# Patient Record
Sex: Male | Born: 1945
Health system: Southern US, Community
[De-identification: ages and names within clinical notes are randomized; demographics above are authoritative.]

## PROBLEM LIST (undated history)

## (undated) DIAGNOSIS — I679 Cerebrovascular disease, unspecified: Secondary | ICD-10-CM

## (undated) DIAGNOSIS — I639 Cerebral infarction, unspecified: Secondary | ICD-10-CM

## (undated) DIAGNOSIS — R51 Headache: Secondary | ICD-10-CM

## (undated) HISTORY — DX: Cerebral infarction, unspecified: I63.9

## (undated) HISTORY — DX: Cerebrovascular disease, unspecified: I67.9

## (undated) HISTORY — DX: Headache: R51

## (undated) HISTORY — PX: HERNIA REPAIR: SHX51

---

## 2005-06-09 ENCOUNTER — Ambulatory Visit: Payer: Self-pay

## 2006-12-28 ENCOUNTER — Ambulatory Visit (HOSPITAL_COMMUNITY): Admission: RE | Admit: 2006-12-28 | Discharge: 2006-12-28 | Payer: Self-pay | Admitting: General Surgery

## 2013-04-16 ENCOUNTER — Telehealth: Payer: Self-pay | Admitting: Neurology

## 2013-04-20 ENCOUNTER — Telehealth: Payer: Self-pay | Admitting: Neurology

## 2013-04-23 NOTE — Telephone Encounter (Signed)
See phone call 04/20/13 pt needs referral from PCP per [facility] hasn't been seen since 2006 by Dr. Sandria Manly. Thanks

## 2013-05-29 ENCOUNTER — Ambulatory Visit (INDEPENDENT_AMBULATORY_CARE_PROVIDER_SITE_OTHER): Payer: Medicare Other | Admitting: Family Medicine

## 2013-05-29 VITALS — BP 124/66 | HR 68 | Temp 98.8°F | Resp 18 | Ht 69.0 in | Wt 169.2 lb

## 2013-05-29 DIAGNOSIS — N529 Male erectile dysfunction, unspecified: Secondary | ICD-10-CM | POA: Diagnosis not present

## 2013-05-29 DIAGNOSIS — Z8673 Personal history of transient ischemic attack (TIA), and cerebral infarction without residual deficits: Secondary | ICD-10-CM

## 2013-05-29 MED ORDER — TADALAFIL 2.5 MG PO TABS: 2.5000 mg | ORAL_TABLET | Freq: Every day | ORAL | Status: DC

## 2013-05-29 NOTE — Progress Notes (Signed)
  Subjective:    Patient ID: Timothy Shields, male    DOB: November 28, 1945, 68 y.o.   MRN: [id number]  HPI 67 yo male presents today with a Low Libido. He was told by his wife at having difficulty having erections was a sign of imminent demise. He's had problems with his circulation the past with a stroke back in 82 and subsequently he's had several other smaller strokes. He would like a referral to a vascular specialist (Dr. Pearlean Brownie)  Review of Systems     Objective:   Physical Exam No acute distress HEENT: Unremarkable Neck: No bruits or thyromegaly, supple Chest: Clear Heart: Regular no murmur or gallop Abdomen soft nontender Femoral pulses are full without bruits there is no in the abdomen either Skin: No rashes       Assessment & Plan:  Erectile dysfunction - Plan: Tadalafil (CIALIS) 2.5 MG TABS  H/O: CVA (cerebrovascular accident) - Plan: Ambulatory referral to Vascular Surgery  Signed, Elvina Sidle, MD

## 2013-07-10 ENCOUNTER — Ambulatory Visit (INDEPENDENT_AMBULATORY_CARE_PROVIDER_SITE_OTHER): Payer: Medicare Other | Admitting: Neurology

## 2013-07-10 ENCOUNTER — Encounter (INDEPENDENT_AMBULATORY_CARE_PROVIDER_SITE_OTHER): Payer: Self-pay

## 2013-07-10 ENCOUNTER — Encounter: Payer: Self-pay | Admitting: Neurology

## 2013-07-10 VITALS — BP 111/72 | HR 66 | Temp 97.6°F | Ht 71.0 in | Wt 193.0 lb

## 2013-07-10 DIAGNOSIS — I6789 Other cerebrovascular disease: Secondary | ICD-10-CM | POA: Diagnosis not present

## 2013-07-10 DIAGNOSIS — E7849 Other hyperlipidemia: Secondary | ICD-10-CM

## 2013-07-10 DIAGNOSIS — E785 Hyperlipidemia, unspecified: Secondary | ICD-10-CM | POA: Diagnosis not present

## 2013-07-10 DIAGNOSIS — E1149 Type 2 diabetes mellitus with other diabetic neurological complication: Secondary | ICD-10-CM | POA: Diagnosis not present

## 2013-07-10 DIAGNOSIS — E782 Mixed hyperlipidemia: Secondary | ICD-10-CM | POA: Diagnosis not present

## 2013-07-10 NOTE — Progress Notes (Signed)
[facility] [address] [place]. [place] [address] [phone]       OFFICE CONSULT NOTE  Timothy. Timothy Shields Date of Birth:  [birth year] Medical Record Number:  [id number]   Referring MD:  Elvina Sidle  Reason for Referral:  History of strokes  HPI: Timothy Shields is a 67 year pleasant Caucasian male has remote history of 3 strokes going back to 1982 when he was only 67 years of age. He initially presented with right-sided ataxia and was found to have right posterior inferior cerebellar artery infarct. I do not have any of his prior records to review today. That same year he had some vision difficulties and was found to have left posterior cerebral artery infarct. He was seen at [facility] by Dr. Berdine Dance and was found to have a left PCA stenosis. He was placed on anticoagulation with warfarin and subsequently had followup catheter angiogram which showed resolution of the PCA stenosis and warfarin was discontinued. The etiology of his stroke was felt to be vasospasm of the PCA secondary to migraine. He was on aspirin and verapamil for several years and did well and eventually stopped verapamil due to constipation. On 03/29/04 while in [facility] [place] he developed right foot drop and weakness as well as right hand clumsiness without associated headache or visual changes. He was seen at [facility] in [facility] and MRI scan showed an acute left internal capsule infarct. Intra-and extracranial MRA were normal. Patient had hypercoagulable workup which was negative. He was continued on aspirin and verapamil. He has done well since then and has not had a recurrent stroke or TIA symptoms. He used to have some visual symptoms suggestive of migraine equivalents but he's not had them for several years now. He admits to mild short-term memory difficulties but denies any currents complaints. He takes aspirin only as needed and not on a regular basis. He has not had  any regular yearly health checkups or lab work. He was seeing Dr. Sandria Manly once a year and was referred to see me after his retirement.  ROS:   14 system review of systems is positive for  no complaints today  PMH:  Past Medical History  Diagnosis Date  . Stroke     3  . Headache(784.0)     Social History:  History   Social History  . Marital Status: Married    Spouse Name: N/A    Number of Children: 2  . Years of Education: MA   Occupational History  .  [employer]   Social History Main Topics  . Smoking status: Never Smoker   . Smokeless tobacco: Not on file  . Alcohol Use: 0.5 oz/week    1 drink(s) per week  . Drug Use: No  . Sexual Activity: Yes   Other Topics Concern  . Not on file   Social History Narrative  . No narrative on file    Medications:   Current Outpatient Prescriptions on File Prior to Visit  Medication Sig Dispense Refill  . Tadalafil (CIALIS) 2.5 MG TABS Take 1 tablet (2.5 mg total) by mouth daily.  30 each  5   No current facility-administered medications on file prior to visit.    Allergies:   Allergies  Allergen Reactions  . Penicillins Hives    Physical Exam General: well developed, well nourished, seated, in no evident distress Head: head normocephalic and atraumatic. Orohparynx benign Neck: supple with no carotid or supraclavicular bruits Cardiovascular:  regular rate and rhythm, no murmurs Musculoskeletal: no deformity Skin:  no rash/petichiae Vascular:  Normal pulses all extremities Filed Vitals:   07/10/13 1251  BP: 111/72  Pulse: 66  Temp: 97.6 F (36.4 C)    Neurologic Exam Mental Status: Awake and fully alert. Oriented to place and time. Recent and remote memory intact. Attention span, concentration and fund of knowledge appropriate. Mood and affect appropriate.  Cranial Nerves: Fundoscopic exam reveals sharp disc margins. Pupils equal, briskly reactive to light. Extraocular movements full without nystagmus. Visual fields  full to confrontation. Hearing intact. Facial sensation intact. Face, tongue, palate moves normally and symmetrically.  Motor: Normal bulk and tone. Normal strength in all tested extremity muscles. Sensory.: intact to touch and pinprick and vibratory sensation.  Coordination: Rapid alternating movements normal in all extremities. Finger-to-nose and heel-to-shin performed accurately bilaterally. Gait and Station: Arises from chair without difficulty. Stance is normal. Gait demonstrates normal stride length and balance . Able to heel, toe and tandem walk without difficulty.  Reflexes: 1+ and symmetric. Toes downgoing.   NIHSS  0 Modified Rankin  0  ASSESSMENT: 67 year caucasian male with remote history of 3 separate strokes- right posterior inferior cerebellar artery infarct as well as left posterior cerebral artery infarcts both  in 1982 followed by left internal capsule infarct in 2005. Etiology unclear most likely cryptogenic given absence of significant vascular risk factors and 2 of his strokes occurred when he was in his 30s. He has done well without any recurrent neurovascular symptoms in the last 9 years. No significant vascular risk factors except possibly mild hyperlipidemia.    PLAN: I had a long discussion with the patient with regards to his prior strokes and went over the results of the evaluation and answered questions. Since he is doing so well without any neurovascular symptoms for 9 years I would not recommend any further diagnostic workup for his cryptogenic etiology. If it does have episodes of palpitations or further there was a simple A. consider placement of loop recorder for of cisplatin positioned. I strongly encouraged him to take aspirin 325 mg daily as well as check fasting lipid profile, and hemoglobin A1c. Make start him on statin if his lipids are borderline or elevated. Also check complete metabolic panel and CBC as he has not had any lab work in recent years. Check a  screening carotid ultrasound yearly for carotid stenosis. Follow up in the future in 1 year or call earlier if needed.

## 2013-07-10 NOTE — Patient Instructions (Signed)
Start aspirin 325 mg daily. Exercise regularly 30 minutes 4-5 times per week. Check complete metabolic panel, CBC, hemoglobin A1c and fasting lipid profile. Followup once a year as needed

## 2013-07-26 ENCOUNTER — Encounter: Payer: Self-pay | Admitting: Family Medicine

## 2013-08-08 ENCOUNTER — Ambulatory Visit (INDEPENDENT_AMBULATORY_CARE_PROVIDER_SITE_OTHER): Payer: Medicare Other

## 2013-08-08 DIAGNOSIS — R0989 Other specified symptoms and signs involving the circulatory and respiratory systems: Secondary | ICD-10-CM

## 2013-08-08 DIAGNOSIS — I6789 Other cerebrovascular disease: Secondary | ICD-10-CM

## 2013-08-09 ENCOUNTER — Encounter: Payer: Medicare Other | Admitting: Family Medicine

## 2013-09-13 ENCOUNTER — Encounter: Payer: Self-pay | Admitting: Family Medicine

## 2013-09-13 ENCOUNTER — Ambulatory Visit (INDEPENDENT_AMBULATORY_CARE_PROVIDER_SITE_OTHER): Payer: Medicare Other | Admitting: Family Medicine

## 2013-09-13 VITALS — BP 120/70 | HR 60 | Temp 97.9°F | Resp 16 | Ht 69.0 in | Wt 191.4 lb

## 2013-09-13 DIAGNOSIS — Z139 Encounter for screening, unspecified: Secondary | ICD-10-CM | POA: Diagnosis not present

## 2013-09-13 DIAGNOSIS — N529 Male erectile dysfunction, unspecified: Secondary | ICD-10-CM | POA: Diagnosis not present

## 2013-09-13 DIAGNOSIS — Z0000 Encounter for general adult medical examination without abnormal findings: Secondary | ICD-10-CM | POA: Diagnosis not present

## 2013-09-13 LAB — POCT URINALYSIS DIPSTICK
Bilirubin, UA: NEGATIVE
Blood, UA: NEGATIVE
Glucose, UA: NEGATIVE
Ketones, UA: NEGATIVE
Leukocytes, UA: NEGATIVE
Nitrite, UA: NEGATIVE
Protein, UA: NEGATIVE
Spec Grav, UA: >=1.03
Urobilinogen, UA: 0.2
pH, UA: 6

## 2013-09-13 LAB — IFOBT (OCCULT BLOOD): IFOBT: NEGATIVE

## 2013-09-13 MED ORDER — TADALAFIL 2.5 MG PO TABS: 2.5000 mg | ORAL_TABLET | Freq: Every day | ORAL | Status: DC

## 2013-09-13 NOTE — Progress Notes (Signed)
   Subjective:    Patient ID: ELIGE SHOUSE, male    DOB: [birth year], 68 y.o.   MRN: [id number]  HPI Josef Tourigny is a 68 year old gentleman who now works half days. 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 He has copies of books such as Shakespeare and Darwin's Origin of the Species.  He had a cerebro vascular accident in 1982 thought to have arisen from a silent migraine syndrome. He's had several subsequent episodes but none in recent years. His residual deficits include right upper quadrant visual loss, right leg turning out, and slightly weak right grasp. He was originally treated with Coumadin but this made him depressed and he subsequently switched over to plain aspirin. He was seen by [facility] in consultation with Dr. love, who retired recently and he is now being seen by Dr. Willaim Rayas.  He is due for a colonoscopy. He has not made arrangements with Dr. Fuller Plan yet.  Review of Systems  Constitutional: Negative.   HENT: Negative.   Eyes: Negative.   Respiratory: Negative.   Cardiovascular: Negative.   Gastrointestinal: Negative.   Endocrine: Negative.   Genitourinary: Negative.   Musculoskeletal: Negative.   Skin: Negative.   Allergic/Immunologic: Positive for environmental allergies.  Neurological: Negative.   Hematological: Negative.   Psychiatric/Behavioral: Negative.        Objective:   Physical Exam HEENT: Normal with good fundi, clear ear canals are normal TMs, normal oropharynx. Neck: No bruit, no adenopathy, no thyromegaly Chest: Clear Heart: Regular no murmur Abdomen: Soft nontender without HSM or masses. He does have a very small umbilical hernia Genitalia: Circumcised male with no hernia, scrotal mass. Rectal exam: Weekened sphincter, several small hemorrhoids, diffusely enlarged and smooth prostate. Extremities: Good pulses, no edema Results for orders placed in visit  on 09/13/13  IFOBT (OCCULT BLOOD)      Result Value Range   IFOBT Negative    POCT URINALYSIS DIPSTICK      Result Value Range   Color, UA yellow     Clarity, UA clear     Glucose, UA neg     Bilirubin, UA neg     Ketones, UA neg     Spec Grav, UA >=1.030     Blood, UA neg     pH, UA 6.0     Protein, UA neg     Urobilinogen, UA 0.2     Nitrite, UA neg     Leukocytes, UA Negative         Assessment & Plan:  Patient is due for a colonoscopy. He'll be having his routine lab done by Dr. Willaim Rayas.  I asked patient to include comprehensive metabolic profile, PSA, lipid profile, CBC, sedimentation rate when he has his lab done with Dr. Willaim Rayas.  Routine general medical examination at a health care facility - Plan: IFOBT POC (occult bld, rslt in office), POCT urinalysis dipstick  Erectile dysfunction - Plan: Tadalafil (CIALIS) 2.5 MG TABS   Signed, Carola Frost.D.

## 2013-10-09 ENCOUNTER — Other Ambulatory Visit (INDEPENDENT_AMBULATORY_CARE_PROVIDER_SITE_OTHER): Payer: Self-pay

## 2013-10-09 ENCOUNTER — Other Ambulatory Visit: Payer: Self-pay | Admitting: *Deleted

## 2013-10-09 DIAGNOSIS — Z125 Encounter for screening for malignant neoplasm of prostate: Secondary | ICD-10-CM

## 2013-10-09 DIAGNOSIS — N529 Male erectile dysfunction, unspecified: Secondary | ICD-10-CM

## 2013-10-09 DIAGNOSIS — Z0000 Encounter for general adult medical examination without abnormal findings: Secondary | ICD-10-CM

## 2013-10-09 DIAGNOSIS — Z0289 Encounter for other administrative examinations: Secondary | ICD-10-CM

## 2013-10-10 ENCOUNTER — Other Ambulatory Visit (INDEPENDENT_AMBULATORY_CARE_PROVIDER_SITE_OTHER): Payer: Self-pay

## 2013-10-10 DIAGNOSIS — Z0289 Encounter for other administrative examinations: Secondary | ICD-10-CM

## 2013-10-10 DIAGNOSIS — E782 Mixed hyperlipidemia: Secondary | ICD-10-CM | POA: Diagnosis not present

## 2013-10-10 DIAGNOSIS — Z125 Encounter for screening for malignant neoplasm of prostate: Secondary | ICD-10-CM

## 2013-10-10 DIAGNOSIS — E1149 Type 2 diabetes mellitus with other diabetic neurological complication: Secondary | ICD-10-CM | POA: Diagnosis not present

## 2013-10-10 DIAGNOSIS — I6789 Other cerebrovascular disease: Secondary | ICD-10-CM | POA: Diagnosis not present

## 2013-10-11 LAB — COMPREHENSIVE METABOLIC PANEL
ALT: 14 IU/L (ref 0–44)
AST: 18 IU/L (ref 0–40)
Albumin/Globulin Ratio: 1.8 (ref 1.1–2.5)
Albumin: 4.4 g/dL (ref 3.6–4.8)
Alkaline Phosphatase: 59 IU/L (ref 39–117)
BUN/Creatinine Ratio: 23 — ABNORMAL HIGH (ref 10–22)
BUN: 26 mg/dL (ref 8–27)
CO2: 24 mmol/L (ref 18–29)
Calcium: 9.5 mg/dL (ref 8.6–10.2)
Chloride: 103 mmol/L (ref 97–108)
Creatinine, Ser: 1.15 mg/dL (ref 0.76–1.27)
GFR calc Af Amer: 75 mL/min/{1.73_m2} (ref >59)
GFR calc non Af Amer: 65 mL/min/{1.73_m2} (ref >59)
Globulin, Total: 2.5 g/dL (ref 1.5–4.5)
Glucose: 84 mg/dL (ref 65–99)
Potassium: 4.9 mmol/L (ref 3.5–5.2)
Sodium: 143 mmol/L (ref 134–144)
Total Bilirubin: 1 mg/dL (ref 0.0–1.2)
Total Protein: 6.9 g/dL (ref 6.0–8.5)

## 2013-10-11 LAB — LIPID PANEL
Chol/HDL Ratio: 3.1 ratio units (ref 0.0–5.0)
Cholesterol, Total: 198 mg/dL (ref 100–199)
HDL: 63 mg/dL (ref >39)
LDL Calculated: 122 mg/dL — ABNORMAL HIGH (ref 0–99)
Triglycerides: 63 mg/dL (ref 0–149)
VLDL Cholesterol Cal: 13 mg/dL (ref 5–40)

## 2013-10-11 LAB — CBC
HCT: 44.9 % (ref 37.5–51.0)
Hemoglobin: 15.5 g/dL (ref 12.6–17.7)
MCH: 33 pg (ref 26.6–33.0)
MCHC: 34.5 g/dL (ref 31.5–35.7)
MCV: 96 fL (ref 79–97)
Platelets: 205 10*3/uL (ref 150–379)
RBC: 4.69 x10E6/uL (ref 4.14–5.80)
RDW: 13.6 % (ref 12.3–15.4)
WBC: 3.5 10*3/uL (ref 3.4–10.8)

## 2013-10-11 LAB — PSA: PSA: 1.2 ng/mL (ref 0.0–4.0)

## 2013-10-11 LAB — HEMOGLOBIN A1C
Est. average glucose Bld gHb Est-mCnc: 123 mg/dL
Hgb A1c MFr Bld: 5.9 % — ABNORMAL HIGH (ref 4.8–5.6)

## 2013-10-19 ENCOUNTER — Telehealth: Payer: Self-pay | Admitting: *Deleted

## 2013-10-19 NOTE — Telephone Encounter (Signed)
Called patient gave blood work results and doppler sent results to primary.

## 2013-10-22 ENCOUNTER — Encounter: Payer: Self-pay | Admitting: *Deleted

## 2013-12-12 ENCOUNTER — Encounter: Payer: Self-pay | Admitting: Family Medicine

## 2014-01-10 NOTE — Telephone Encounter (Signed)
Closing encounter

## 2014-01-21 ENCOUNTER — Telehealth: Payer: Self-pay | Admitting: Family Medicine

## 2014-01-21 DIAGNOSIS — N529 Male erectile dysfunction, unspecified: Secondary | ICD-10-CM

## 2014-01-21 NOTE — Telephone Encounter (Signed)
Patient needs a refill on Cialis 5mg . [facility] [place]  [phone] extension [phone]

## 2014-01-23 MED ORDER — TADALAFIL 5 MG PO TABS: 5.0000 mg | ORAL_TABLET | Freq: Every day | ORAL | Status: DC | PRN

## 2014-01-23 NOTE — Telephone Encounter (Signed)
Patient had been taking 2.5 mg but I will fill 5 mg tabs as requested

## 2014-03-21 DIAGNOSIS — D1739 Benign lipomatous neoplasm of skin and subcutaneous tissue of other sites: Secondary | ICD-10-CM | POA: Diagnosis not present

## 2014-03-21 DIAGNOSIS — D239 Other benign neoplasm of skin, unspecified: Secondary | ICD-10-CM | POA: Diagnosis not present

## 2014-03-21 DIAGNOSIS — L821 Other seborrheic keratosis: Secondary | ICD-10-CM | POA: Diagnosis not present

## 2014-03-21 DIAGNOSIS — L819 Disorder of pigmentation, unspecified: Secondary | ICD-10-CM | POA: Diagnosis not present

## 2014-11-25 ENCOUNTER — Other Ambulatory Visit: Payer: Self-pay | Admitting: Family Medicine

## 2014-11-27 NOTE — Telephone Encounter (Signed)
It has been over a yr since you saw pt. Do you want to deny?

## 2015-11-29 ENCOUNTER — Other Ambulatory Visit: Payer: Self-pay | Admitting: Family Medicine

## 2015-12-18 ENCOUNTER — Other Ambulatory Visit: Payer: Self-pay | Admitting: Family Medicine

## 2015-12-26 ENCOUNTER — Ambulatory Visit (INDEPENDENT_AMBULATORY_CARE_PROVIDER_SITE_OTHER): Payer: Medicare Other | Admitting: Family Medicine

## 2015-12-26 VITALS — BP 122/76 | HR 60 | Temp 98.0°F | Resp 18 | Wt 205.0 lb

## 2015-12-26 DIAGNOSIS — Z23 Encounter for immunization: Secondary | ICD-10-CM | POA: Diagnosis not present

## 2015-12-26 DIAGNOSIS — Z0000 Encounter for general adult medical examination without abnormal findings: Secondary | ICD-10-CM

## 2015-12-26 DIAGNOSIS — N529 Male erectile dysfunction, unspecified: Secondary | ICD-10-CM

## 2015-12-26 MED ORDER — TADALAFIL 5 MG PO TABS: 5.0000 mg | ORAL_TABLET | Freq: Every day | ORAL | Status: DC | PRN

## 2015-12-26 MED ORDER — ZOSTER VACCINE LIVE 19400 UNT/0.65ML ~~LOC~~ SOLR: 0.6500 mL | Freq: Once | SUBCUTANEOUS | Status: DC

## 2015-12-26 NOTE — Addendum Note (Signed)
Addended by: Robyn Haber on: 12/26/2015 07:32 PM   Modules accepted: Orders

## 2015-12-26 NOTE — Patient Instructions (Addendum)

## 2015-12-26 NOTE — Progress Notes (Addendum)
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 He's going to be seeing Dr. Dagmar Hait for his medical care in the future.  Patient has an aversion to medical care. I have referred him for colonoscopy in the past and he's refused to go. He's about to go on a trip to [place] and said at this point he would consider doing the colonoscopy when he gets back.  Patient needs a refill on his Cialis.  I talked about patient's past medical history of having had a stroke with some residual effects of having a right upper quadrant field cut and his vision as well as his right foot turning out. He's exercising twice a week although his lead is weight it away from him a little bit in the last year. He's walking 10,000 steps a day now. He's going to be working on losing weight.  I try to talk patient into getting a tetanus shot and a pneumovax today but he only would accept the pneumonia vaccine. He said he'll get a shingles vaccine at the pharmacy so I prescribed that as well.  I spoke with him about cholesterol screening.  He said he would never take a statin.  He hopes to shed 10 lbs.  He refused the PSA screening.  He said he would get his eyes checked for glaucoma.  He has not seen an ophthalmologist in years.  He has not had follow up for his stroke since Dr. Erling Cruz retired.  Patient is a first 2 taking cholesterol medication.  Objective:  Cheerful, articulate BP 122/76 mmHg  Pulse 60  Temp(Src) 98 F (36.7 C) (Oral)  Resp 18  Wt 205 lb (92.987 kg)  SpO2 96% He has a right upper field cut.  Good strength in both upper extremities and gait is normal  Assessment:  Patient is somewhat phobic of needles and does not trust the medical profession to guide him on preventive care.  Erectile dysfunction, unspecified erectile dysfunction type - Plan: tadalafil (CIALIS) 5 MG tablet  Routine general medical examination at a health care facility - Plan: Ambulatory referral to  Gastroenterology, Pneumococcal conjugate vaccine 13-valent IM  Need for prophylactic vaccination against Streptococcus pneumoniae (pneumococcus) - Plan: Pneumococcal conjugate vaccine 13-valent IM  Need for shingles vaccine - Plan: zoster vaccine live, PF, (ZOSTAVAX) 09811 UNT/0.65ML injection  I suggested he see Dr. Dagmar Hait to review appropriate labs since he may have a different menu than what I would recommend and he has such an aversion to needles.  Robyn Haber M.D.

## 2016-03-31 ENCOUNTER — Encounter: Payer: Self-pay | Admitting: Family Medicine

## 2016-08-17 DIAGNOSIS — I639 Cerebral infarction, unspecified: Secondary | ICD-10-CM | POA: Diagnosis not present

## 2016-08-17 DIAGNOSIS — E784 Other hyperlipidemia: Secondary | ICD-10-CM | POA: Diagnosis not present

## 2016-08-17 DIAGNOSIS — F5221 Male erectile disorder: Secondary | ICD-10-CM | POA: Diagnosis not present

## 2016-08-17 DIAGNOSIS — Z6828 Body mass index (BMI) 28.0-28.9, adult: Secondary | ICD-10-CM | POA: Diagnosis not present

## 2016-08-17 DIAGNOSIS — Z1389 Encounter for screening for other disorder: Secondary | ICD-10-CM | POA: Diagnosis not present

## 2016-12-07 DIAGNOSIS — Z125 Encounter for screening for malignant neoplasm of prostate: Secondary | ICD-10-CM | POA: Diagnosis not present

## 2016-12-07 DIAGNOSIS — E784 Other hyperlipidemia: Secondary | ICD-10-CM | POA: Diagnosis not present

## 2016-12-07 DIAGNOSIS — Z0000 Encounter for general adult medical examination without abnormal findings: Secondary | ICD-10-CM | POA: Diagnosis not present

## 2016-12-14 DIAGNOSIS — F5221 Male erectile disorder: Secondary | ICD-10-CM | POA: Diagnosis not present

## 2016-12-14 DIAGNOSIS — Z0000 Encounter for general adult medical examination without abnormal findings: Secondary | ICD-10-CM | POA: Diagnosis not present

## 2016-12-14 DIAGNOSIS — Z1389 Encounter for screening for other disorder: Secondary | ICD-10-CM | POA: Diagnosis not present

## 2016-12-14 DIAGNOSIS — Z6828 Body mass index (BMI) 28.0-28.9, adult: Secondary | ICD-10-CM | POA: Diagnosis not present

## 2016-12-14 DIAGNOSIS — I639 Cerebral infarction, unspecified: Secondary | ICD-10-CM | POA: Diagnosis not present

## 2016-12-14 DIAGNOSIS — E784 Other hyperlipidemia: Secondary | ICD-10-CM | POA: Diagnosis not present

## 2016-12-16 DIAGNOSIS — Z1212 Encounter for screening for malignant neoplasm of rectum: Secondary | ICD-10-CM | POA: Diagnosis not present

## 2016-12-24 DIAGNOSIS — Z1211 Encounter for screening for malignant neoplasm of colon: Secondary | ICD-10-CM | POA: Diagnosis not present

## 2016-12-24 DIAGNOSIS — Z1212 Encounter for screening for malignant neoplasm of rectum: Secondary | ICD-10-CM | POA: Diagnosis not present

## 2017-03-15 ENCOUNTER — Encounter: Payer: Self-pay | Admitting: Internal Medicine

## 2017-11-14 ENCOUNTER — Other Ambulatory Visit: Payer: Self-pay | Admitting: Radiology

## 2019-07-18 DIAGNOSIS — H2511 Age-related nuclear cataract, right eye: Secondary | ICD-10-CM | POA: Diagnosis not present

## 2019-07-18 DIAGNOSIS — H25812 Combined forms of age-related cataract, left eye: Secondary | ICD-10-CM | POA: Diagnosis not present

## 2019-10-03 ENCOUNTER — Ambulatory Visit: Payer: Self-pay

## 2019-10-12 ENCOUNTER — Ambulatory Visit: Payer: Medicare Other | Attending: Internal Medicine

## 2019-10-12 DIAGNOSIS — Z23 Encounter for immunization: Secondary | ICD-10-CM

## 2019-10-12 NOTE — Progress Notes (Signed)
   Covid-19 Vaccination Clinic  Name:  Timothy Shields    MRN: [id number] DOB: 1946/03/16  10/12/2019  Timothy Shields was observed post Covid-19 immunization for 15 minutes without incidence. He was provided with Vaccine Information Sheet and instruction to access the V-Safe system.   Timothy Shields was instructed to call 911 with any severe reactions post vaccine: Marland Kitchen Difficulty breathing  . Swelling of your face and throat  . A fast heartbeat  . A bad rash all over your body  . Dizziness and weakness    Immunizations Administered    Name Date Dose VIS Date Route   Pfizer COVID-19 Vaccine 10/12/2019 10:57 AM 0.3 mL 08/17/2019 Intramuscular   Manufacturer: [place]   Lot: [id number]   [place]: [id number]

## 2019-10-24 ENCOUNTER — Ambulatory Visit: Payer: Self-pay

## 2019-11-06 ENCOUNTER — Ambulatory Visit: Payer: Medicare Other | Attending: Internal Medicine

## 2019-11-06 DIAGNOSIS — Z23 Encounter for immunization: Secondary | ICD-10-CM | POA: Insufficient documentation

## 2019-11-06 NOTE — Progress Notes (Signed)
   Covid-19 Vaccination Clinic  Name:  Timothy Shields    MRN: [id number] DOB: 04/08/1946  11/06/2019  Timothy Shields was observed post Covid-19 immunization for 15 minutes without incident. He was provided with Vaccine Information Sheet and instruction to access the V-Safe system.   [name] was instructed to call 911 with any severe reactions post vaccine: Marland Kitchen Difficulty breathing  . Swelling of face and throat  . A fast heartbeat  . A bad rash all over body  . Dizziness and weakness   Immunizations Administered    Name Date Dose VIS Date Route   Pfizer COVID-19 Vaccine 11/06/2019 10:43 AM 0.3 mL 08/17/2019 Intramuscular   Manufacturer: [place]   Lot: [place]   [place]: [id number]

## 2020-06-26 ENCOUNTER — Encounter: Payer: Self-pay | Admitting: Neurology

## 2020-06-26 ENCOUNTER — Ambulatory Visit (INDEPENDENT_AMBULATORY_CARE_PROVIDER_SITE_OTHER): Payer: Medicare Other | Admitting: Neurology

## 2020-06-26 ENCOUNTER — Telehealth: Payer: Self-pay | Admitting: Neurology

## 2020-06-26 VITALS — BP 142/90 | HR 66 | Ht 70.0 in | Wt 186.2 lb

## 2020-06-26 DIAGNOSIS — R269 Unspecified abnormalities of gait and mobility: Secondary | ICD-10-CM | POA: Diagnosis not present

## 2020-06-26 DIAGNOSIS — R413 Other amnesia: Secondary | ICD-10-CM | POA: Diagnosis not present

## 2020-06-26 DIAGNOSIS — Z8673 Personal history of transient ischemic attack (TIA), and cerebral infarction without residual deficits: Secondary | ICD-10-CM

## 2020-06-26 NOTE — Patient Instructions (Signed)
I had a long discussion with the patient with regards to his complaints of memory loss and gait difficulty with dragging of his right leg as well as remote history of strokes and answered questions.  I recommend we do further evaluation by checking memory panel labs, neuropathy panel labs, EMG nerve conduction study, MRI of the brain and cervical spine and check lipid profile and hemoglobin A1c.  I recommend he start aspirin 81 mg daily for stroke prevention and maintain aggressive risk factor modification with strict control of hypertension with blood pressure goal below 130/90, lipids with LDL cholesterol goal below 70 mg percent and diabetes with hemoglobin A1c goal below 6.5%.  I encouraged him to increase participation in cognitively challenging activities like solving crossword puzzles, playing bridge and sodoku.  We also discussed memory compensation strategies.  He will return for follow-up in the future in 2 months or call earlier if necessary. Memory Compensation Strategies  1. Use "WARM" strategy.  W= write it down  A= associate it  R= repeat it  M= make a mental note  2.   You can keep a Social worker.  Use a 3-ring notebook with sections for the following: calendar, important names and phone numbers,  medications, doctors' names/phone numbers, lists/reminders, and a section to journal what you did  each day.   3.    Use a calendar to write appointments down.  4.    Write yourself a schedule for the day.  This can be placed on the calendar or in a separate section of the Memory Notebook.  Keeping a  regular schedule can help memory.  5.    Use medication organizer with sections for each day or morning/evening pills.  You may need help loading it  6.    Keep a basket, or pegboard by the door.  Place items that you need to take out with you in the basket or on the pegboard.  You may also want to  include a message board for reminders.  7.    Use sticky notes.  Place sticky notes  with reminders in a place where the task is performed.  For example: " turn off the  stove" placed by the stove, "lock the door" placed on the door at eye level, " take your medications" on  the bathroom mirror or by the place where you normally take your medications.  8.    Use alarms/timers.  Use while cooking to remind yourself to check on food or as a reminder to take your medicine, or as a  reminder to make a call, or as a reminder to perform another task, etc.
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
## 2020-06-26 NOTE — Telephone Encounter (Signed)
Medicare/bcbs supp order sent to GI. No auth they will reach out to the patient to schedule.  

## 2020-06-27 LAB — NEUROPATHY PANEL: Total Protein: 7.1 g/dL (ref 6.0–8.5)

## 2020-06-27 LAB — RPR: RPR Ser Ql: NONREACTIVE

## 2020-06-27 NOTE — Progress Notes (Signed)
Kindly inform the patient that all the lab work is not back yet but vitamin D levels are low and cholesterol profile is not satisfactory and to see primary care physician for vitamin D replacement and cholesterol medication.

## 2020-06-30 LAB — NEUROPATHY PANEL
A/G Ratio: 1.3 (ref 0.7–1.7)
Albumin ELP: 4 g/dL (ref 2.9–4.4)
Alpha 1: 0.2 g/dL (ref 0.0–0.4)
Alpha 2: 0.6 g/dL (ref 0.4–1.0)
Angio Convert Enzyme: 39 U/L (ref 14–82)
Anti Nuclear Antibody (ANA): NEGATIVE
Beta: 0.9 g/dL (ref 0.7–1.3)
Gamma Globulin: 1.4 g/dL (ref 0.4–1.8)
Globulin, Total: 3.1 g/dL (ref 2.2–3.9)
Rhuematoid fact SerPl-aCnc: <10 IU/mL (ref 0.0–13.9)
Sed Rate: 15 mm/hr (ref 0–30)
TSH: 1.75 u[IU]/mL (ref 0.450–4.500)
Vit D, 25-Hydroxy: 24.8 ng/mL — ABNORMAL LOW (ref 30.0–100.0)
Vitamin B-12: 299 pg/mL (ref 232–1245)

## 2020-06-30 LAB — LIPID PANEL
Chol/HDL Ratio: 3.7 ratio (ref 0.0–5.0)
Cholesterol, Total: 214 mg/dL — ABNORMAL HIGH (ref 100–199)
HDL: 58 mg/dL (ref >39)
LDL Chol Calc (NIH): 144 mg/dL — ABNORMAL HIGH (ref 0–99)
Triglycerides: 66 mg/dL (ref 0–149)
VLDL Cholesterol Cal: 12 mg/dL (ref 5–40)

## 2020-06-30 LAB — HEMOGLOBIN A1C
Est. average glucose Bld gHb Est-mCnc: 111 mg/dL
Hgb A1c MFr Bld: 5.5 % (ref 4.8–5.6)

## 2020-07-02 ENCOUNTER — Telehealth: Payer: Self-pay | Admitting: Emergency Medicine

## 2020-07-02 NOTE — Telephone Encounter (Signed)
LVM on patient's mobile number.  Home number is disconnected and work number had voice mail full.

## 2020-07-02 NOTE — Telephone Encounter (Signed)
-----   Message from Garvin Fila, MD sent at 06/27/2020 12:25 PM EDT ----- Mitchell Heir inform the patient that all the lab work is not back yet but vitamin D levels are low and cholesterol profile is not satisfactory and to see primary care physician for vitamin D replacement and cholesterol medication.

## 2020-07-03 ENCOUNTER — Telehealth: Payer: Self-pay | Admitting: Emergency Medicine

## 2020-07-03 NOTE — Telephone Encounter (Signed)
Called patient and discussed Dr. Clydene Fake review of blood work.  Patient was able to verbalize his Vitamin D level was low and his cholesterol panel was not WNL.  Patient stated he would call his PCP and make an appointment to have this managed.    Patient verbalized appreciation.

## 2020-07-03 NOTE — Telephone Encounter (Signed)
Pt. returned [place], RN call. Please call.

## 2020-07-03 NOTE — Telephone Encounter (Signed)
Called patient and discussed Dr. Clydene Fake review of blood work and recommendations.  Patient denied any questions and verbalized undertanding to call his PCP for management of low Vitamin D and cholesterol.  Patient expressed appreciation.

## 2020-07-03 NOTE — Telephone Encounter (Signed)
-----   Message from Garvin Fila, MD sent at 06/27/2020 12:25 PM EDT ----- Mitchell Heir inform the patient that all the lab work is not back yet but vitamin D levels are low and cholesterol profile is not satisfactory and to see primary care physician for vitamin D replacement and cholesterol medication.

## 2020-07-08 ENCOUNTER — Telehealth: Payer: Self-pay | Admitting: Emergency Medicine

## 2020-07-08 NOTE — Progress Notes (Signed)
Kindly inform the patient that lab work for inflammatory and vasculitic causes of neuropathy was all normal

## 2020-07-08 NOTE — Telephone Encounter (Signed)
Called and spoke to patient regarding lab results.  Patient had no questions and expressed appreciation.

## 2020-07-08 NOTE — Telephone Encounter (Signed)
-----   Message from Garvin Fila, MD sent at 07/08/2020  8:37 AM EDT ----- Mitchell Heir inform the patient that lab work for inflammatory and vasculitic causes of neuropathy was all normal

## 2020-07-10 ENCOUNTER — Encounter (INDEPENDENT_AMBULATORY_CARE_PROVIDER_SITE_OTHER): Payer: Medicare Other | Admitting: Diagnostic Neuroimaging

## 2020-07-10 ENCOUNTER — Other Ambulatory Visit: Payer: Self-pay

## 2020-07-10 ENCOUNTER — Ambulatory Visit (INDEPENDENT_AMBULATORY_CARE_PROVIDER_SITE_OTHER): Payer: Medicare Other | Admitting: Diagnostic Neuroimaging

## 2020-07-10 DIAGNOSIS — Z0289 Encounter for other administrative examinations: Secondary | ICD-10-CM

## 2020-07-10 DIAGNOSIS — R269 Unspecified abnormalities of gait and mobility: Secondary | ICD-10-CM

## 2020-07-10 DIAGNOSIS — R2689 Other abnormalities of gait and mobility: Secondary | ICD-10-CM

## 2020-07-10 NOTE — Procedures (Signed)
[facility]  NCS (NERVE CONDUCTION STUDY) WITH EMG (ELECTROMYOGRAPHY) REPORT   STUDY DATE: 07/10/20 PATIENT NAME: Timothy Shields DOB: [birth year] MRN: [id number]  ORDERING CLINICIAN: Antony Contras, MD   TECHNOLOGIST: Sherre Scarlet ELECTROMYOGRAPHER: Earlean Polka. Pacey Altizer, MD  CLINICAL INFORMATION: 74 year old male with lower extremity weakness.  FINDINGS: NERVE CONDUCTION STUDY:  Bilateral peroneal and tibial motor responses are normal.  Bilateral sural and superficial peroneal sensory responses are normal.  Bilateral tibial F wave latencies are borderline prolonged.   NEEDLE ELECTROMYOGRAPHY:  Needle examination of right lower extremity is normal.    IMPRESSION:   This study demonstrates: -Overall unremarkable nerve conduction study and needle EMG.  No underlying evidence of large fiber polyneuropathy.    INTERPRETING PHYSICIAN:  Penni Bombard, MD Certified in Neurology, Neurophysiology and Neuroimaging  [facility] [address] [place], [place] [address] [phone]  [facility]    Nerve / Sites Muscle Latency Ref. Amplitude Ref. Rel Amp Segments Distance Velocity Ref. Area    ms ms mV mV %  cm m/s m/s mVms  R Peroneal - EDB     Ankle EDB 5.2 ?6.5 5.3 ?2.0 100 Ankle - EDB 9   19.5     Fib head EDB 12.0  4.2  78.9 Fib head - Ankle 30 44 ?44 17.4     Pop fossa EDB 14.3  3.4  81.2 Pop fossa - Fib head 10 44 ?44 14.7         Pop fossa - Ankle      L Peroneal - EDB     Ankle EDB 5.3 ?6.5 5.8 ?2.0 100 Ankle - EDB 9   20.2     Fib head EDB 12.1  4.4  75.8 Fib head - Ankle 30 44 ?44 17.7     Pop fossa EDB 14.4  4.3  96.7 Pop fossa - Fib head 10 44 ?44 18.4         Pop fossa - Ankle      R Tibial - AH     Ankle AH 5.5 ?5.8 5.0 ?4.0 100 Ankle - AH 9   15.1     Pop fossa AH 15.2  4.3  85.2 Pop fossa - Ankle 40 41 ?41 16.9  L Tibial - AH     Ankle AH 4.7 ?5.8 8.6 ?4.0 100 Ankle - AH 9   24.6     Pop fossa AH 14.5  7.1   82.7 Pop fossa - Ankle 40 41 ?41 26.1             SNC    Nerve / Sites Rec. Site Peak Lat Ref.  Amp Ref. Segments Distance    ms ms V V  cm  R Sural - Ankle (Calf)     Calf Ankle 4.3 ?4.4 14 ?6 Calf - Ankle 14  L Sural - Ankle (Calf)     Calf Ankle 4.4 ?4.4 13 ?6 Calf - Ankle 14  R Superficial peroneal - Ankle     Lat leg Ankle 4.2 ?4.4 7 ?6 Lat leg - Ankle 14  L Superficial peroneal - Ankle     Lat leg Ankle 4.3 ?4.4 6 ?6 Lat leg - Ankle 14             F  Wave    Nerve F Lat Ref.   ms ms  R Tibial - AH 59.7 ?56.0  L Tibial - AH 57.0 ?56.0  EMG Summary Table    Spontaneous MUAP Recruitment  Muscle IA Fib PSW Fasc Other Amp Dur. Poly Pattern  R. Vastus medialis Normal None None None _______ Normal Normal Normal Normal  R. Tibialis anterior Normal None None None _______ Normal Normal Normal Normal  R. Gastrocnemius (Medial head) Normal None None None _______ Normal Normal Normal Normal

## 2020-07-17 NOTE — Progress Notes (Signed)
Kindly inform the patient that EMG nerve conduction study was normal.  No evidence of pinched nerve or neuropathy

## 2020-07-22 ENCOUNTER — Encounter: Payer: Self-pay | Admitting: *Deleted

## 2020-11-10 DIAGNOSIS — M25561 Pain in right knee: Secondary | ICD-10-CM | POA: Diagnosis not present

## 2021-01-15 DIAGNOSIS — E785 Hyperlipidemia, unspecified: Secondary | ICD-10-CM | POA: Diagnosis not present

## 2021-01-15 DIAGNOSIS — Z125 Encounter for screening for malignant neoplasm of prostate: Secondary | ICD-10-CM | POA: Diagnosis not present

## 2021-01-22 DIAGNOSIS — I639 Cerebral infarction, unspecified: Secondary | ICD-10-CM | POA: Diagnosis not present

## 2021-01-22 DIAGNOSIS — Z1212 Encounter for screening for malignant neoplasm of rectum: Secondary | ICD-10-CM | POA: Diagnosis not present

## 2021-01-22 DIAGNOSIS — F5221 Male erectile disorder: Secondary | ICD-10-CM | POA: Diagnosis not present

## 2021-01-22 DIAGNOSIS — R859 Unspecified abnormal finding in specimens from digestive organs and abdominal cavity: Secondary | ICD-10-CM | POA: Diagnosis not present

## 2021-01-22 DIAGNOSIS — Z0000 Encounter for general adult medical examination without abnormal findings: Secondary | ICD-10-CM | POA: Diagnosis not present

## 2021-01-22 DIAGNOSIS — E785 Hyperlipidemia, unspecified: Secondary | ICD-10-CM | POA: Diagnosis not present

## 2021-05-13 DIAGNOSIS — Z23 Encounter for immunization: Secondary | ICD-10-CM | POA: Diagnosis not present

## 2021-05-26 DIAGNOSIS — Z23 Encounter for immunization: Secondary | ICD-10-CM | POA: Diagnosis not present

## 2021-07-06 DIAGNOSIS — M545 Low back pain, unspecified: Secondary | ICD-10-CM | POA: Diagnosis not present

## 2021-07-20 DIAGNOSIS — H25813 Combined forms of age-related cataract, bilateral: Secondary | ICD-10-CM | POA: Diagnosis not present

## 2021-09-10 ENCOUNTER — Encounter: Payer: Self-pay | Admitting: *Deleted

## 2021-09-11 ENCOUNTER — Ambulatory Visit (INDEPENDENT_AMBULATORY_CARE_PROVIDER_SITE_OTHER): Payer: Medicare Other | Admitting: Internal Medicine

## 2021-09-11 ENCOUNTER — Encounter: Payer: Self-pay | Admitting: Internal Medicine

## 2021-09-11 VITALS — BP 128/72 | HR 64 | Ht 69.0 in | Wt 187.0 lb

## 2021-09-11 DIAGNOSIS — R1110 Vomiting, unspecified: Secondary | ICD-10-CM

## 2021-09-11 DIAGNOSIS — K219 Gastro-esophageal reflux disease without esophagitis: Secondary | ICD-10-CM | POA: Diagnosis not present

## 2021-09-11 DIAGNOSIS — R0989 Other specified symptoms and signs involving the circulatory and respiratory systems: Secondary | ICD-10-CM

## 2021-09-11 MED ORDER — PANTOPRAZOLE SODIUM 40 MG PO TBEC: 40.0000 mg | DELAYED_RELEASE_TABLET | Freq: Every day | ORAL | 1 refills | Status: DC

## 2021-09-11 NOTE — Patient Instructions (Signed)
We have sent the following medications to your pharmacy for you to pick up at your convenience: Pantoprazole 40 mg once daily x 4-6 weeks.  Please send a Mychart message in approximately 1 month with an update on how your symptoms are after taking the pantoprazole.  If you are age 77 or older, your body mass index should be between 23-30. Your Body mass index is 27.62 kg/m. If this is out of the aforementioned range listed, please consider follow up with your Primary Care Provider.  If you are age 22 or younger, your body mass index should be between 19-25. Your Body mass index is 27.62 kg/m. If this is out of the aformentioned range listed, please consider follow up with your Primary Care Provider.   ________________________________________________________  The [facility] providers would like to encourage you to use [facility] to communicate with providers for non-urgent requests or questions.  Due to long hold times on the telephone, sending your provider a message by [facility] may be a faster and more efficient way to get a response.  Please allow 48 business hours for a response.  Please remember that this is for non-urgent requests.  _______________________________________________________ Due to recent changes in healthcare laws, you may see the results of your imaging and laboratory studies on MyChart before your provider has had a chance to review them.  We understand that in some cases there may be results that are confusing or concerning to you. Not all laboratory results come back in the same time frame and the provider may be waiting for multiple results in order to interpret others.  Please give [place] 48 hours in order for your provider to thoroughly review all the results before contacting the office for clarification of your results.

## 2021-09-11 NOTE — Progress Notes (Signed)
Patient ID: Timothy Shields, male   DOB: 24-Oct-1945, 76 y.o.   MRN: [id number] HPI: Timothy Shields is a 76 year old male with a history of stroke who was seen to evaluate throat clearing symptoms and intermittent nocturnal choking type symptoms.  He is here with his wife today.  He has minimal history though he states he will be seeing Dr. Leonie Man soon to be evaluated for possible Parkinson's disease.  He has not had this diagnosis of yet.  He reports that over the last few years he has noticed throat clearing and issues with mucus in the back of his throat.  He does not have a sore throat.  There is been no frequent cough.  He reports no issues with swallowing including no solid or liquid food dysphagia.  He does not feel heartburn or pyrosis.  He does intermittently have issues with hiccups perhaps more of late.  He is also had several incidences where he will wake up with a choking or coughing type symptom from sleep.  No abdominal pain.  No change in bowel movement.  No blood in stool or melena.  He reports his colorectal cancer screening has been done by primary care by using Cologuard.  He reports being up-to-date with this and his tests have been negative.  There is no family history of colorectal cancer.  His mother had throat cancer.  He has a son who is diabetic.  He is married with 2 adult children.  He worked in Estate manager/land agent.  Never a tobacco user.  Less than 1 alcoholic beverage per day on average.  Past Medical History:  Diagnosis Date   Cerebrovascular disease    Headache(784.0)    Stroke ([place])    3    Past Surgical History:  Procedure Laterality Date   HERNIA REPAIR     HERNIA REPAIR     Twice in the 1970's    Outpatient Medications Prior to Visit  Medication Sig Dispense Refill   aspirin EC 81 MG tablet Take 81 mg by mouth daily. Swallow whole.     tadalafil (CIALIS) 5 MG tablet Take 1 tablet (5 mg total) by mouth daily as needed for erectile dysfunction. 30  tablet 11   zoster vaccine live, PF, (ZOSTAVAX) 29562 UNT/0.65ML injection Inject 19,400 Units into the skin once. 0.65 mL 0   No facility-administered medications prior to visit.    Allergies  Allergen Reactions   Penicillins Hives    Family History  Problem Relation Age of Onset   Cancer Mother    Parkinson's disease Mother    Cancer Father    Heart disease Father    Diabetes Son    Cerebral palsy Son     Social History   Tobacco Use   Smoking status: Never   Smokeless tobacco: Never  Substance Use Topics   Alcohol use: Yes    Alcohol/week: 1.0 standard drink    Types: 1 Standard drinks or equivalent per week    Comment: 2-3 drinks   Drug use: No    ROS: As per history of present illness, otherwise negative  BP 128/72    Pulse 64    Ht 5\' 9"  (1.753 m)    Wt 187 lb (84.8 kg)    BMI 27.62 kg/m  Constitutional: Well-developed and well-nourished. No distress. HEENT: Normocephalic and atraumatic. Oropharynx is clear and moist.  No scleral icterus.  Neck is supple without masses. Cardiovascular: Normal rate, regular rhythm and intact distal pulses. No M/R/G  Pulmonary/chest: Effort normal and breath sounds normal. No wheezing, rales or rhonchi. Abdominal: Soft, nontender, nondistended. Bowel sounds active throughout. There are no masses palpable. No hepatosplenomegaly. Extremities: no clubbing, cyanosis, or edema Neurological: Alert and oriented to person place and time. Skin: Skin is warm and dry.  Psychiatric: Normal mood and affect. Behavior is normal.  ASSESSMENT/PLAN: 76 year old male with a history of stroke who was seen to evaluate throat clearing symptoms and intermittent nocturnal choking type symptoms.  Throat clearing/nocturnal regurgitation/LPR --we discussed his symptoms today and I am suspicious that they may relate to reflux disease specifically more LPR as he does not have esophageal symptoms.  We spent time today discussing how this can lead to  oropharyngeal irritation.  Without definitive alarm symptom I do not think we need to immediately perform upper endoscopy.  After discussing we will proceed as follows: --Trial of pantoprazole 40 mg once daily for 4 to 6 weeks; I asked him to reassess symptoms in about a month and send me a Scientist, research (medical).  If symptoms improved then diagnosis made. --If symptoms fail to improve consider barium esophagram/upper GI series which she would prefer less invasive maneuver before consideration of upper endoscopy  2.  Colorectal cancer screening --patient reports previous use of Cologuard for which she is up-to-date      Cc:[address], [place],  [place] [address]

## 2021-09-16 ENCOUNTER — Encounter: Payer: Self-pay | Admitting: Neurology

## 2021-09-16 ENCOUNTER — Other Ambulatory Visit: Payer: Self-pay

## 2021-09-16 ENCOUNTER — Ambulatory Visit (INDEPENDENT_AMBULATORY_CARE_PROVIDER_SITE_OTHER): Payer: Medicare Other | Admitting: Neurology

## 2021-09-16 ENCOUNTER — Telehealth: Payer: Self-pay | Admitting: Neurology

## 2021-09-16 VITALS — BP 118/82 | HR 69 | Ht 69.5 in | Wt 186.0 lb

## 2021-09-16 DIAGNOSIS — G214 Vascular parkinsonism: Secondary | ICD-10-CM

## 2021-09-16 DIAGNOSIS — R269 Unspecified abnormalities of gait and mobility: Secondary | ICD-10-CM | POA: Diagnosis not present

## 2021-09-16 DIAGNOSIS — G3184 Mild cognitive impairment, so stated: Secondary | ICD-10-CM | POA: Diagnosis not present

## 2021-09-16 DIAGNOSIS — R251 Tremor, unspecified: Secondary | ICD-10-CM | POA: Diagnosis not present
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
## 2021-09-16 NOTE — Telephone Encounter (Signed)
Medicare order sent to [facility], [facility] they will reach out to the patient to schedule.  °

## 2021-09-16 NOTE — Patient Instructions (Addendum)
I had a long discussion with the patient and his wife regarding his subacute symptoms of walking difficulty, tremors, hypophonia which possibly represented poststroke parkinsonism but do appear to be improving and not disabling at the present time to justify trial of medication.  Recommend further evaluation with checking MRI scan of the brain and cervical spine to rule out interval new strokes as well as compressive lesions.  Continue aspirin for stroke prevention and maintain aggressive risk factor modification strict control of hypertension blood pressure goal below 140/90, lipids with LDL cholesterol goal below 70 mg percent.  He will return for follow-up in 3 to 4 months or call earlier if necessary.

## 2021-09-17 ENCOUNTER — Other Ambulatory Visit: Payer: Self-pay | Admitting: Neurology

## 2021-09-17 ENCOUNTER — Telehealth: Payer: Self-pay | Admitting: Neurology

## 2021-09-17 DIAGNOSIS — R269 Unspecified abnormalities of gait and mobility: Secondary | ICD-10-CM

## 2021-09-17 NOTE — Telephone Encounter (Signed)
Melrose Nakayama, MD left a vm @ 2:47 stating pt would like a MRI of his lower back. Melrose Nakayama, MD has concerns pt has Spinal stenosis in addition to Parkinson's.   Dr Rhona Raider believes it is easier if Dr Leonie Man just orders the MRI instead of him, Dr Rhona Raider is asking to be called if there are questions or concerns.  He can be reached at [phone]

## 2021-09-18 ENCOUNTER — Telehealth: Payer: Self-pay | Admitting: Neurology

## 2021-09-18 NOTE — Telephone Encounter (Signed)
Medicare order sent to [facility], [facility] they will reach out to the patient to schedule.  °

## 2021-09-24 ENCOUNTER — Encounter: Payer: Self-pay | Admitting: Internal Medicine

## 2021-09-28 ENCOUNTER — Institutional Professional Consult (permissible substitution): Payer: Medicare Other | Admitting: Neurology

## 2021-10-03 ENCOUNTER — Other Ambulatory Visit: Payer: Self-pay | Admitting: Internal Medicine

## 2021-10-03 ENCOUNTER — Ambulatory Visit
Admission: RE | Admit: 2021-10-03 | Discharge: 2021-10-03 | Disposition: A | Discharge status: Home / Self Care | Payer: Medicare Other | Source: Ambulatory Visit | Attending: Neurology | Admitting: Neurology

## 2021-10-03 ENCOUNTER — Other Ambulatory Visit: Payer: Self-pay

## 2021-10-03 DIAGNOSIS — R269 Unspecified abnormalities of gait and mobility: Secondary | ICD-10-CM | POA: Diagnosis not present

## 2021-10-03 MED ORDER — GADOBENATE DIMEGLUMINE 529 MG/ML IV SOLN
17.0000 mL | Freq: Once | INTRAVENOUS | Status: AC | PRN
  Administered 2021-10-03: 17 mL via INTRAVENOUS

## 2021-10-05 ENCOUNTER — Telehealth: Payer: Self-pay | Admitting: Neurology

## 2021-10-05 NOTE — Telephone Encounter (Signed)
Pt called wanting to know when he will be called to discuss the MRI results. Please advise.

## 2021-10-06 ENCOUNTER — Encounter: Payer: Self-pay | Admitting: Internal Medicine

## 2021-10-06 ENCOUNTER — Telehealth: Payer: Self-pay | Admitting: Neurology

## 2021-10-06 NOTE — Telephone Encounter (Signed)
I attempted to reach the patient's wife again. Left message for her to return Dr. Clydene Fake call.  Timothy Shields - [phone]

## 2021-10-06 NOTE — Telephone Encounter (Signed)
I spoke to the patient and gave him results of the MRI scan of the brain showing no new abnormalities but old left PCA and right cerebellar infarcts and mild changes of small vessel disease.  MRI scan of the cervical spine shows spinal stenosis at C5-6 with moderate left-sided foraminal narrowing and MRI of the lumbar spine also shows mild degenerative changes.  Patient informed me that he had seen gastroenterologist Dr. Hilarie Fredrickson who gave him an antireflux medication which seems to have helped his swallowing.  I believe the patient's stiffness and gait difficulties are likely combination from his old strokes as well as spinal stenosis and does not have Parkinson's disease.  He did not recommend any changes in his neurological medications at this time.  He was advised to follow-up with me in 4 to 6 months or call earlier if necessary.  He voiced understanding

## 2021-10-07 NOTE — Progress Notes (Signed)
I spoke yesterday with the patient and his wife and communicated results of the brain MRI scan as well as spine MRIs and answered questions

## 2021-10-07 NOTE — Progress Notes (Signed)
I spoke to the patient yesterday and gave results of the MRI scan of the cervical and lumbar spine and answered questions.

## 2021-10-15 ENCOUNTER — Encounter: Payer: Self-pay | Admitting: Internal Medicine

## 2021-10-23 ENCOUNTER — Encounter: Payer: Self-pay | Admitting: Internal Medicine

## 2021-10-23 ENCOUNTER — Other Ambulatory Visit: Payer: Self-pay

## 2021-10-23 DIAGNOSIS — R1310 Dysphagia, unspecified: Secondary | ICD-10-CM

## 2021-10-23 NOTE — Telephone Encounter (Signed)
Please let patient know that I received his MyChart message  I have the following thoughts: I do think he should continue the pantoprazole 40 mg in the morning and famotidine 40 mg about 30 minutes before bedtime I recommend that we proceed with a barium esophagram with tablet to further evaluate for reflux and esophageal abnormalities; depending on the results of this we may consider upper endoscopy I feel that it would be okay to add the low-dose alprazolam/Xanax as prescribed by his primary care provider.  He should take this very close to bedtime as it can be sedating but it should help with his anxiousness and allow for better sleeping.  We will be back in touch with him once we get the barium esophagram results

## 2021-10-26 ENCOUNTER — Encounter: Payer: Self-pay | Admitting: Internal Medicine

## 2021-10-30 ENCOUNTER — Ambulatory Visit (HOSPITAL_COMMUNITY)
Admission: RE | Admit: 2021-10-30 | Discharge: 2021-10-30 | Disposition: A | Discharge status: Home / Self Care | Payer: Medicare Other | Source: Ambulatory Visit | Attending: Internal Medicine | Admitting: Internal Medicine

## 2021-10-30 ENCOUNTER — Encounter: Payer: Self-pay | Admitting: Internal Medicine

## 2021-10-30 ENCOUNTER — Other Ambulatory Visit: Payer: Self-pay

## 2021-10-30 DIAGNOSIS — K224 Dyskinesia of esophagus: Secondary | ICD-10-CM | POA: Diagnosis not present

## 2021-10-30 DIAGNOSIS — R1310 Dysphagia, unspecified: Secondary | ICD-10-CM | POA: Diagnosis not present

## 2021-10-31 ENCOUNTER — Other Ambulatory Visit: Payer: Self-pay | Admitting: Internal Medicine

## 2021-11-03 ENCOUNTER — Other Ambulatory Visit: Payer: Self-pay

## 2021-11-03 MED ORDER — PANTOPRAZOLE SODIUM 40 MG PO TBEC: 40.0000 mg | DELAYED_RELEASE_TABLET | Freq: Two times a day (BID) | ORAL | 3 refills | Status: DC

## 2021-11-06 DIAGNOSIS — R1310 Dysphagia, unspecified: Secondary | ICD-10-CM | POA: Diagnosis not present

## 2021-11-06 DIAGNOSIS — R2681 Unsteadiness on feet: Secondary | ICD-10-CM | POA: Diagnosis not present

## 2021-11-06 DIAGNOSIS — I639 Cerebral infarction, unspecified: Secondary | ICD-10-CM | POA: Diagnosis not present

## 2021-11-06 DIAGNOSIS — F5221 Male erectile disorder: Secondary | ICD-10-CM | POA: Diagnosis not present

## 2021-11-06 DIAGNOSIS — R859 Unspecified abnormal finding in specimens from digestive organs and abdominal cavity: Secondary | ICD-10-CM | POA: Diagnosis not present

## 2021-11-06 DIAGNOSIS — G4701 Insomnia due to medical condition: Secondary | ICD-10-CM | POA: Diagnosis not present

## 2021-11-06 DIAGNOSIS — F410 Panic disorder [episodic paroxysmal anxiety] without agoraphobia: Secondary | ICD-10-CM | POA: Diagnosis not present

## 2021-11-06 DIAGNOSIS — E785 Hyperlipidemia, unspecified: Secondary | ICD-10-CM | POA: Diagnosis not present

## 2021-11-11 DIAGNOSIS — D2262 Melanocytic nevi of left upper limb, including shoulder: Secondary | ICD-10-CM | POA: Diagnosis not present

## 2021-11-11 DIAGNOSIS — D2272 Melanocytic nevi of left lower limb, including hip: Secondary | ICD-10-CM | POA: Diagnosis not present

## 2021-11-11 DIAGNOSIS — D1801 Hemangioma of skin and subcutaneous tissue: Secondary | ICD-10-CM | POA: Diagnosis not present

## 2021-11-11 DIAGNOSIS — D2271 Melanocytic nevi of right lower limb, including hip: Secondary | ICD-10-CM | POA: Diagnosis not present

## 2021-11-11 DIAGNOSIS — L821 Other seborrheic keratosis: Secondary | ICD-10-CM | POA: Diagnosis not present

## 2021-11-11 DIAGNOSIS — D2261 Melanocytic nevi of right upper limb, including shoulder: Secondary | ICD-10-CM | POA: Diagnosis not present

## 2021-11-11 DIAGNOSIS — D225 Melanocytic nevi of trunk: Secondary | ICD-10-CM | POA: Diagnosis not present

## 2021-11-11 DIAGNOSIS — D1721 Benign lipomatous neoplasm of skin and subcutaneous tissue of right arm: Secondary | ICD-10-CM | POA: Diagnosis not present

## 2021-11-11 DIAGNOSIS — E785 Hyperlipidemia, unspecified: Secondary | ICD-10-CM | POA: Diagnosis not present

## 2022-01-04 DIAGNOSIS — Z23 Encounter for immunization: Secondary | ICD-10-CM | POA: Diagnosis not present

## 2022-01-31 ENCOUNTER — Other Ambulatory Visit: Payer: Self-pay | Admitting: Internal Medicine

## 2022-02-03 ENCOUNTER — Encounter: Payer: Self-pay | Admitting: Neurology

## 2022-02-03 ENCOUNTER — Ambulatory Visit (INDEPENDENT_AMBULATORY_CARE_PROVIDER_SITE_OTHER): Payer: Medicare Other | Admitting: Neurology

## 2022-02-03 VITALS — BP 131/79 | HR 66 | Ht 69.6 in | Wt 179.2 lb

## 2022-02-03 DIAGNOSIS — Z8673 Personal history of transient ischemic attack (TIA), and cerebral infarction without residual deficits: Secondary | ICD-10-CM | POA: Diagnosis not present

## 2022-02-03 DIAGNOSIS — R269 Unspecified abnormalities of gait and mobility: Secondary | ICD-10-CM | POA: Diagnosis not present

## 2022-02-03 NOTE — Patient Instructions (Signed)
I had a long discussion with the patient and his wife regarding his gait difficulties at likely multifactorial due to combination of his remote strokes as well as degenerative arthritis and mild spinal stenosis.  I advised him to get up slowly and avoid making sudden and quick movements.  Continue aspirin for stroke prevention and maintain aggressive risk factor modification with strict control of hypertension with blood pressure goal below 140/90, lipids with LDL cholesterol goal below 70 mg percent and diabetes with hemoglobin A1c goal below 6.5%.  He will return for follow-up in the future in 6 months to a year or call earlier if necessary.
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
## 2022-02-15 DIAGNOSIS — E785 Hyperlipidemia, unspecified: Secondary | ICD-10-CM | POA: Diagnosis not present

## 2022-02-15 DIAGNOSIS — R7989 Other specified abnormal findings of blood chemistry: Secondary | ICD-10-CM | POA: Diagnosis not present

## 2022-02-15 DIAGNOSIS — Z125 Encounter for screening for malignant neoplasm of prostate: Secondary | ICD-10-CM | POA: Diagnosis not present

## 2022-02-22 DIAGNOSIS — R2681 Unsteadiness on feet: Secondary | ICD-10-CM | POA: Diagnosis not present

## 2022-02-22 DIAGNOSIS — I639 Cerebral infarction, unspecified: Secondary | ICD-10-CM | POA: Diagnosis not present

## 2022-02-22 DIAGNOSIS — F410 Panic disorder [episodic paroxysmal anxiety] without agoraphobia: Secondary | ICD-10-CM | POA: Diagnosis not present

## 2022-02-22 DIAGNOSIS — R82998 Other abnormal findings in urine: Secondary | ICD-10-CM | POA: Diagnosis not present

## 2022-02-22 DIAGNOSIS — G4701 Insomnia due to medical condition: Secondary | ICD-10-CM | POA: Diagnosis not present

## 2022-02-22 DIAGNOSIS — Z0000 Encounter for general adult medical examination without abnormal findings: Secondary | ICD-10-CM | POA: Diagnosis not present

## 2022-02-22 DIAGNOSIS — R859 Unspecified abnormal finding in specimens from digestive organs and abdominal cavity: Secondary | ICD-10-CM | POA: Diagnosis not present

## 2022-02-22 DIAGNOSIS — Z23 Encounter for immunization: Secondary | ICD-10-CM | POA: Diagnosis not present

## 2022-02-22 DIAGNOSIS — F5221 Male erectile disorder: Secondary | ICD-10-CM | POA: Diagnosis not present

## 2022-02-22 DIAGNOSIS — R1310 Dysphagia, unspecified: Secondary | ICD-10-CM | POA: Diagnosis not present

## 2022-02-22 DIAGNOSIS — E785 Hyperlipidemia, unspecified: Secondary | ICD-10-CM | POA: Diagnosis not present

## 2022-03-23 IMAGING — RF DG ESOPHAGUS
10 of 12 series · 14 of 24 positions shown · non-contrast
Comparison: NONE.

CLINICAL DATA: Difficulty swallowing.

EXAM:
ESOPHAGUS/BARIUM SWALLOW/TABLET STUDY
TECHNIQUE: Combined double and single contrast examination was performed using
effervescent crystals, high-density barium, and thin liquid barium.
This exam was performed by Almuez, NP, and was supervised and
interpreted by Dr. Maradiaga.
FLUOROSCOPY TIME:  Radiation Exposure Index (as provided by the
fluoroscopic device):
If the device does not provide the exposure index:
Fluoroscopy Time:  2 minutes 30 seconds
Number of Acquired Images:  4

[Series 1: cp_standard · 1 of 70 frames shown (1 of 9)]
[frame 11/70]
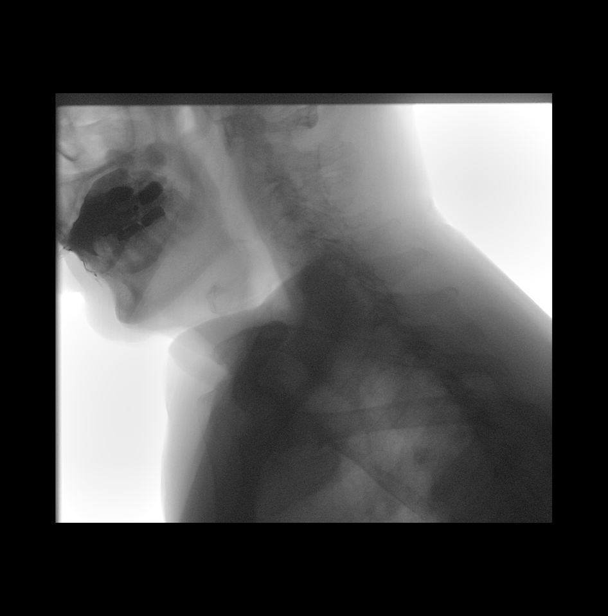

[Series 2: cp_standard · 2 of 85 frames shown (2 of 9)]
[frame 13/85]
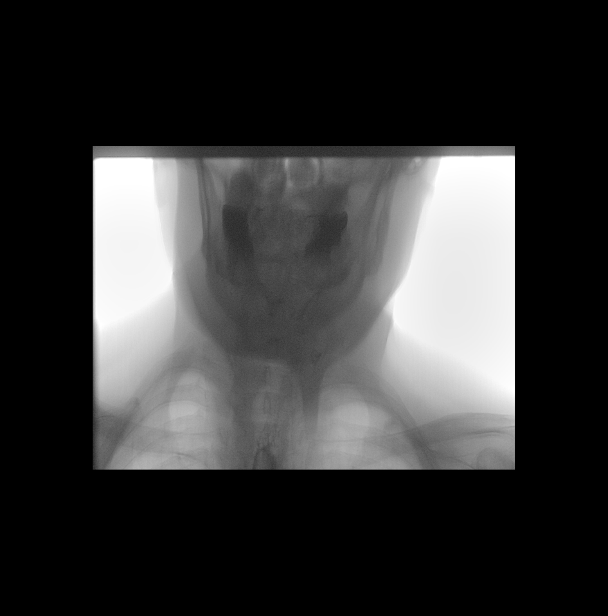
[frame 73/85]
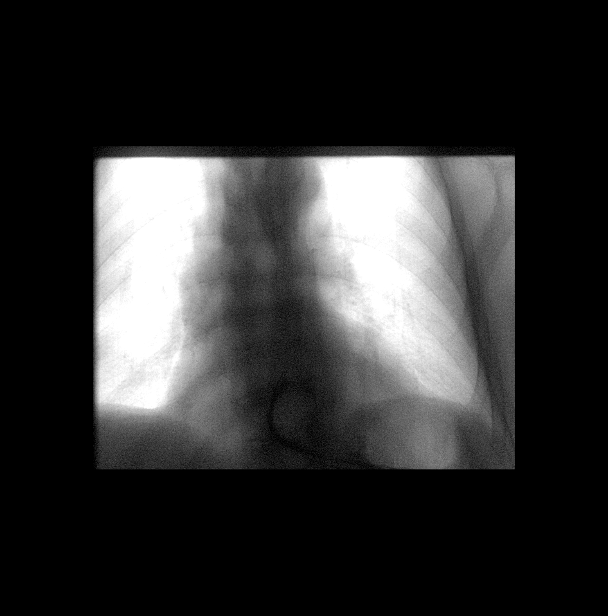

[Series 4: fluoro_barium 2fps_bw · 2 of 4 frames shown]
[frame 1/4]
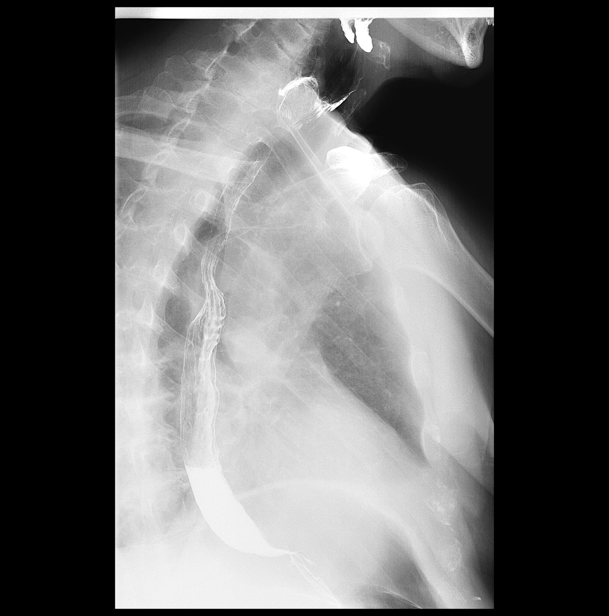
[frame 4/4]
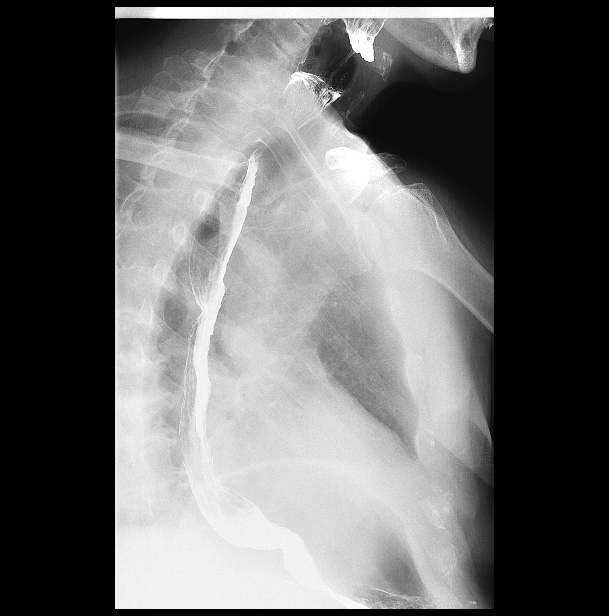

[Series 6: cp_standard · 1 of 65 frames shown (3 of 9)]
[frame 10/65]
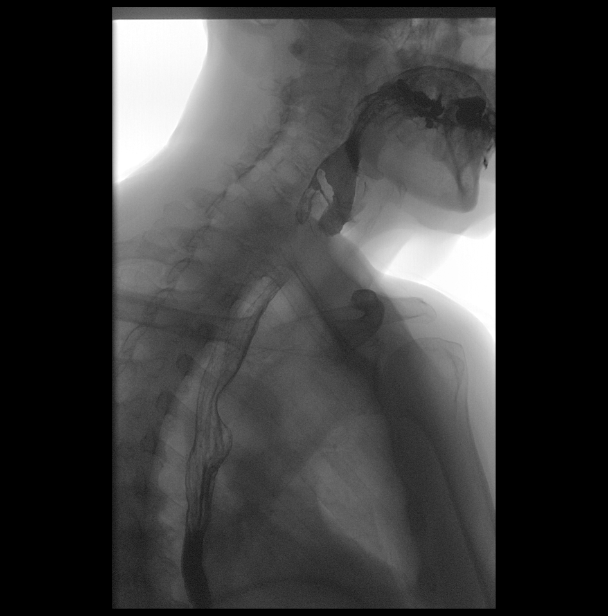

[Series 7: cp_standard · 2 of 97 frames shown (4 of 9)]
[frame 15/97]
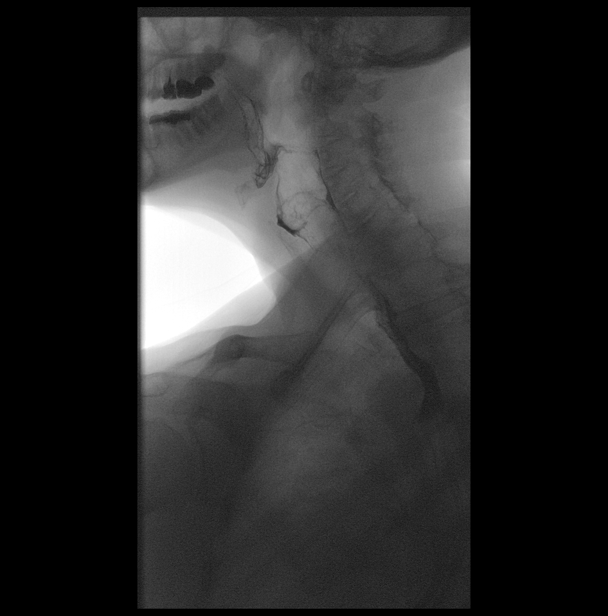
[frame 35/97]
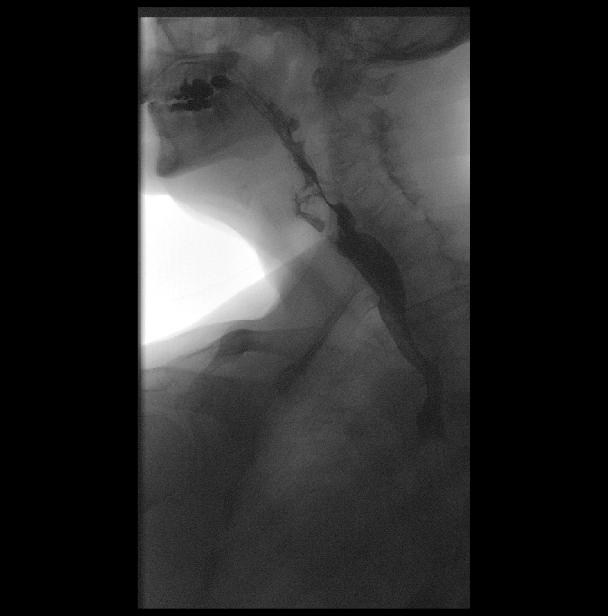

[Series 8: cp_standard · 1 of 118 frames shown (5 of 9)]
[frame 23/118]
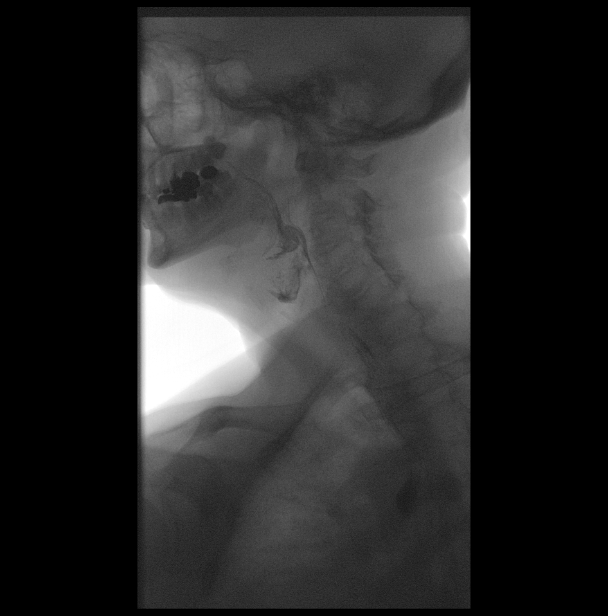

[Series 9: cp_standard · 1 of 114 frames shown (6 of 9)]
[frame 35/114]
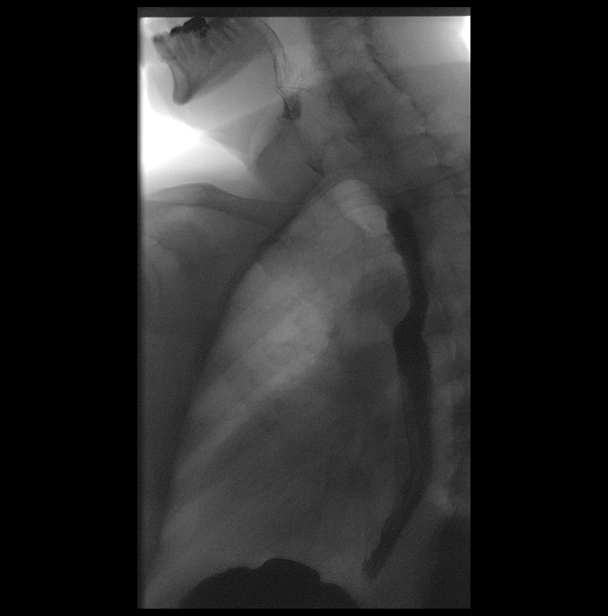

[Series 10: cp_standard · 2 of 53 frames shown (7 of 9)]
[frame 27/53]
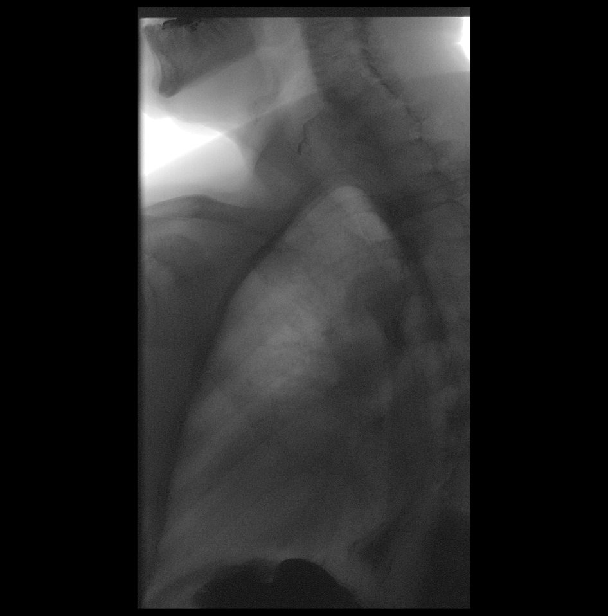
[frame 46/53]
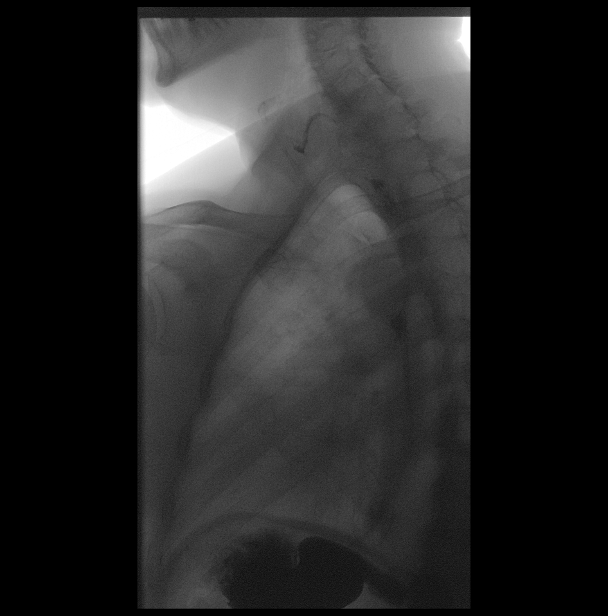

[Series 11: cp_standard · 1 of 79 frames shown (8 of 9)]
[frame 59/79]
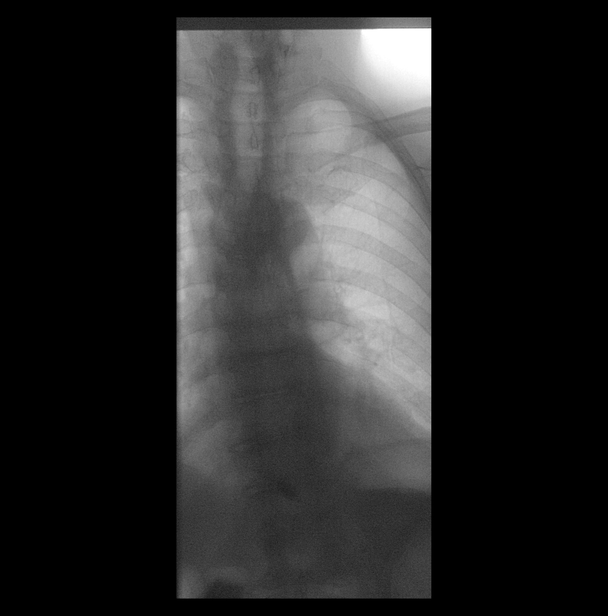

[Series 12: cp_standard · 1 of 7 frames shown (9 of 9)]
[frame 6/7]
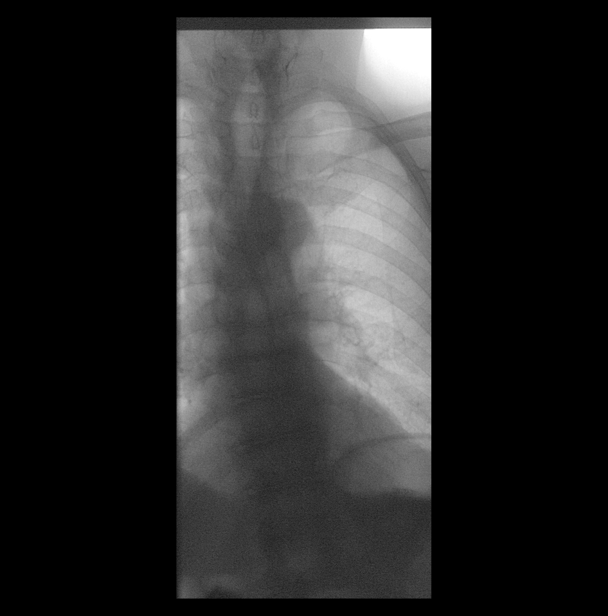

[14 of 24 positions shown; findings below may reference images not displayed]

FINDINGS: Swallowing: Appears normal. No vestibular penetration or aspiration
seen.

Pharynx: Unremarkable.

Esophagus: Normal appearance.

Esophageal motility: Mild esophageal dysmotility with poor
initiation stripping wave. No tertiary contractions.

Hiatal Hernia: None.

Gastroesophageal reflux: None visualized.

Ingested 13mm barium tablet: Passed normally

Other: None.
IMPRESSION: 1. Mild esophageal dysmotility favored presbyesophagus.
2. No mucosal abnormality, stricture or mass within the esophagus.

## 2022-06-16 DIAGNOSIS — M62830 Muscle spasm of back: Secondary | ICD-10-CM | POA: Diagnosis not present

## 2022-06-16 DIAGNOSIS — Z23 Encounter for immunization: Secondary | ICD-10-CM | POA: Diagnosis not present

## 2022-06-16 DIAGNOSIS — M545 Low back pain, unspecified: Secondary | ICD-10-CM | POA: Diagnosis not present

## 2022-06-30 DIAGNOSIS — M62830 Muscle spasm of back: Secondary | ICD-10-CM | POA: Diagnosis not present

## 2022-06-30 DIAGNOSIS — M545 Low back pain, unspecified: Secondary | ICD-10-CM | POA: Diagnosis not present

## 2022-07-02 DIAGNOSIS — M545 Low back pain, unspecified: Secondary | ICD-10-CM | POA: Diagnosis not present

## 2022-07-02 DIAGNOSIS — M62830 Muscle spasm of back: Secondary | ICD-10-CM | POA: Diagnosis not present

## 2022-07-03 DIAGNOSIS — Z23 Encounter for immunization: Secondary | ICD-10-CM | POA: Diagnosis not present

## 2022-07-05 DIAGNOSIS — M545 Low back pain, unspecified: Secondary | ICD-10-CM | POA: Diagnosis not present

## 2022-07-05 DIAGNOSIS — M62830 Muscle spasm of back: Secondary | ICD-10-CM | POA: Diagnosis not present

## 2022-07-07 DIAGNOSIS — M62830 Muscle spasm of back: Secondary | ICD-10-CM | POA: Diagnosis not present

## 2022-07-07 DIAGNOSIS — M545 Low back pain, unspecified: Secondary | ICD-10-CM | POA: Diagnosis not present

## 2022-07-27 DIAGNOSIS — H25813 Combined forms of age-related cataract, bilateral: Secondary | ICD-10-CM | POA: Diagnosis not present

## 2022-07-28 DIAGNOSIS — M62830 Muscle spasm of back: Secondary | ICD-10-CM | POA: Diagnosis not present

## 2022-07-28 DIAGNOSIS — M545 Low back pain, unspecified: Secondary | ICD-10-CM | POA: Diagnosis not present

## 2022-08-02 DIAGNOSIS — I639 Cerebral infarction, unspecified: Secondary | ICD-10-CM | POA: Diagnosis not present

## 2022-08-02 DIAGNOSIS — R2681 Unsteadiness on feet: Secondary | ICD-10-CM | POA: Diagnosis not present

## 2022-08-02 DIAGNOSIS — E785 Hyperlipidemia, unspecified: Secondary | ICD-10-CM | POA: Diagnosis not present

## 2022-08-02 DIAGNOSIS — R1310 Dysphagia, unspecified: Secondary | ICD-10-CM | POA: Diagnosis not present

## 2022-08-02 DIAGNOSIS — R859 Unspecified abnormal finding in specimens from digestive organs and abdominal cavity: Secondary | ICD-10-CM | POA: Diagnosis not present

## 2022-08-02 DIAGNOSIS — F5221 Male erectile disorder: Secondary | ICD-10-CM | POA: Diagnosis not present

## 2022-08-02 DIAGNOSIS — G4701 Insomnia due to medical condition: Secondary | ICD-10-CM | POA: Diagnosis not present

## 2022-08-02 DIAGNOSIS — F410 Panic disorder [episodic paroxysmal anxiety] without agoraphobia: Secondary | ICD-10-CM | POA: Diagnosis not present

## 2022-08-03 DIAGNOSIS — M62830 Muscle spasm of back: Secondary | ICD-10-CM | POA: Diagnosis not present

## 2022-08-03 DIAGNOSIS — M545 Low back pain, unspecified: Secondary | ICD-10-CM | POA: Diagnosis not present

## 2022-08-04 DIAGNOSIS — E785 Hyperlipidemia, unspecified: Secondary | ICD-10-CM | POA: Diagnosis not present

## 2022-08-04 DIAGNOSIS — Z79899 Other long term (current) drug therapy: Secondary | ICD-10-CM | POA: Diagnosis not present

## 2022-08-05 ENCOUNTER — Encounter: Payer: Self-pay | Admitting: Neurology

## 2022-08-05 ENCOUNTER — Ambulatory Visit (INDEPENDENT_AMBULATORY_CARE_PROVIDER_SITE_OTHER): Payer: Medicare Other | Admitting: Neurology

## 2022-08-05 VITALS — BP 115/71 | HR 73 | Ht 69.6 in | Wt 180.0 lb

## 2022-08-05 DIAGNOSIS — G214 Vascular parkinsonism: Secondary | ICD-10-CM

## 2022-08-05 DIAGNOSIS — Z8673 Personal history of transient ischemic attack (TIA), and cerebral infarction without residual deficits: Secondary | ICD-10-CM | POA: Diagnosis not present

## 2022-08-05 DIAGNOSIS — G3184 Mild cognitive impairment, so stated: Secondary | ICD-10-CM

## 2022-08-05 MED ORDER — CEREFOLIN 6-1-50-5 MG PO TABS
1.0000 | ORAL_TABLET | ORAL | 3 refills | Status: AC
Start: 2022-08-05 — End: 2022-08-06

## 2022-08-05 NOTE — Patient Instructions (Signed)
I had a long discussion with the patient regarding his gait difficulties at likely multifactorial due to combination of his remote strokes , mild poststroke embolism as well as degenerative arthritis and mild spinal stenosis.  I feel his parkinsonism symptoms quite mild at the present time and do not justify a trial of Sinemet.  I advised him to get up slowly and avoid making sudden and quick movements.  Continue aspirin for stroke prevention and maintain aggressive risk factor modification with strict control of hypertension with blood pressure goal below 140/90, lipids with LDL cholesterol goal below 70 mg percent and diabetes with hemoglobin A1c goal below 6.5%.  He will return for follow-up in the future in 6 months to a year or call earlier if necessary. Continue participation in cognitively challenging activities like solving crossword puzzles, playing bridge and sodoku.  Trial of Cerefolin NAC 1 tablet daily to help with memory.  Return for follow-up in 6 months or call earlier if necessary . Memory Compensation Strategies  Use "WARM" strategy.  W= write it down  A= associate it  R= repeat it  M= make a mental note  2.   You can keep a Social worker.  Use a 3-ring notebook with sections for the following: calendar, important names and phone numbers,  medications, doctors' names/phone numbers, lists/reminders, and a section to journal what you did  each day.   3.    Use a calendar to write appointments down.  4.    Write yourself a schedule for the day.  This can be placed on the calendar or in a separate section of the Memory Notebook.  Keeping a  regular schedule can help memory.  5.    Use medication organizer with sections for each day or morning/evening pills.  You may need help loading it  6.    Keep a basket, or pegboard by the door.  Place items that you need to take out with you in the basket or on the pegboard.  You may also want to  include a message board for  reminders.  7.    Use sticky notes.  Place sticky notes with reminders in a place where the task is performed.  For example: " turn off the  stove" placed by the stove, "lock the door" placed on the door at eye level, " take your medications" on  the bathroom mirror or by the place where you normally take your medications.  8.    Use alarms/timers.  Use while cooking to remind yourself to check on food or as a reminder to take your medicine, or as a  reminder to make a call, or as a reminder to perform another task, etc.
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
## 2022-08-06 DIAGNOSIS — M545 Low back pain, unspecified: Secondary | ICD-10-CM | POA: Diagnosis not present

## 2022-08-06 DIAGNOSIS — M62830 Muscle spasm of back: Secondary | ICD-10-CM | POA: Diagnosis not present

## 2022-08-24 DIAGNOSIS — M62830 Muscle spasm of back: Secondary | ICD-10-CM | POA: Diagnosis not present

## 2022-08-24 DIAGNOSIS — M545 Low back pain, unspecified: Secondary | ICD-10-CM | POA: Diagnosis not present

## 2022-08-26 DIAGNOSIS — M62830 Muscle spasm of back: Secondary | ICD-10-CM | POA: Diagnosis not present

## 2022-08-26 DIAGNOSIS — M545 Low back pain, unspecified: Secondary | ICD-10-CM | POA: Diagnosis not present

## 2022-11-29 ENCOUNTER — Encounter: Payer: Self-pay | Admitting: Neurology

## 2022-12-14 ENCOUNTER — Encounter: Payer: Self-pay | Admitting: Neurology

## 2023-02-15 DIAGNOSIS — M4316 Spondylolisthesis, lumbar region: Secondary | ICD-10-CM | POA: Diagnosis not present

## 2023-02-15 DIAGNOSIS — M6281 Muscle weakness (generalized): Secondary | ICD-10-CM | POA: Diagnosis not present

## 2023-02-17 DIAGNOSIS — M6281 Muscle weakness (generalized): Secondary | ICD-10-CM | POA: Diagnosis not present

## 2023-02-17 DIAGNOSIS — M4316 Spondylolisthesis, lumbar region: Secondary | ICD-10-CM | POA: Diagnosis not present

## 2023-02-21 DIAGNOSIS — M6281 Muscle weakness (generalized): Secondary | ICD-10-CM | POA: Diagnosis not present

## 2023-02-21 DIAGNOSIS — M4316 Spondylolisthesis, lumbar region: Secondary | ICD-10-CM | POA: Diagnosis not present

## 2023-02-24 DIAGNOSIS — M6281 Muscle weakness (generalized): Secondary | ICD-10-CM | POA: Diagnosis not present

## 2023-02-24 DIAGNOSIS — M4316 Spondylolisthesis, lumbar region: Secondary | ICD-10-CM | POA: Diagnosis not present

## 2023-02-28 DIAGNOSIS — M6281 Muscle weakness (generalized): Secondary | ICD-10-CM | POA: Diagnosis not present

## 2023-02-28 DIAGNOSIS — M4316 Spondylolisthesis, lumbar region: Secondary | ICD-10-CM | POA: Diagnosis not present

## 2023-03-01 ENCOUNTER — Ambulatory Visit (INDEPENDENT_AMBULATORY_CARE_PROVIDER_SITE_OTHER): Payer: Medicare Other | Admitting: Neurology

## 2023-03-01 ENCOUNTER — Encounter: Payer: Self-pay | Admitting: Neurology

## 2023-03-01 VITALS — BP 121/79 | HR 63 | Ht 69.0 in | Wt 181.0 lb

## 2023-03-01 DIAGNOSIS — G3184 Mild cognitive impairment, so stated: Secondary | ICD-10-CM | POA: Diagnosis not present

## 2023-03-01 DIAGNOSIS — G214 Vascular parkinsonism: Secondary | ICD-10-CM

## 2023-03-01 DIAGNOSIS — R269 Unspecified abnormalities of gait and mobility: Secondary | ICD-10-CM

## 2023-03-01 DIAGNOSIS — Z8673 Personal history of transient ischemic attack (TIA), and cerebral infarction without residual deficits: Secondary | ICD-10-CM | POA: Diagnosis not present
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
## 2023-03-01 NOTE — Patient Instructions (Addendum)
I had a long discussion with the patient and his wife regarding his gait difficulties which is likely multifactorial due to combination of his remote strokes , mild poststroke parkinsonism as well as degenerative arthritis and mild spinal stenosis.  I feel his parkinsonism symptoms quite mild at the present time and do not justify a trial of Sinemet.  I advised him to get up slowly and avoid making sudden and quick movements.  Continue aspirin for stroke prevention and maintain aggressive risk factor modification with strict control of hypertension with blood pressure goal below 140/90, lipids with LDL cholesterol goal below 70 mg percent and diabetes with hemoglobin A1c goal below 6.5%.  Check screening carotid ultrasound study.  He will return for follow-up in the future in 6 months to a year or call earlier if necessary. Continue participation in cognitively challenging activities like solving crossword puzzles, playing bridge and sodoku.

## 2023-03-03 DIAGNOSIS — M6281 Muscle weakness (generalized): Secondary | ICD-10-CM | POA: Diagnosis not present

## 2023-03-03 DIAGNOSIS — M4316 Spondylolisthesis, lumbar region: Secondary | ICD-10-CM | POA: Diagnosis not present

## 2023-03-09 DIAGNOSIS — M6281 Muscle weakness (generalized): Secondary | ICD-10-CM | POA: Diagnosis not present

## 2023-03-09 DIAGNOSIS — M4316 Spondylolisthesis, lumbar region: Secondary | ICD-10-CM | POA: Diagnosis not present

## 2023-03-15 DIAGNOSIS — Z125 Encounter for screening for malignant neoplasm of prostate: Secondary | ICD-10-CM | POA: Diagnosis not present

## 2023-03-15 DIAGNOSIS — R7989 Other specified abnormal findings of blood chemistry: Secondary | ICD-10-CM | POA: Diagnosis not present

## 2023-03-15 DIAGNOSIS — M6281 Muscle weakness (generalized): Secondary | ICD-10-CM | POA: Diagnosis not present

## 2023-03-15 DIAGNOSIS — M4316 Spondylolisthesis, lumbar region: Secondary | ICD-10-CM | POA: Diagnosis not present

## 2023-03-15 DIAGNOSIS — Z7982 Long term (current) use of aspirin: Secondary | ICD-10-CM | POA: Diagnosis not present

## 2023-03-15 DIAGNOSIS — E785 Hyperlipidemia, unspecified: Secondary | ICD-10-CM | POA: Diagnosis not present

## 2023-03-17 DIAGNOSIS — M6281 Muscle weakness (generalized): Secondary | ICD-10-CM | POA: Diagnosis not present

## 2023-03-17 DIAGNOSIS — M4316 Spondylolisthesis, lumbar region: Secondary | ICD-10-CM | POA: Diagnosis not present

## 2023-03-22 DIAGNOSIS — R82998 Other abnormal findings in urine: Secondary | ICD-10-CM | POA: Diagnosis not present

## 2023-03-22 DIAGNOSIS — G4701 Insomnia due to medical condition: Secondary | ICD-10-CM | POA: Diagnosis not present

## 2023-03-22 DIAGNOSIS — Z1339 Encounter for screening examination for other mental health and behavioral disorders: Secondary | ICD-10-CM | POA: Diagnosis not present

## 2023-03-22 DIAGNOSIS — F410 Panic disorder [episodic paroxysmal anxiety] without agoraphobia: Secondary | ICD-10-CM | POA: Diagnosis not present

## 2023-03-22 DIAGNOSIS — Z0000 Encounter for general adult medical examination without abnormal findings: Secondary | ICD-10-CM | POA: Diagnosis not present

## 2023-03-22 DIAGNOSIS — F5221 Male erectile disorder: Secondary | ICD-10-CM | POA: Diagnosis not present

## 2023-03-22 DIAGNOSIS — E559 Vitamin D deficiency, unspecified: Secondary | ICD-10-CM | POA: Diagnosis not present

## 2023-03-22 DIAGNOSIS — E785 Hyperlipidemia, unspecified: Secondary | ICD-10-CM | POA: Diagnosis not present

## 2023-03-22 DIAGNOSIS — I639 Cerebral infarction, unspecified: Secondary | ICD-10-CM | POA: Diagnosis not present

## 2023-03-22 DIAGNOSIS — M4800 Spinal stenosis, site unspecified: Secondary | ICD-10-CM | POA: Diagnosis not present

## 2023-03-22 DIAGNOSIS — R2681 Unsteadiness on feet: Secondary | ICD-10-CM | POA: Diagnosis not present

## 2023-03-22 DIAGNOSIS — Z1331 Encounter for screening for depression: Secondary | ICD-10-CM | POA: Diagnosis not present

## 2023-03-24 DIAGNOSIS — M6281 Muscle weakness (generalized): Secondary | ICD-10-CM | POA: Diagnosis not present

## 2023-03-24 DIAGNOSIS — M4316 Spondylolisthesis, lumbar region: Secondary | ICD-10-CM | POA: Diagnosis not present

## 2023-03-29 DIAGNOSIS — M4316 Spondylolisthesis, lumbar region: Secondary | ICD-10-CM | POA: Diagnosis not present

## 2023-03-29 DIAGNOSIS — M6281 Muscle weakness (generalized): Secondary | ICD-10-CM | POA: Diagnosis not present

## 2023-03-30 ENCOUNTER — Ambulatory Visit (HOSPITAL_COMMUNITY)
Admission: RE | Admit: 2023-03-30 | Discharge: 2023-03-30 | Disposition: A | Discharge status: Home / Self Care | Payer: Medicare Other | Source: Ambulatory Visit | Attending: Neurology | Admitting: Neurology

## 2023-03-30 DIAGNOSIS — Z8673 Personal history of transient ischemic attack (TIA), and cerebral infarction without residual deficits: Secondary | ICD-10-CM | POA: Diagnosis not present

## 2023-03-30 NOTE — Progress Notes (Signed)
Bilateral carotid ultrasound study completed.   Please see CV Procedures for preliminary results.  Jemima Petko, RVT  1:13 PM 03/30/23

## 2023-03-31 DIAGNOSIS — M6281 Muscle weakness (generalized): Secondary | ICD-10-CM | POA: Diagnosis not present

## 2023-03-31 DIAGNOSIS — M4316 Spondylolisthesis, lumbar region: Secondary | ICD-10-CM | POA: Diagnosis not present

## 2023-04-02 ENCOUNTER — Encounter: Payer: Self-pay | Admitting: Neurology

## 2023-04-03 NOTE — Progress Notes (Signed)
Kindly inform the patient that carotid ultrasound study shows no significant narrowing of either carotid artery in the neck

## 2023-04-07 DIAGNOSIS — M6281 Muscle weakness (generalized): Secondary | ICD-10-CM | POA: Diagnosis not present

## 2023-04-07 DIAGNOSIS — M4316 Spondylolisthesis, lumbar region: Secondary | ICD-10-CM | POA: Diagnosis not present

## 2023-04-11 DIAGNOSIS — M545 Low back pain, unspecified: Secondary | ICD-10-CM | POA: Diagnosis not present

## 2023-04-12 DIAGNOSIS — M4316 Spondylolisthesis, lumbar region: Secondary | ICD-10-CM | POA: Diagnosis not present

## 2023-04-12 DIAGNOSIS — M6281 Muscle weakness (generalized): Secondary | ICD-10-CM | POA: Diagnosis not present

## 2023-04-15 DIAGNOSIS — M6281 Muscle weakness (generalized): Secondary | ICD-10-CM | POA: Diagnosis not present

## 2023-04-15 DIAGNOSIS — M4316 Spondylolisthesis, lumbar region: Secondary | ICD-10-CM | POA: Diagnosis not present

## 2023-04-18 ENCOUNTER — Telehealth: Payer: Self-pay | Admitting: Neurology

## 2023-04-18 NOTE — Telephone Encounter (Signed)
Received sleep referral from [facility] Dr. Chilton Greathouse for potential HST, eval of significant snoring, gasping for breath at night complicated by hx of strokes and minimal if cognitive impairment. Placed in sleep referrals box

## 2023-04-19 DIAGNOSIS — M4316 Spondylolisthesis, lumbar region: Secondary | ICD-10-CM | POA: Diagnosis not present

## 2023-04-19 DIAGNOSIS — M6281 Muscle weakness (generalized): Secondary | ICD-10-CM | POA: Diagnosis not present

## 2023-04-21 DIAGNOSIS — M6281 Muscle weakness (generalized): Secondary | ICD-10-CM | POA: Diagnosis not present

## 2023-04-21 DIAGNOSIS — M4316 Spondylolisthesis, lumbar region: Secondary | ICD-10-CM | POA: Diagnosis not present

## 2023-04-26 DIAGNOSIS — M6281 Muscle weakness (generalized): Secondary | ICD-10-CM | POA: Diagnosis not present

## 2023-04-26 DIAGNOSIS — M4316 Spondylolisthesis, lumbar region: Secondary | ICD-10-CM | POA: Diagnosis not present

## 2023-04-28 DIAGNOSIS — M6281 Muscle weakness (generalized): Secondary | ICD-10-CM | POA: Diagnosis not present

## 2023-04-28 DIAGNOSIS — M4316 Spondylolisthesis, lumbar region: Secondary | ICD-10-CM | POA: Diagnosis not present

## 2023-05-10 DIAGNOSIS — M4316 Spondylolisthesis, lumbar region: Secondary | ICD-10-CM | POA: Diagnosis not present

## 2023-05-10 DIAGNOSIS — M6281 Muscle weakness (generalized): Secondary | ICD-10-CM | POA: Diagnosis not present

## 2023-05-17 DIAGNOSIS — M6281 Muscle weakness (generalized): Secondary | ICD-10-CM | POA: Diagnosis not present

## 2023-05-17 DIAGNOSIS — M4316 Spondylolisthesis, lumbar region: Secondary | ICD-10-CM | POA: Diagnosis not present

## 2023-05-31 DIAGNOSIS — M6281 Muscle weakness (generalized): Secondary | ICD-10-CM | POA: Diagnosis not present

## 2023-05-31 DIAGNOSIS — M4316 Spondylolisthesis, lumbar region: Secondary | ICD-10-CM | POA: Diagnosis not present

## 2023-06-02 DIAGNOSIS — M4316 Spondylolisthesis, lumbar region: Secondary | ICD-10-CM | POA: Diagnosis not present

## 2023-06-02 DIAGNOSIS — M6281 Muscle weakness (generalized): Secondary | ICD-10-CM | POA: Diagnosis not present

## 2023-06-07 DIAGNOSIS — M4316 Spondylolisthesis, lumbar region: Secondary | ICD-10-CM | POA: Diagnosis not present

## 2023-06-07 DIAGNOSIS — M6281 Muscle weakness (generalized): Secondary | ICD-10-CM | POA: Diagnosis not present

## 2023-06-09 DIAGNOSIS — M4316 Spondylolisthesis, lumbar region: Secondary | ICD-10-CM | POA: Diagnosis not present

## 2023-06-09 DIAGNOSIS — M6281 Muscle weakness (generalized): Secondary | ICD-10-CM | POA: Diagnosis not present

## 2023-06-14 DIAGNOSIS — M4316 Spondylolisthesis, lumbar region: Secondary | ICD-10-CM | POA: Diagnosis not present

## 2023-06-14 DIAGNOSIS — M6281 Muscle weakness (generalized): Secondary | ICD-10-CM | POA: Diagnosis not present

## 2023-06-21 DIAGNOSIS — M4316 Spondylolisthesis, lumbar region: Secondary | ICD-10-CM | POA: Diagnosis not present

## 2023-06-21 DIAGNOSIS — M6281 Muscle weakness (generalized): Secondary | ICD-10-CM | POA: Diagnosis not present

## 2023-06-23 DIAGNOSIS — M4316 Spondylolisthesis, lumbar region: Secondary | ICD-10-CM | POA: Diagnosis not present

## 2023-06-23 DIAGNOSIS — M6281 Muscle weakness (generalized): Secondary | ICD-10-CM | POA: Diagnosis not present

## 2023-07-05 DIAGNOSIS — M4316 Spondylolisthesis, lumbar region: Secondary | ICD-10-CM | POA: Diagnosis not present

## 2023-07-05 DIAGNOSIS — M6281 Muscle weakness (generalized): Secondary | ICD-10-CM | POA: Diagnosis not present

## 2023-07-07 DIAGNOSIS — M6281 Muscle weakness (generalized): Secondary | ICD-10-CM | POA: Diagnosis not present

## 2023-07-07 DIAGNOSIS — M4316 Spondylolisthesis, lumbar region: Secondary | ICD-10-CM | POA: Diagnosis not present

## 2023-07-19 DIAGNOSIS — M6281 Muscle weakness (generalized): Secondary | ICD-10-CM | POA: Diagnosis not present

## 2023-07-19 DIAGNOSIS — M4316 Spondylolisthesis, lumbar region: Secondary | ICD-10-CM | POA: Diagnosis not present

## 2023-08-08 ENCOUNTER — Telehealth: Payer: Self-pay | Admitting: Neurology

## 2023-08-08 NOTE — Telephone Encounter (Signed)
Pt rescheduled appt due to have schedule conflict.

## 2023-08-18 DIAGNOSIS — Z23 Encounter for immunization: Secondary | ICD-10-CM | POA: Diagnosis not present

## 2023-08-24 DIAGNOSIS — H5340 Unspecified visual field defects: Secondary | ICD-10-CM | POA: Diagnosis not present

## 2023-08-24 DIAGNOSIS — H2513 Age-related nuclear cataract, bilateral: Secondary | ICD-10-CM | POA: Diagnosis not present

## 2023-09-02 DIAGNOSIS — Z23 Encounter for immunization: Secondary | ICD-10-CM | POA: Diagnosis not present

## 2023-09-21 DIAGNOSIS — F5221 Male erectile disorder: Secondary | ICD-10-CM | POA: Diagnosis not present

## 2023-09-21 DIAGNOSIS — E559 Vitamin D deficiency, unspecified: Secondary | ICD-10-CM | POA: Diagnosis not present

## 2023-09-21 DIAGNOSIS — R2681 Unsteadiness on feet: Secondary | ICD-10-CM | POA: Diagnosis not present

## 2023-09-21 DIAGNOSIS — Z8673 Personal history of transient ischemic attack (TIA), and cerebral infarction without residual deficits: Secondary | ICD-10-CM | POA: Diagnosis not present

## 2023-09-21 DIAGNOSIS — G4701 Insomnia due to medical condition: Secondary | ICD-10-CM | POA: Diagnosis not present

## 2023-09-21 DIAGNOSIS — E785 Hyperlipidemia, unspecified: Secondary | ICD-10-CM | POA: Diagnosis not present

## 2023-09-21 DIAGNOSIS — M4800 Spinal stenosis, site unspecified: Secondary | ICD-10-CM | POA: Diagnosis not present

## 2023-09-28 ENCOUNTER — Ambulatory Visit: Payer: Medicare Other | Admitting: Neurology

## 2024-01-03 DIAGNOSIS — G20A1 Parkinson's disease without dyskinesia, without mention of fluctuations: Secondary | ICD-10-CM | POA: Diagnosis not present

## 2024-01-03 DIAGNOSIS — R2681 Unsteadiness on feet: Secondary | ICD-10-CM | POA: Diagnosis not present

## 2024-02-14 ENCOUNTER — Encounter: Payer: Self-pay | Admitting: Neurology

## 2024-02-14 ENCOUNTER — Ambulatory Visit (INDEPENDENT_AMBULATORY_CARE_PROVIDER_SITE_OTHER): Payer: Medicare Other | Admitting: Neurology

## 2024-02-14 VITALS — BP 118/70 | HR 66 | Ht 69.0 in | Wt 179.4 lb

## 2024-02-14 DIAGNOSIS — G214 Vascular parkinsonism: Secondary | ICD-10-CM | POA: Diagnosis not present

## 2024-02-14 DIAGNOSIS — G3184 Mild cognitive impairment, so stated: Secondary | ICD-10-CM

## 2024-02-14 DIAGNOSIS — Z8673 Personal history of transient ischemic attack (TIA), and cerebral infarction without residual deficits: Secondary | ICD-10-CM | POA: Diagnosis not present

## 2024-02-14 DIAGNOSIS — R269 Unspecified abnormalities of gait and mobility: Secondary | ICD-10-CM | POA: Diagnosis not present

## 2024-02-14 MED ORDER — CARBIDOPA-LEVODOPA ER 50-200 MG PO TBCR: 1.0000 | EXTENDED_RELEASE_TABLET | Freq: Two times a day (BID) | ORAL | 3 refills | Status: DC

## 2024-02-14 NOTE — Patient Instructions (Addendum)
 I had a long discussion with the patient and his wife regarding his gait difficulties at likely multifactorial due to combination of his remote strokes , mild poststroke parkinsonism as well as degenerative arthritis and mild spinal stenosis.  I feel his parkinsonism symptoms are not controlled on the current dose of Sinemet hence recommend changing to Sinemet CR 50/200 mg 1 tablet twice daily and increase further as tolerated.Aaron Aas  He will  keep his scheduled follow-up appointment with [facility] neurology for his parkinsonism follow-up I advised him to get up slowly and avoid making sudden and quick movements.  Continue aspirin for stroke prevention and maintain aggressive risk factor modification with strict control of hypertension with blood pressure goal below 140/90, lipids with LDL cholesterol goal below 70 mg percent and diabetes with hemoglobin A1c goal below 6.5%.  He will return for follow-up in the future in 6 months to a year or call earlier if necessary. Continue participation in cognitively challenging activities like solving crossword puzzles, playing bridge and sodoku.  Refer to physical therapy for gait and balance training given frequent falls.Return for f/u in 1 year or call earlier if needed.  Fall Prevention in the Home, Adult Falls can cause injuries and affect people of all ages. There are many simple things that you can do to make your home safe and to help prevent falls. If you need it, ask for help making these changes. What actions can I take to prevent falls? General information Use good lighting in all rooms. Make sure to: Replace any light bulbs that burn out. Turn on lights if it is dark and use night-lights. Keep items that you use often in easy-to-reach places. Lower the shelves around your home if needed. Move furniture so that there are clear paths around it. Do not keep throw rugs or other things on the floor that can make you trip. If any of your floors are uneven, fix  them. Add color or contrast paint or tape to clearly mark and help you see: Grab bars or handrails. First and last steps of staircases. Where the edge of each step is. If you use a ladder or stepladder: Make sure that it is fully opened. Do not climb a closed ladder. Make sure the sides of the ladder are locked in place. Have someone hold the ladder while you use it. Know where your pets are as you move through your home. What can I do in the bathroom?     Keep the floor dry. Clean up any water that is on the floor right away. Remove soap buildup in the bathtub or shower. Buildup makes bathtubs and showers slippery. Use non-skid mats or decals on the floor of the bathtub or shower. Attach bath mats securely with double-sided, non-slip rug tape. If you need to sit down while you are in the shower, use a non-slip stool. Install grab bars by the toilet and in the bathtub and shower. Do not use towel bars as grab bars. What can I do in the bedroom? Make sure that you have a light by your bed that is easy to reach. Do not use any sheets or blankets on your bed that hang to the floor. Have a firm bench or chair with side arms that you can use for support when you get dressed. What can I do in the kitchen? Clean up any spills right away. If you need to reach something above you, use a sturdy step stool that has a grab bar. Keep  electrical cables out of the way. Do not use floor polish or wax that makes floors slippery. What can I do with my stairs? Do not leave anything on the stairs. Make sure that you have a light switch at the top and the bottom of the stairs. Have them installed if you do not have them. Make sure that there are handrails on both sides of the stairs. Fix handrails that are broken or loose. Make sure that handrails are as long as the staircases. Install non-slip stair treads on all stairs in your home if they do not have carpet. Avoid having throw rugs at the top or  bottom of stairs, or secure the rugs with carpet tape to prevent them from moving. Choose a carpet design that does not hide the edge of steps on the stairs. Make sure that carpet is firmly attached to the stairs. Fix any carpet that is loose or worn. What can I do on the outside of my home? Use bright outdoor lighting. Repair the edges of walkways and driveways and fix any cracks. Clear paths of anything that can make you trip, such as tools or rocks. Add color or contrast paint or tape to clearly mark and help you see high doorway thresholds. Trim any bushes or trees on the main path into your home. Check that handrails are securely fastened and in good repair. Both sides of all steps should have handrails. Install guardrails along the edges of any raised decks or porches. Have leaves, snow, and ice cleared regularly. Use sand, salt, or ice melt on walkways during winter months if you live where there is ice and snow. In the garage, clean up any spills right away, including grease or oil spills. What other actions can I take? Review your medicines with your health care provider. Some medicines can make you confused or feel dizzy. This can increase your chance of falling. Wear closed-toe shoes that fit well and support your feet. Wear shoes that have rubber soles and low heels. Use a cane, walker, scooter, or crutches that help you move around if needed. Talk with your provider about other ways that you can decrease your risk of falls. This may include seeing a physical therapist to learn to do exercises to improve movement and strength. Where to find more information Centers for Disease Control and Prevention, [employer]: TonerPromos.no [employer] on Aging: BaseRingTones.pl National Institute on Aging: BaseRingTones.pl Contact a health care provider if: You are afraid of falling at home. You feel weak, drowsy, or dizzy at home. You fall at home. Get help right away if you: Lose consciousness or have  trouble moving after a fall. Have a fall that causes a head injury. These symptoms may be an emergency. Get help right away. Call 911. Do not wait to see if the symptoms will go away. Do not drive yourself to the hospital. This information is not intended to replace advice given to you by your health care provider. Make sure you discuss any questions you have with your health care provider. Document Revised: 04/26/2022 Document Reviewed: 04/26/2022 Elsevier Patient Education  2024 [employer].
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
## 2024-02-27 DIAGNOSIS — G20A1 Parkinson's disease without dyskinesia, without mention of fluctuations: Secondary | ICD-10-CM | POA: Diagnosis not present

## 2024-03-05 NOTE — Therapy (Signed)
 OUTPATIENT PHYSICAL THERAPY NEURO EVALUATION   Patient Name: Timothy Shields MRN: [id number] DOB:[birth year], 78 y.o., male Today's Date: 03/06/2024   PCP: Janey Santos, MD  REFERRING PROVIDER: Rosemarie Eather RAMAN, MD   END OF SESSION:  PT End of Session - 03/06/24 1531     Visit Number 1    Number of Visits 17    Date for PT Re-Evaluation 05/05/24    Authorization Type MEDICARE PART A AND B    PT Start Time 1530    PT Stop Time 1615    PT Time Calculation (min) 45 min    Equipment Utilized During Treatment Gait belt    Activity Tolerance Patient tolerated treatment well    Behavior During Therapy WFL for tasks assessed/performed          Past Medical History:  Diagnosis Date   Cerebrovascular disease    Headache(784.0)    Stroke (HCC)    3   Past Surgical History:  Procedure Laterality Date   HERNIA REPAIR     HERNIA REPAIR     Twice in the 1970's   Patient Active Problem List   Diagnosis Date Noted   Erectile dysfunction 09/13/2013   Other generalized ischemic cerebrovascular disease 07/10/2013   Other and unspecified hyperlipidemia 07/10/2013    ONSET DATE: 02/14/2024  REFERRING DIAG: R26.9 (ICD-10-CM) - Gait disorder   THERAPY DIAG:  Unsteadiness on feet  Other abnormalities of gait and mobility  History of falling  Other symptoms and signs involving the nervous system  Abnormal posture  Rationale for Evaluation and Treatment: Rehabilitation  SUBJECTIVE:                                                                                                                                                                                             SUBJECTIVE STATEMENT: Saw a PD specialist at [facility] and reports 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 Prescribed him carbidopa  levodopa  3 and 1/2 tabs for 3 times a day. Symptoms started 2-3 years ago. Started to get the tremors  last year. Reports that after starting Carbidopa  he has been able to turn easier in the bed and has been sleeping easier. Some days will have tremors in the L hand, other times he doesn't. Sometimes one of his legs will start shaking too. Notices he is bent way over to the L quite substantially.  Brings in a cane, uses it sometimes when he can also grab onto the wall for balance. Has a walker, but it is currently  in the car. Reports some days he uses no AD. Reports has a titanium rollator, I can lift it up with one finger.  Currently still working, Economist of a Physiological scientist. Had a driving evaluation from [facility] and was told not to drive. Has had 9 falls in the past year, comes up on his toes and can get pushed over with a finger. Has freezing episodes when he feet get stuck, a lot of times it happens when he goes through a door. Goes to a [employer] 3x a week.   Pt accompanied by: Daughter  PERTINENT HISTORY:    Per Dr. Rosemarie: remote right PICA and left PCA infarcts in 1982 as well as left basal ganglia lacunar infarct in 2005 with mild residual right sided weakness with recent increase complaints of worsening gait difficulties likely multifactorial due to combination of old strokes, poststroke parkinsonism and mild spinal stenosis and spondylosis. He also has mild cognitive impairment which appears quite stable    Patient has noticed increasing tremors in his left hand as well as some increase hypophonia and drooling.  He does have some difficulty at times getting up from the chair or wearing his clothes.  He was seen at [facility] neurology for consultation and started on Sinemet  25/100 mg 1-1/2 tablets 3 times daily. He continues to ambulate with a wheeled walker and feels his balance is poor. He has had a few falls in fact 9 in the last several months fortunately no major injury.   PAIN:  Are you having pain? No  Vitals:   03/06/24 1600  BP: 118/74  Pulse: 63     PRECAUTIONS:  Fall  FALLS: Has patient fallen in last 6 months? Reports 9 falls in the past year, reports most falls occur when going up on his toes   LIVING ENVIRONMENT: Lives with: lives with their spouse, daughter lives close by and son is moving closer  Lives in: House/apartment - will soon be downsizing to an apartment  Stairs: Yes: External: 4 huge stone steps steps; on right going up Has following equipment at home: Single point cane, Walker - 4 wheeled, and Grab bars  PLOF: Independent with household mobility with device, Independent with community mobility with device, Vocation/Vocational requirements: Currently still working, Theatre manager a Physiological scientist, and Leisure: collects books and baseball cards (has been collecting for 56 years)  No driving   PATIENT GOALS: I don't know, everyone just tells me I should come here   OBJECTIVE:  Note: Objective measures were completed at Evaluation unless otherwise noted.  DIAGNOSTIC FINDINGS: MRI brain 09/2021: IMPRESSION: This MRI of the brain with and without contrast shows the following: 1.   Remote left posterior cerebral artery infarction, remote right posterior inferior cerebellar artery infarction and remote lacunar infarction in the left posterior internal capsule.   2.   The brain was otherwise normal for age. 3.   No acute findings.  Normal enhancement pattern.    COGNITION: Overall cognitive status: Within functional limits for tasks assessed   SENSATION: Pt reports some numbness/tingling in RLE, doesn't happen all the time   COORDINATION: Heel to shin: WNL  OBSERVATIONS: Resting LUE tremor  Bilateral ankle swelling (as appt with PCP next month and going to discuss)   POSTURE: rounded shoulders, forward head, and weight shift left, head and trunk tilted to the L    LOWER EXTREMITY MMT:    MMT Right Eval Left Eval  Hip flexion 5 5  Hip extension  Hip abduction    Hip adduction    Hip internal rotation    Hip  external rotation    Knee flexion 5 5  Knee extension 5 4+  Ankle dorsiflexion 5 5  Ankle plantarflexion    Ankle inversion    Ankle eversion    (Blank rows = not tested)  BED MOBILITY:   Pt reports no difficulties   TRANSFERS: Sit to stand: SBA  Assistive device utilized: None     Stand to sit: SBA  Assistive device utilized: None      Initial cues for pt to scoot out to the edge before standing with no UE support    GAIT: Findings: Gait Characteristics: step through pattern, decreased arm swing- Left, decreased stride length, decreased hip/knee flexion- Right, decreased hip/knee flexion- Left, decreased ankle dorsiflexion- Right, decreased ankle dorsiflexion- Left, Right foot flat, Left foot flat, shuffling, lateral lean- Left, decreased trunk rotation, trunk flexed, poor foot clearance- Right, and poor foot clearance- Left, Distance walked: Clinic distances , Assistive device utilized:Single point cane, Level of assistance: SBA, and Comments: pt with R>L foot scuffing   FUNCTIONAL TESTS:  5 times sit to stand: 12.7 seconds with no UE support, wide BOS, decr forward lean with incr weight shifted onto heels  10 meter walk test: 11.3 seconds with SPC = 2.90 ft/sec                                                                                                                               TREATMENT DATE: 03/06/24  Self-Care: Educated regarding purpose of carbidopa  levodopa  in regards to idiopathic PD and that atypical Parkinsonisms such as vascular does not normally respond to medication, educated about taking it at the same time each day and not taking it at the same time as eating protein for incr effectiveness Pt asking about aquatic therapy - pt reports has access to a pool and wants to learn some exercises that he can do that will be beneficial for him. Discussed will get pt scheduled for a few appts apart of POC  Discussed rollator/RW would be the best AD at this time for pt if  pt has to hold onto walls when using [facility], will use these devices in future sessions  Educated on what neuro/PD specific PT will be working on    PATIENT EDUCATION: Education details: Clinical findings, POC  Person educated: Patient and pt's daughter  Education method: Explanation Education comprehension: verbalized understanding and needs further education  HOME EXERCISE PROGRAM: Will provide at future session   GOALS: Goals reviewed with patient? Yes  SHORT TERM GOALS: Target date: 04/03/2024  Pt will be independent with initial HEP for strength, gait, balance in order to build upon functional gains made in therapy. Baseline: Goal status: INITIAL  2.  FOG-Q to be assessed with LTG written. Baseline:  Goal status: INITIAL  3.  TUG/cog TUG to be assessed with LTG written.  Baseline:  Goal status: INITIAL  4.  Pt will be able to demo/verbalize understanding of freezing strategies.  Baseline:  Goal status: INITIAL  5.  Push and release to be assessed with STG/LTG written.  Baseline:  Goal status: INITIAL   LONG TERM GOALS: Target date: 05/01/2024  Pt will be independent with final land and aquatic HEP for strength, gait, balance in order to build upon functional gains made in therapy. Baseline:  Goal status: INITIAL  2.  Push and release test goal.  Baseline:  Goal status: INITIAL  3.  TUG/cog TUG goal.  Baseline:  Goal status: INITIAL  4.  FOG-Q goal  Baseline:  Goal status: INITIAL  5.  Pt will verbalize understanding of local Parkinson's disease resources, including options for continue community fitness.  Baseline:  Goal status: INITIAL  6.  Pt will improve gait speed with LRAD to at least 3.3 ft/sec in order to demo improved community mobility.   Baseline: 11.3 seconds with SPC = 2.90 ft/sec Goal status: INITIAL  ASSESSMENT:  CLINICAL IMPRESSION: Patient is a 78 year old male referred to Neuro OPPT for gait disorder/Parkinsonism.   Pt's PMH is  significant for:  Vascular Parkinsonism/Idiopathic PD, mild cognitive impairment, mild spinal stenosis and spondylosis, remote right PICA and left PCA infarcts in 1982 as well as left basal ganglia lacunar infarct in 2005 with mild residual right sided weakness . The following deficits were present during the exam: postural abnormalities, decr strength, impaired balance, gait abnormalities, LUE tremor, bradykinesia, decr safety awareness. Based on hx of fall risk, pt is an incr risk for falls. Pt taking incr time to perform a sit <> stand and needing cues to scoot out to the edge first. Pt would benefit from skilled PT to address these impairments and functional limitations to maximize functional mobility independence and decr fall risk.   OBJECTIVE IMPAIRMENTS: Abnormal gait, decreased activity tolerance, decreased balance, decreased coordination, decreased knowledge of condition, decreased knowledge of use of DME, decreased mobility, difficulty walking, decreased strength, decreased safety awareness, impaired flexibility, impaired sensation, impaired UE functional use, and postural dysfunction.   ACTIVITY LIMITATIONS: stairs, transfers, and locomotion level  PARTICIPATION LIMITATIONS: driving, shopping, and community activity  PERSONAL FACTORS: Age, Behavior pattern, Past/current experiences, Time since onset of injury/illness/exacerbation, and 3+ comorbidities:  Vascular Parkinsonism/Idiopathic PD, mild cognitive impairment, mild spinal stenosis and spondylosis, remote right PICA and left PCA infarcts in 1982 as well as left basal ganglia lacunar infarct in 2005 with mild residual right sided weakness  are also affecting patient's functional outcome.   REHAB POTENTIAL: Good  CLINICAL DECISION MAKING: Evolving/moderate complexity  EVALUATION COMPLEXITY: Moderate  PLAN:  PT FREQUENCY: 2x/week  PT DURATION: 8 weeks  PLANNED INTERVENTIONS: 97164- PT Re-evaluation, 97110-Therapeutic exercises,  97530- Therapeutic activity, 97112- Neuromuscular re-education, 97535- Self Care, [id number]- Manual therapy, [phone]- Gait training, Patient/Family education, Balance training, Stair training, Vestibular training, and DME instructions  PLAN FOR NEXT SESSION: assess TUG and cog TUG/push and release and FOG-Q and write goals. Initial HEP for standing weight shifting, foot clearance tasks, standing PWR moves. Work with use of rollator (pt has one at home). Educate on freezing strategies   Mention OT/ST and get referrals if pt agreeable.    [place]ffield LOISE Senate, PT, DPT 03/06/2024, 4:23 PM

## 2024-03-06 ENCOUNTER — Ambulatory Visit: Attending: Neurology | Admitting: Physical Therapy

## 2024-03-06 ENCOUNTER — Encounter: Payer: Self-pay | Admitting: Physical Therapy

## 2024-03-06 VITALS — BP 118/74 | HR 63

## 2024-03-06 DIAGNOSIS — R269 Unspecified abnormalities of gait and mobility: Secondary | ICD-10-CM | POA: Diagnosis not present

## 2024-03-06 DIAGNOSIS — R2681 Unsteadiness on feet: Secondary | ICD-10-CM | POA: Insufficient documentation

## 2024-03-06 DIAGNOSIS — R471 Dysarthria and anarthria: Secondary | ICD-10-CM | POA: Insufficient documentation

## 2024-03-06 DIAGNOSIS — R29898 Other symptoms and signs involving the musculoskeletal system: Secondary | ICD-10-CM | POA: Insufficient documentation

## 2024-03-06 DIAGNOSIS — R29818 Other symptoms and signs involving the nervous system: Secondary | ICD-10-CM | POA: Insufficient documentation

## 2024-03-06 DIAGNOSIS — R278 Other lack of coordination: Secondary | ICD-10-CM | POA: Insufficient documentation

## 2024-03-06 DIAGNOSIS — R2689 Other abnormalities of gait and mobility: Secondary | ICD-10-CM | POA: Diagnosis not present

## 2024-03-06 DIAGNOSIS — R293 Abnormal posture: Secondary | ICD-10-CM | POA: Insufficient documentation

## 2024-03-06 DIAGNOSIS — Z9181 History of falling: Secondary | ICD-10-CM | POA: Diagnosis not present

## 2024-03-13 ENCOUNTER — Ambulatory Visit: Admitting: Physical Therapy

## 2024-03-13 VITALS — BP 114/78 | HR 66

## 2024-03-13 DIAGNOSIS — R2681 Unsteadiness on feet: Secondary | ICD-10-CM

## 2024-03-13 DIAGNOSIS — R471 Dysarthria and anarthria: Secondary | ICD-10-CM | POA: Diagnosis not present

## 2024-03-13 DIAGNOSIS — R293 Abnormal posture: Secondary | ICD-10-CM | POA: Diagnosis not present

## 2024-03-13 DIAGNOSIS — Z9181 History of falling: Secondary | ICD-10-CM

## 2024-03-13 DIAGNOSIS — R2689 Other abnormalities of gait and mobility: Secondary | ICD-10-CM

## 2024-03-13 DIAGNOSIS — R29818 Other symptoms and signs involving the nervous system: Secondary | ICD-10-CM | POA: Diagnosis not present

## 2024-03-13 NOTE — Therapy (Signed)
 OUTPATIENT PHYSICAL THERAPY NEURO TREATMENT   Patient Name: Timothy Shields MRN: [id number] DOB:12/31/1945, 78 y.o., male Today's Date: 03/13/2024   PCP: Janey Santos, MD  REFERRING PROVIDER: Rosemarie Eather RAMAN, MD   END OF SESSION:  PT End of Session - 03/13/24 1406     Visit Number 2    Number of Visits 17    Date for PT Re-Evaluation 05/05/24    Authorization Type MEDICARE PART A AND B    PT Start Time 1404    PT Stop Time 1446    PT Time Calculation (min) 42 min    Equipment Utilized During Treatment --    Activity Tolerance Patient tolerated treatment well    Behavior During Therapy WFL for tasks assessed/performed           Past Medical History:  Diagnosis Date   Cerebrovascular disease    Headache(784.0)    Stroke (HCC)    3   Past Surgical History:  Procedure Laterality Date   HERNIA REPAIR     HERNIA REPAIR     Twice in the 1970's   Patient Active Problem List   Diagnosis Date Noted   Erectile dysfunction 09/13/2013   Other generalized ischemic cerebrovascular disease 07/10/2013   Other and unspecified hyperlipidemia 07/10/2013    ONSET DATE: 02/14/2024  REFERRING DIAG: R26.9 (ICD-10-CM) - Gait disorder   THERAPY DIAG:  Unsteadiness on feet  Other abnormalities of gait and mobility  History of falling  Abnormal posture  Rationale for Evaluation and Treatment: Rehabilitation  SUBJECTIVE:                                                                                                                                                                                             SUBJECTIVE STATEMENT: Pt presents with rollator. States he does not use it often at home, relies on furniture walking. Reports some pain in his R hip. Denies falls or acute changes since last session.   Pt accompanied by: self  PERTINENT HISTORY:    Per Dr. Rosemarie: remote right PICA and left PCA infarcts in 1982 as well as left basal ganglia lacunar infarct in 2005  with mild residual right sided weakness with recent increase complaints of worsening gait difficulties likely multifactorial due to combination of old strokes, poststroke parkinsonism and mild spinal stenosis and spondylosis. He also has mild cognitive impairment which appears quite stable    Patient has noticed increasing tremors in his left hand as well as some increase hypophonia and drooling.  He does have some difficulty at times getting up from the chair or wearing his clothes.  He was seen at [facility] neurology for consultation and started on Sinemet  25/100 mg 1-1/2 tablets 3 times daily. He continues to ambulate with a wheeled walker and feels his balance is poor. He has had a few falls in fact 9 in the last several months fortunately no major injury.   PAIN:  Are you having pain? Yes: NPRS scale: 1/10 Pain location: R hip Pain description: achy   Vitals:   03/13/24 1413  BP: 114/78  Pulse: 66      PRECAUTIONS: Fall  FALLS: Has patient fallen in last 6 months? Reports 9 falls in the past year, reports most falls occur when going up on his toes   LIVING ENVIRONMENT: Lives with: lives with their spouse, daughter lives close by and son is moving closer  Lives in: House/apartment - will soon be downsizing to an apartment  Stairs: Yes: External: 4 huge stone steps steps; on right going up Has following equipment at home: Single point cane, Walker - 4 wheeled, and Grab bars  PLOF: Independent with household mobility with device, Independent with community mobility with device, Vocation/Vocational requirements: Currently still working, Theatre manager a Physiological scientist, and Leisure: collects books and baseball cards (has been collecting for 56 years)  No driving   PATIENT GOALS: I don't know, everyone just tells me I should come here   OBJECTIVE:  Note: Objective measures were completed at Evaluation unless otherwise noted.  DIAGNOSTIC FINDINGS: MRI brain 09/2021: IMPRESSION:  This MRI of the brain with and without contrast shows the following: 1.   Remote left posterior cerebral artery infarction, remote right posterior inferior cerebellar artery infarction and remote lacunar infarction in the left posterior internal capsule.   2.   The brain was otherwise normal for age. 3.   No acute findings.  Normal enhancement pattern.    COGNITION: Overall cognitive status: Within functional limits for tasks assessed   SENSATION: Pt reports some numbness/tingling in RLE, doesn't happen all the time   COORDINATION: Heel to shin: WNL  OBSERVATIONS: Resting LUE tremor  Bilateral ankle swelling (as appt with PCP next month and going to discuss)   POSTURE: rounded shoulders, forward head, and weight shift left, head and trunk tilted to the L    LOWER EXTREMITY MMT:    MMT Right Eval Left Eval  Hip flexion 5 5  Hip extension    Hip abduction    Hip adduction    Hip internal rotation    Hip external rotation    Knee flexion 5 5  Knee extension 5 4+  Ankle dorsiflexion 5 5  Ankle plantarflexion    Ankle inversion    Ankle eversion    (Blank rows = not tested)  BED MOBILITY:   Pt reports no difficulties   TRANSFERS: Sit to stand: SBA  Assistive device utilized: None     Stand to sit: SBA  Assistive device utilized: None      Initial cues for pt to scoot out to the edge before standing with no UE support    GAIT: Findings: Gait Characteristics: step through pattern, decreased arm swing- Left, decreased stride length, decreased hip/knee flexion- Right, decreased hip/knee flexion- Left, decreased ankle dorsiflexion- Right, decreased ankle dorsiflexion- Left, Right foot flat, Left foot flat, shuffling, lateral lean- Left, decreased trunk rotation, trunk flexed, poor foot clearance- Right, and poor foot clearance- Left, Distance walked: Clinic distances , Assistive device utilized:Single point cane, Level of assistance: SBA, and Comments: pt with R>L foot  scuffing  FUNCTIONAL TESTS:  5 times sit to stand: 12.7 seconds with no UE support, wide BOS, decr forward lean with incr weight shifted onto heels  10 meter walk test: 11.3 seconds with SPC = 2.90 ft/sec   VITALS  Vitals:   03/13/24 1413  BP: 114/78  Pulse: 66                                                                                                                                TREATMENT:  Self-Care/Ther Act  Assessed vitals (see above) and WNL Discussed importance of using rollator at all times and not furniture walking, as pt is an increased fall risk and is safest with use of rollator. Pt reports people at work remind me to use my walker as he forgets it, but is in agreement that furniture walking is not the safest.   FOGQ- 14/24 (most difficulty with #1) Discussed exercises that pt currently performing with his personal trainer and pt reports he is working on rowing a 524m in <2:40 and performs a lot of weight machines, mostly for his legs. Discussed adding in either a stationary bike or assault bike as well as rowing and working with free weights, not just machines.  Provided pt w/handout on PD community resources and pt interested in [employer], so highlighted the contact info for pt and encouraged him to contact [place].  Introduced freezing strategies as pt has significant freezing in doorways and with turns. Educated pt on etiology of freezing and importance of focusing on lateral weight shifting and taking a large step. Will provide further education in future sessions.    PATIENT EDUCATION: Education details: see above   Person educated: Patient Education method: Explanation Education comprehension: verbalized understanding and needs further education  HOME EXERCISE PROGRAM: Will provide at future session   GOALS: Goals reviewed with patient? Yes  SHORT TERM GOALS: Target date: 04/03/2024  Pt will be independent with initial HEP for strength, gait,  balance in order to build upon functional gains made in therapy. Baseline: Goal status: INITIAL  2.  FOG-Q to be assessed with LTG written. Baseline:  Goal status: MET  3.  TUG/cog TUG to be assessed with LTG written.  Baseline:  Goal status: INITIAL  4.  Pt will be able to demo/verbalize understanding of freezing strategies.  Baseline:  Goal status: INITIAL  5.  Push and release to be assessed with STG/LTG written.  Baseline:  Goal status: INITIAL   LONG TERM GOALS: Target date: 05/01/2024  Pt will be independent with final land and aquatic HEP for strength, gait, balance in order to build upon functional gains made in therapy. Baseline:  Goal status: INITIAL  2.  Push and release test goal.  Baseline:  Goal status: INITIAL  3.  TUG/cog TUG goal.  Baseline:  Goal status: INITIAL  4.  Pt will score </= 12/24 on FOG-Q for improved implementation of freezing strategies and reduced  fall risk  Baseline: 14/24 Goal status: INITIAL  5.  Pt will verbalize understanding of local Parkinson's disease resources, including options for continue community fitness.  Baseline:  Goal status: INITIAL  6.  Pt will improve gait speed with LRAD to at least 3.3 ft/sec in order to demo improved community mobility.   Baseline: 11.3 seconds with SPC = 2.90 ft/sec Goal status: INITIAL  ASSESSMENT:  CLINICAL IMPRESSION: Emphasis of skilled PT session on pt education regarding PD, exercise and freezing as well as assessing impact freezing has on his ADLs via FOG-Q. Pt scored a 14/24 on FOG-Q, with a higher score indicative of worse freezing. Pt reports he is mostly triggered in doorways, so educated pt on importance of stopping, resetting and lateral weight shifting rather than reaching forward when he freezes. Pt will benefit from further education and practice in PT. Pt educated on benefit of working on reciprocal coordination, such as use of an assault bike or Scifit, to assist in neural  priming. Pt reports he will integrate this more into his workout sessions. Strongly encouraged pt to use rollator at all times, as he is unsafe w/cane or no AD. Continue POC.    OBJECTIVE IMPAIRMENTS: Abnormal gait, decreased activity tolerance, decreased balance, decreased coordination, decreased knowledge of condition, decreased knowledge of use of DME, decreased mobility, difficulty walking, decreased strength, decreased safety awareness, impaired flexibility, impaired sensation, impaired UE functional use, and postural dysfunction.   ACTIVITY LIMITATIONS: stairs, transfers, and locomotion level  PARTICIPATION LIMITATIONS: driving, shopping, and community activity  PERSONAL FACTORS: Age, Behavior pattern, Past/current experiences, Time since onset of injury/illness/exacerbation, and 3+ comorbidities:  Vascular Parkinsonism/Idiopathic PD, mild cognitive impairment, mild spinal stenosis and spondylosis, remote right PICA and left PCA infarcts in 1982 as well as left basal ganglia lacunar infarct in 2005 with mild residual right sided weakness  are also affecting patient's functional outcome.   REHAB POTENTIAL: Good  CLINICAL DECISION MAKING: Evolving/moderate complexity  EVALUATION COMPLEXITY: Moderate  PLAN:  PT FREQUENCY: 2x/week  PT DURATION: 8 weeks  PLANNED INTERVENTIONS: 97164- PT Re-evaluation, 97110-Therapeutic exercises, 97530- Therapeutic activity, 97112- Neuromuscular re-education, 97535- Self Care, 02859- Manual therapy, [phone]- Gait training, Patient/Family education, Balance training, Stair training, Vestibular training, and DME instructions  PLAN FOR NEXT SESSION: assess TUG and cog TUG/push and release and write goals. Initial HEP for standing weight shifting, foot clearance tasks, standing PWR moves. Work with use of rollator (pt has one at home). Educate on freezing strategies    Emmanuelle Coxe E Shakim Faith, PT, DPT 03/13/2024, 2:48 PM

## 2024-03-15 ENCOUNTER — Ambulatory Visit: Admitting: Physical Therapy

## 2024-03-15 DIAGNOSIS — R293 Abnormal posture: Secondary | ICD-10-CM

## 2024-03-15 DIAGNOSIS — Z9181 History of falling: Secondary | ICD-10-CM

## 2024-03-15 DIAGNOSIS — R2681 Unsteadiness on feet: Secondary | ICD-10-CM

## 2024-03-15 DIAGNOSIS — R2689 Other abnormalities of gait and mobility: Secondary | ICD-10-CM

## 2024-03-15 DIAGNOSIS — R29818 Other symptoms and signs involving the nervous system: Secondary | ICD-10-CM | POA: Diagnosis not present

## 2024-03-15 DIAGNOSIS — R471 Dysarthria and anarthria: Secondary | ICD-10-CM | POA: Diagnosis not present

## 2024-03-15 NOTE — Therapy (Signed)
 OUTPATIENT PHYSICAL THERAPY NEURO TREATMENT   Patient Name: Timothy Shields MRN: [id number] DOB:24-Jul-1946, 78 y.o., male Today's Date: 03/15/2024   PCP: Janey Santos, MD  REFERRING PROVIDER: Rosemarie Eather RAMAN, MD   END OF SESSION:  PT End of Session - 03/15/24 1452     Visit Number 3    Number of Visits 17    Date for PT Re-Evaluation 05/05/24    Authorization Type MEDICARE PART A AND B    PT Start Time 1449    PT Stop Time 1536    PT Time Calculation (min) 47 min    Equipment Utilized During Treatment Gait belt    Activity Tolerance Patient tolerated treatment well    Behavior During Therapy WFL for tasks assessed/performed           Past Medical History:  Diagnosis Date   Cerebrovascular disease    Headache(784.0)    Stroke (HCC)    3   Past Surgical History:  Procedure Laterality Date   HERNIA REPAIR     HERNIA REPAIR     Twice in the 1970's   Patient Active Problem List   Diagnosis Date Noted   Erectile dysfunction 09/13/2013   Other generalized ischemic cerebrovascular disease 07/10/2013   Other and unspecified hyperlipidemia 07/10/2013    ONSET DATE: 02/14/2024  REFERRING DIAG: R26.9 (ICD-10-CM) - Gait disorder   THERAPY DIAG:  Unsteadiness on feet  Other abnormalities of gait and mobility  History of falling  Abnormal posture  Rationale for Evaluation and Treatment: Rehabilitation  SUBJECTIVE:                                                                                                                                                                                             SUBJECTIVE STATEMENT: Pt presents with rollator. Denies falls or acute changes. Reports he spoke to his personal trainer about using free weights and trainer in agreement. Still having severe R hip pain if he bends forward, but otherwise is tolerable.   Pt accompanied by: self  PERTINENT HISTORY:    Per Dr. Rosemarie: remote right PICA and left PCA infarcts in  1982 as well as left basal ganglia lacunar infarct in 2005 with mild residual right sided weakness with recent increase complaints of worsening gait difficulties likely multifactorial due to combination of old strokes, poststroke parkinsonism and mild spinal stenosis and spondylosis. He also has mild cognitive impairment which appears quite stable    Patient has noticed increasing tremors in his left hand as well as some increase hypophonia and drooling.  He does have some difficulty at times getting up from  the chair or wearing his clothes.  He was seen at [facility] neurolog[facility] for consultation and started on Sinemet  25/100 mg 1-1/2 tablets 3 times daily. He continues to ambulate with a wheeled walker and feels his balance is poor. He has had a few falls in fact 9 in the last several months fortunately no major injury.   PAIN:  Are you having pain? Yes: NPRS scale: 1/10 Pain location: R hip Pain description: achy   There were no vitals filed for this visit.     PRECAUTIONS: Fall  FALLS: Has patient fallen in last 6 months? Reports 9 falls in the past year, reports most falls occur when going up on his toes   LIVING ENVIRONMENT: Lives with: lives with their spouse, daughter lives close by and son is moving closer  Lives in: House/apartment - will soon be downsizing to an apartment  Stairs: Yes: External: 4 huge stone steps steps; on right going up Has following equipment at home: Single point cane, Walker - 4 wheeled, and Grab bars  PLOF: Independent with household mobility with device, Independent with community mobility with device, Vocation/Vocational requirements: Currently still working, Theatre manager a Physiological scientist, and Leisure: collects books and baseball cards (has been collecting for 56 years)  No driving   PATIENT GOALS: I don't know, everyone just tells me I should come here   OBJECTIVE:  Note: Objective measures were completed at Evaluation unless otherwise  noted.  DIAGNOSTIC FINDINGS: MRI brain 09/2021: IMPRESSION: This MRI of the brain with and without contrast shows the following: 1.   Remote left posterior cerebral artery infarction, remote right posterior inferior cerebellar artery infarction and remote lacunar infarction in the left posterior internal capsule.   2.   The brain was otherwise normal for age. 3.   No acute findings.  Normal enhancement pattern.    COGNITION: Overall cognitive status: Within functional limits for tasks assessed   SENSATION: Pt reports some numbness/tingling in RLE, doesn't happen all the time   COORDINATION: Heel to shin: WNL  OBSERVATIONS: Resting LUE tremor  Bilateral ankle swelling (as appt with PCP next month and going to discuss)   POSTURE: rounded shoulders, forward head, and weight shift left, head and trunk tilted to the L    LOWER EXTREMITY MMT:    MMT Right Eval Left Eval  Hip flexion 5 5  Hip extension    Hip abduction    Hip adduction    Hip internal rotation    Hip external rotation    Knee flexion 5 5  Knee extension 5 4+  Ankle dorsiflexion 5 5  Ankle plantarflexion    Ankle inversion    Ankle eversion    (Blank rows = not tested)  BED MOBILITY:   Pt reports no difficulties   TRANSFERS: Sit to stand: SBA  Assistive device utilized: None     Stand to sit: SBA  Assistive device utilized: None      Initial cues for pt to scoot out to the edge before standing with no UE support    FUNCTIONAL TESTS:  5 times sit to stand: 12.7 seconds with no UE support, wide BOS, decr forward lean with incr weight shifted onto heels  10 meter walk test: 11.3 seconds with SPC = 2.90 ft/sec   VITALS  There were no vitals filed for this visit.  TREATMENT:  Ther Act/NMR  SciFit multi-peaks level 8.0 for 8 minutes using BUE/BLEs for neural priming for  reciprocal movement, dynamic cardiovascular warmup and increased amplitude of stepping. Cued to maintain steps/min >75 throughout, which pt performed well. Noted L truncal lean during activity. RPE of 3/10 following activity  Reviewed freezing strategies and provided pt w/printed handout on tips to reduce freezing episodes with standing or walking. Emphasized importance of stopping and resetting when the freeze happens rather than fight the freeze. Pt reports he is guilty of fighting the freeze and reaching for something in front of him to catch his balance. Practiced performing standing weight shifts in stance to imitate stop, reset and march in place to manage a freeze. Had pt walk around gym without AD into various doorways to attempt to trigger freezing to work on strategies, but pt did not freeze today.      [facility] PT Assessment - 03/15/24 1513       Balance   Balance Assessed Yes      Standardized Balance Assessment   Standardized Balance Assessment Timed Up and Go Test      Timed Up and Go Test   Normal TUG (seconds) 13.53   no AD   Cognitive TUG (seconds) 11.81   retro counting by 3s        At counter, alt side step w/contralateral OH reach to target, x10 reps per side for improved reciprocal coordination, step length/clearance, lateral weight shifting and truncal mobility. Verbal cues required to take one large step rather than several shuffled steps w/movement as well as to maintain reciprocal coordination, especially when stepping w/LLE. Pt performed well when cues applied.  Continue to educate pt on idiopathic vs atypical PD, role of dopamine and role of PT in treatment for PD. Pt verbalized understanding.   Gait pattern: step through pattern, decreased step length- Right, decreased stride length, decreased hip/knee flexion- Right, decreased ankle dorsiflexion- Right, festinating, lateral hip instability, lateral lean- Left, trunk flexed, and poor foot clearance- Right Distance  walked: Various clinic distances and through doorways  Assistive device utilized: Walker - 4 wheeled and None Level of assistance: SBA, CGA, and Min A Comments: Min cues to maintain walker close to body throughout session, as pt frequently walks off without walker and needs a reminder to bring it. Had pt work on freezing strategies without AD, but no freezing noted today. Pt had single instance of anterior LOB due to stepping on his L foot, requiring min A to prevent fall.     PATIENT EDUCATION: Education details: freezing strategies, results of TUG, continued education on PD and PT Person educated: Patient Education method: Explanation, Demonstration, and Verbal cues Education comprehension: verbalized understanding, returned demonstration, verbal cues required, and needs further education  HOME EXERCISE PROGRAM: Will provide at future session   GOALS: Goals reviewed with patient? Yes  SHORT TERM GOALS: Target date: 04/03/2024  Pt will be independent with initial HEP for strength, gait, balance in order to build upon functional gains made in therapy. Baseline: Goal status: INITIAL  2.  FOG-Q to be assessed with LTG written. Baseline:  Goal status: MET  3.  TUG/cog TUG to be assessed with LTG written.  Baseline:  Goal status: MET  4.  Pt will be able to demo/verbalize understanding of freezing strategies.  Baseline:  Goal status: INITIAL  5.  Push and release to be assessed with STG/LTG written.  Baseline:  Goal status: INITIAL   LONG TERM GOALS: Target date: 05/01/2024  Pt will be independent with final land and aquatic HEP for strength, gait, balance in order to build upon functional gains made in therapy. Baseline:  Goal status: INITIAL  2.  Push and release test goal.  Baseline:  Goal status: INITIAL  3.  Pt will improve TUG to less than or equal to 11 seconds w/LRAD for improved functional mobility and decreased fall risk.  Baseline: 13.53s no AD (7/10) Goal  status: REVISED  4.  Pt will score </= 12/24 on FOG-Q for improved implementation of freezing strategies and reduced fall risk  Baseline: 14/24 Goal status: INITIAL  5.  Pt will verbalize understanding of local Parkinson's disease resources, including options for continue community fitness.  Baseline:  Goal status: INITIAL  6.  Pt will improve gait speed with LRAD to at least 3.3 ft/sec in order to demo improved community mobility.   Baseline: 11.3 seconds with SPC = 2.90 ft/sec Goal status: INITIAL  ASSESSMENT:  CLINICAL IMPRESSION: Emphasis of skilled PT session on assessing balance via TUG, reciprocal coordination, education on freezing strategies and role of PT w/PD. Pt performed TUG in >13s without AD, indicative of elevated fall risk. Pt performed cog TUG more quickly than normal TUG and reports he good at counting. Reviewed freezing strategies with pt but unable to fully demonstrate in clinic as pt not freezing today. Pt does require min cues to properly use rollator and avoid leaving it behind, which he reports happens at work too. Continue POC.    OBJECTIVE IMPAIRMENTS: Abnormal gait, decreased activity tolerance, decreased balance, decreased coordination, decreased knowledge of condition, decreased knowledge of use of DME, decreased mobility, difficulty walking, decreased strength, decreased safety awareness, impaired flexibility, impaired sensation, impaired UE functional use, and postural dysfunction.   ACTIVITY LIMITATIONS: stairs, transfers, and locomotion level  PARTICIPATION LIMITATIONS: driving, shopping, and community activity  PERSONAL FACTORS: Age, Behavior pattern, Past/current experiences, Time since onset of injury/illness/exacerbation, and 3+ comorbidities:  Vascular Parkinsonism/Idiopathic PD, mild cognitive impairment, mild spinal stenosis and spondylosis, remote right PICA and left PCA infarcts in 1982 as well as left basal ganglia lacunar infarct in 2005 with  mild residual right sided weakness  are also affecting patient's functional outcome.   REHAB POTENTIAL: Good  CLINICAL DECISION MAKING: Evolving/moderate complexity  EVALUATION COMPLEXITY: Moderate  PLAN:  PT FREQUENCY: 2x/week  PT DURATION: 8 weeks  PLANNED INTERVENTIONS: 97164- PT Re-evaluation, 97110-Therapeutic exercises, 97530- Therapeutic activity, 97112- Neuromuscular re-education, 97535- Self Care, [id number]- Manual therapy, [phone]- Gait training, Patient/Family education, Balance training, Stair training, Vestibular training, and DME instructions  PLAN FOR NEXT SESSION: assess push and release and write goal. Initial HEP for standing weight shifting, foot clearance tasks, standing PWR moves. Work with use of rollator (pt has one at home). Continue to educate on freezing strategies    Galit Urich E Teighlor Korson, PT, DPT 03/15/2024, 3:39 PM

## 2024-03-19 ENCOUNTER — Ambulatory Visit: Admitting: Physical Therapy

## 2024-03-19 DIAGNOSIS — R2681 Unsteadiness on feet: Secondary | ICD-10-CM | POA: Diagnosis not present

## 2024-03-19 DIAGNOSIS — R293 Abnormal posture: Secondary | ICD-10-CM | POA: Diagnosis not present

## 2024-03-19 DIAGNOSIS — Z9181 History of falling: Secondary | ICD-10-CM

## 2024-03-19 DIAGNOSIS — R29818 Other symptoms and signs involving the nervous system: Secondary | ICD-10-CM | POA: Diagnosis not present

## 2024-03-19 DIAGNOSIS — R471 Dysarthria and anarthria: Secondary | ICD-10-CM | POA: Diagnosis not present

## 2024-03-19 DIAGNOSIS — R2689 Other abnormalities of gait and mobility: Secondary | ICD-10-CM

## 2024-03-19 NOTE — Therapy (Signed)
 OUTPATIENT PHYSICAL THERAPY NEURO TREATMENT   Patient Name: Timothy Shields MRN: [id number] DOB:09-16-1945, 78 y.o., male Today's Date: 03/19/2024   PCP: Janey Santos, MD  REFERRING PROVIDER: Rosemarie Eather RAMAN, MD   END OF SESSION:  PT End of Session - 03/19/24 1405     Visit Number 4    Number of Visits 17    Date for PT Re-Evaluation 05/05/24    Authorization Type MEDICARE PART A AND B    PT Start Time 1403    PT Stop Time 1443    PT Time Calculation (min) 40 min    Equipment Utilized During Treatment Gait belt    Activity Tolerance Patient tolerated treatment well    Behavior During Therapy WFL for tasks assessed/performed           Past Medical History:  Diagnosis Date   Cerebrovascular disease    Headache(784.0)    Stroke (HCC)    3   Past Surgical History:  Procedure Laterality Date   HERNIA REPAIR     HERNIA REPAIR     Twice in the 1970's   Patient Active Problem List   Diagnosis Date Noted   Erectile dysfunction 09/13/2013   Other generalized ischemic cerebrovascular disease 07/10/2013   Other and unspecified hyperlipidemia 07/10/2013    ONSET DATE: 02/14/2024  REFERRING DIAG: R26.9 (ICD-10-CM) - Gait disorder   THERAPY DIAG:  Unsteadiness on feet  Other abnormalities of gait and mobility  Abnormal posture  History of falling  Rationale for Evaluation and Treatment: Rehabilitation  SUBJECTIVE:                                                                                                                                                                                             SUBJECTIVE STATEMENT: Pt presents with rollator. Denies falls but reports a few near misses. Has been working on his freezing strategies at home and they are very helpful if he takes his time, but if he rushes he stays frozen. Denies pain today.   Pt accompanied by: self  PERTINENT HISTORY:    Per Dr. Rosemarie: remote right PICA and left PCA infarcts in 1982 as  well as left basal ganglia lacunar infarct in 2005 with mild residual right sided weakness with recent increase complaints of worsening gait difficulties likely multifactorial due to combination of old strokes, poststroke parkinsonism and mild spinal stenosis and spondylosis. He also has mild cognitive impairment which appears quite stable    Patient has noticed increasing tremors in his left hand as well as some increase hypophonia and drooling.  He does have some difficulty at times  getting up from the chair or wearing his clothes.  He was seen at [facility] neurology for consultation and started on Sinemet  25/100 mg 1-1/2 tablets 3 times daily. He continues to ambulate with a wheeled walker and feels his balance is poor. He has had a few falls in fact 9 in the last several months fortunately no major injury.   PAIN:  Are you having pain? Yes: NPRS scale: 1/10 Pain location: R hip Pain description: achy   There were no vitals filed for this visit.     PRECAUTIONS: Fall  FALLS: Has patient fallen in last 6 months? Reports 9 falls in the past year, reports most falls occur when going up on his toes   LIVING ENVIRONMENT: Lives with: lives with their spouse, daughter lives close by and son is moving closer  Lives in: House/apartment - will soon be downsizing to an apartment  Stairs: Yes: External: 4 huge stone steps steps; on right going up Has following equipment at home: Single point cane, Walker - 4 wheeled, and Grab bars  PLOF: Independent with household mobility with device, Independent with community mobility with device, Vocation/Vocational requirements: Currently still working, Theatre manager a Physiological scientist, and Leisure: collects books and baseball cards (has been collecting for 56 years)  No driving   PATIENT GOALS: I don't know, everyone just tells me I should come here   OBJECTIVE:  Note: Objective measures were completed at Evaluation unless otherwise noted.  DIAGNOSTIC  FINDINGS: MRI brain 09/2021: IMPRESSION: This MRI of the brain with and without contrast shows the following: 1.   Remote left posterior cerebral artery infarction, remote right posterior inferior cerebellar artery infarction and remote lacunar infarction in the left posterior internal capsule.   2.   The brain was otherwise normal for age. 3.   No acute findings.  Normal enhancement pattern.    COGNITION: Overall cognitive status: Within functional limits for tasks assessed   SENSATION: Pt reports some numbness/tingling in RLE, doesn't happen all the time   COORDINATION: Heel to shin: WNL  OBSERVATIONS: Resting LUE tremor  Bilateral ankle swelling (as appt with PCP next month and going to discuss)   POSTURE: rounded shoulders, forward head, and weight shift left, head and trunk tilted to the L    LOWER EXTREMITY MMT:    MMT Right Eval Left Eval  Hip flexion 5 5  Hip extension    Hip abduction    Hip adduction    Hip internal rotation    Hip external rotation    Knee flexion 5 5  Knee extension 5 4+  Ankle dorsiflexion 5 5  Ankle plantarflexion    Ankle inversion    Ankle eversion    (Blank rows = not tested)  BED MOBILITY:   Pt reports no difficulties   TRANSFERS: Sit to stand: SBA  Assistive device utilized: None     Stand to sit: SBA  Assistive device utilized: None      Initial cues for pt to scoot out to the edge before standing with no UE support    FUNCTIONAL TESTS:  5 times sit to stand: 12.7 seconds with no UE support, wide BOS, decr forward lean with incr weight shifted onto heels  10 meter walk test: 11.3 seconds with SPC = 2.90 ft/sec   VITALS  There were no vitals filed for this visit.  TREATMENT:  Ther Act/NMR  SciFit multi-peaks level 8.5 for 8 minutes using BUE/BLEs for neural priming for reciprocal movement,  dynamic cardiovascular warmup and increased amplitude of stepping. Cued to maintain steps/min >75 throughout, which pt performed well. Noted L truncal lean during activity. RPE of 3/10 following activity  Sit to stands w/forefeet elevated on blue wedge, x8 reps, for improved retropulsion correction and anterior weight shift. Pt performed well, so added 10# slam ball throw w/each rep for added anticipatory balance strategies x8 reps. Pt w/single episode of retropulsion on first rep but no LOB w/remainder of reps. CGA throughout for safety.  Clock yourself on 2 minute intervals for improved reactive balance, stepping strategies, lateral weight shifting and step length/clearance:  Round 1: simple clock at 30 spm. Increased difficulty w/retro stepping. CGA throughout  Round 2: simple clock at 20 spm w/6# slam ball throw w/each step. CGA throughout   6 Blaze pods on random reach setting for improved lateral weight shifting, single leg stability, LE coordination and increased step clearance.  Performed on 2 minute intervals with 2 minute rest periods.  Pt requires CGA-min A guarding. Round 1:  had pt stand on airex in front of steps and placed 3 pods on 1st step and 3 pods on 2nd step.  44 hits w/no UE support. Round 2:  same setup but added cog dual task (naming foods if pod is pink and animals if pod is blue).  23 hits  Notable errors/deficits:  Pt required CGA-heavy min A throughout due to anterior instability and decreased step clearance w/RLE. Pt frequently missing pod w/RLE and attempting to perform crossover taps rather than tap w/LLE     Gait pattern: step through pattern, decreased step length- Right, decreased stride length, decreased hip/knee flexion- Right, decreased ankle dorsiflexion- Right, festinating, lateral hip instability, lateral lean- Left, trunk flexed, and poor foot clearance- Right Distance walked: Various clinic distances and through doorways  Assistive device utilized: Environmental consultant - 4  wheeled and None Level of assistance: SBA and CGA Comments: No catching of foot noted this date but pt requires mod cues to facilitate increased step clearance w/RLE     PATIENT EDUCATION: Education details: Next appointment date and time, taking LARGE steps w/RLE during gait Person educated: Patient Education method: Programmer, multimedia, Demonstration, and Verbal cues Education comprehension: verbalized understanding, returned demonstration, verbal cues required, and needs further education  HOME EXERCISE PROGRAM: Will provide at future session   GOALS: Goals reviewed with patient? Yes  SHORT TERM GOALS: Target date: 04/03/2024  Pt will be independent with initial HEP for strength, gait, balance in order to build upon functional gains made in therapy. Baseline: Goal status: INITIAL  2.  FOG-Q to be assessed with LTG written. Baseline:  Goal status: MET  3.  TUG/cog TUG to be assessed with LTG written.  Baseline:  Goal status: MET  4.  Pt will be able to demo/verbalize understanding of freezing strategies.  Baseline:  Goal status: INITIAL  5.  Push and release to be assessed with STG/LTG written.  Baseline:  Goal status: INITIAL   LONG TERM GOALS: Target date: 05/01/2024  Pt will be independent with final land and aquatic HEP for strength, gait, balance in order to build upon functional gains made in therapy. Baseline:  Goal status: INITIAL  2.  Push and release test goal.  Baseline:  Goal status: INITIAL  3.  Pt will improve TUG to less than or equal to 11 seconds w/LRAD for improved functional mobility and  decreased fall risk.  Baseline: 13.53s no AD (7/10) Goal status: REVISED  4.  Pt will score </= 12/24 on FOG-Q for improved implementation of freezing strategies and reduced fall risk  Baseline: 14/24 Goal status: INITIAL  5.  Pt will verbalize understanding of local Parkinson's disease resources, including options for continue community fitness.  Baseline:   Goal status: INITIAL  6.  Pt will improve gait speed with LRAD to at least 3.3 ft/sec in order to demo improved community mobility.   Baseline: 11.3 seconds with SPC = 2.90 ft/sec Goal status: INITIAL  ASSESSMENT:  CLINICAL IMPRESSION: Emphasis of skilled PT session on reciprocal coordination, reactive and anticipatory balance strategies, lateral weight shifting and increased step length/clearance. Pt reports he has been implementing freezing strategies at home and they are helpful as long as he takes his time. Pt most challenged by retro stepping and step clearance w/RLE but does improve w/verbal cues to facilitate increased step length/clearance. Continue POC.    OBJECTIVE IMPAIRMENTS: Abnormal gait, decreased activity tolerance, decreased balance, decreased coordination, decreased knowledge of condition, decreased knowledge of use of DME, decreased mobility, difficulty walking, decreased strength, decreased safety awareness, impaired flexibility, impaired sensation, impaired UE functional use, and postural dysfunction.   ACTIVITY LIMITATIONS: stairs, transfers, and locomotion level  PARTICIPATION LIMITATIONS: driving, shopping, and community activity  PERSONAL FACTORS: Age, Behavior pattern, Past/current experiences, Time since onset of injury/illness/exacerbation, and 3+ comorbidities:  Vascular Parkinsonism/Idiopathic PD, mild cognitive impairment, mild spinal stenosis and spondylosis, remote right PICA and left PCA infarcts in 1982 as well as left basal ganglia lacunar infarct in 2005 with mild residual right sided weakness  are also affecting patient's functional outcome.   REHAB POTENTIAL: Good  CLINICAL DECISION MAKING: Evolving/moderate complexity  EVALUATION COMPLEXITY: Moderate  PLAN:  PT FREQUENCY: 2x/week  PT DURATION: 8 weeks  PLANNED INTERVENTIONS: 97164- PT Re-evaluation, 97110-Therapeutic exercises, 97530- Therapeutic activity, 97112- Neuromuscular re-education,  97535- Self Care, 02859- Manual therapy, [id number]- Gait training, Patient/Family education, Balance training, Stair training, Vestibular training, and DME instructions  PLAN FOR NEXT SESSION: assess push and release and write goal. Initial HEP for standing weight shifting, foot clearance tasks, standing PWR moves. Work with use of rollator (pt has one at home). Continue to educate on freezing strategies    Rosabella Edgin E Moise Friday, PT, DPT 03/19/2024, 2:45 PM

## 2024-03-21 NOTE — Therapy (Unsigned)
 OUTPATIENT OCCUPATIONAL THERAPY PARKINSON'S EVALUATION  Patient Name: Timothy Shields MRN: [id number] DOB:[birth year], 78 y.o., male Today's Date: 03/23/2024  PCP: Janey Santos, MD  REFERRING PROVIDER: Morita, Hokuto, MD  END OF SESSION:  OT End of Session - 03/22/24 1453     Visit Number 1    Number of Visits 17   max number of visits possible   Date for OT Re-Evaluation 06/22/24    Authorization Type Medicare    OT Start Time 1452    OT Stop Time 1532    OT Time Calculation (min) 40 min    Activity Tolerance Patient tolerated treatment well    Behavior During Therapy [facility] for tasks assessed/performed         Past Medical History:  Diagnosis Date   Cerebrovascular disease    Headache(784.0)    Stroke (HCC)    3   Past Surgical History:  Procedure Laterality Date   HERNIA REPAIR     HERNIA REPAIR     Twice in the 1970's   Patient Active Problem List   Diagnosis Date Noted   Erectile dysfunction 09/13/2013   Other generalized ischemic cerebrovascular disease 07/10/2013   Other and unspecified hyperlipidemia 07/10/2013   ONSET DATE: 03/01/2024 (Date of referral)  REFERRING DIAG: G20.A1 (ICD-10-CM) - Parkinson's disease without dyskinesia, without mention of fluctuations  THERAPY DIAG:  Other lack of coordination  Other symptoms and signs involving the musculoskeletal system  Other symptoms and signs involving the nervous system  Rationale for Evaluation and Treatment: Rehabilitation  SUBJECTIVE:   SUBJECTIVE STATEMENT: He feels that he is doing better after starting Carbidopa , especially with this balance. He has been taking some steps at home without the use of AD.   Pt accompanied by: self  PERTINENT HISTORY: Per Dr. Rosemarie: remote right PICA and left PCA infarcts in 1982 as well as left basal ganglia lacunar infarct in 2005 with mild residual right sided weakness with recent increase complaints of worsening gait difficulties likely multifactorial due  to combination of old strokes, poststroke parkinsonism and mild spinal stenosis and spondylosis. He also has mild cognitive impairment which appears quite stable     Patient has noticed increasing tremors in his left hand as well as some increase hypophonia and drooling.  He does have some difficulty at times getting up from the chair or wearing his clothes.  He was seen at [facility] neurology for consultation and started on Sinemet  25/100 mg 1-1/2 tablets 3 times daily. He continues to ambulate with a wheeled walker and feels his balance is poor. He has had a few falls in fact 9 in the last several months fortunately no major injury.   Had a driving evaluation from [facility] and was told not to drive. Has had 9 falls in the past year. Goes to a Systems analyst 3x a week.   PRECAUTIONS: Fall  WEIGHT BEARING RESTRICTIONS: No  PAIN:  Are you having pain? No  FALLS: Has patient fallen in last 6 months? Yes. 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  HAND DOMINANCE: Right  ADLs: Overall ADLs: mostly mod I; requires assistance with buttons, especially with sleeve cuffs  IADLs: Handwriting: 90% legible, Severe micrographia, and pt reports he has always written  small, but it has gotten even smaller. It is better if he thinks about what he is going to write and takes his time. There are times that he writes something down and then is unable to determine what he wrote.   MOBILITY STATUS: See PT notes  POSTURE COMMENTS:  rounded shoulders, forward head, and weight shift left, head and trunk tilted to the L   ACTIVITY TOLERANCE: Activity tolerance: good to fair  FUNCTIONAL OUTCOME MEASURES: Fastening/unfastening 3 buttons: TBD Physical performance test: PPT#2 (simulated eating) TBD & PPT#4 (donning/doffing jacket): TBD  COORDINATION: 9 Hole Peg test: Right: TBD sec; Left: TBD sec  UE ROM:  TBD  MUSCLE TONE: TBD  COGNITION: Overall cognitive status: History of cognitive impairments - at baseline  OBSERVATIONS: resting LUE tremor; impulsive; mild bradykinesia                                                                                                                   TREATMENT: N/A for this visit    PATIENT EDUCATION: Education details: OT Role and POC Person educated: Patient Education method: Explanation Education comprehension: verbalized understanding  HOME EXERCISE PROGRAM: 03/23/2024: handwriting samples  GOALS:  SHORT TERM GOALS: Target date: 04/20/2024    Pt will be independent with PD specific HEP.  Baseline: not yet initiated Goal status: INITIAL  2.  Pt will verbalize understanding of adapted strategies to maximize safety and independence with ADLs/IADLs.  Baseline: not yet initiated Goal status: INITIAL  3.  Will set appropriate coordination based goal. Baseline: not yet initiated Goal status: INITIAL  LONG TERM GOALS: Target date: 06/22/24    Pt will verbalize understanding of ways to prevent future PD related complications and PD community resources.  Baseline: not yet initiated Goal status: INITIAL  2.  Pt will write a short paragraph with no significant decrease in size and maintain 100%  legibility.  Baseline: 90% legibility severe micrographia Goal status: INITIAL  3.  Pt will verbalize understanding of ways to keep thinking skills sharp and ways to compensate for STM changes in the future.  Baseline: not yet initiated Goal status: INITIAL  4.   5.  Pt will demonstrate increased ease with dressing as evidenced by decreasing PPT#4 (don/ doff jacket) by 5 secs or more as appropriate.  Baseline: TBD Goal status: INITIAL  6.  Pt will demonstrate improved fine motor coordination for ADLs as evidenced by decreasing 9 hole peg test score for each hand by at least 5 secs or more as appropriate.  Baseline: TBD Goal status: INITIAL   7.  Pt will demonstrate improved ease with fastening buttons as evidenced by decreasing 3 button/unbutton time by at least 5 seconds or more as appropriate.  Baseline: TBD Goal status: INITIAL  8.  Pt will be able to place at least 5 blocks using each hand with completion of Box and Blocks test or more as appropriate..  Baseline: TBD Goal status: INITIAL   ASSESSMENT:  CLINICAL IMPRESSION: Patient is a 78 y.o. male who was seen today for occupational therapy evaluation for PD management. Hx includes Vascular Parkinsonism/Idiopathic PD, mild cognitive impairment, mild spinal stenosis and spondylosis, remote right PICA and left PCA infarcts in 1982 as well as left basal ganglia lacunar infarct in 2005 with mild residual right sided weakness . Patient presents to clinic seeking skilled therapy services to better manage occupational barriers secondary to Parkinson's and improve independence and safety with ADLs and IADLs.    PERFORMANCE DEFICITS: in functional skills including ADLs, IADLs, coordination, ROM, Fine motor control, Gross motor control, mobility, balance, decreased knowledge of precautions, decreased knowledge of use of DME, and UE functional use.   IMPAIRMENTS: are limiting patient from ADLs, IADLs, rest and sleep, leisure, and  social participation.   COMORBIDITIES:  may have co-morbidities  that affects occupational performance. Patient will benefit from skilled OT to address above impairments and improve overall function.  MODIFICATION OR ASSISTANCE TO COMPLETE EVALUATION: Min-Moderate modification of tasks or assist with assess necessary to complete an evaluation.  OT OCCUPATIONAL PROFILE AND HISTORY: Detailed assessment: Review of records and additional review of physical, cognitive, psychosocial history related to current functional performance.  CLINICAL DECISION MAKING: Moderate - several treatment options, min-mod task modification necessary  REHAB POTENTIAL: Good  EVALUATION COMPLEXITY: Moderate    PLAN:  OT FREQUENCY: 2x/week  OT DURATION: 8 weeks  PLANNED INTERVENTIONS: 97168 OT Re-evaluation, 97535 self care/ADL training, 02889 therapeutic exercise, 97530 therapeutic activity, 97112 neuromuscular re-education, functional mobility training, coping strategies training, patient/family education, and DME and/or AE instructions  RECOMMENDED OTHER SERVICES: N/A for this visit  CONSULTED AND AGREED WITH PLAN OF CARE: Patient  PLAN FOR NEXT SESSION: Complete PD objective measures (encourage pt to speak LOUDLY) and update goals; Initiate PD HEP   Jocelyn CHRISTELLA Bottom, OT 03/23/2024, 3:34 PM

## 2024-03-22 ENCOUNTER — Ambulatory Visit: Admitting: Occupational Therapy

## 2024-03-22 ENCOUNTER — Ambulatory Visit: Admitting: Physical Therapy

## 2024-03-22 ENCOUNTER — Encounter: Payer: Self-pay | Admitting: Occupational Therapy

## 2024-03-22 DIAGNOSIS — R278 Other lack of coordination: Secondary | ICD-10-CM

## 2024-03-22 DIAGNOSIS — R29898 Other symptoms and signs involving the musculoskeletal system: Secondary | ICD-10-CM

## 2024-03-22 DIAGNOSIS — R29818 Other symptoms and signs involving the nervous system: Secondary | ICD-10-CM | POA: Diagnosis not present

## 2024-03-22 DIAGNOSIS — R2681 Unsteadiness on feet: Secondary | ICD-10-CM

## 2024-03-22 DIAGNOSIS — Z9181 History of falling: Secondary | ICD-10-CM | POA: Diagnosis not present

## 2024-03-22 DIAGNOSIS — R2689 Other abnormalities of gait and mobility: Secondary | ICD-10-CM

## 2024-03-22 DIAGNOSIS — R471 Dysarthria and anarthria: Secondary | ICD-10-CM | POA: Diagnosis not present

## 2024-03-22 DIAGNOSIS — R293 Abnormal posture: Secondary | ICD-10-CM | POA: Diagnosis not present

## 2024-03-22 NOTE — Therapy (Signed)
 OUTPATIENT PHYSICAL THERAPY NEURO TREATMENT   Patient Name: Timothy Shields MRN: [id number] DOB:07-08-1946, 78 y.o., male Today's Date: 03/22/2024   PCP: Janey Santos, MD  REFERRING PROVIDER: Rosemarie Eather RAMAN, MD   END OF SESSION:  PT End of Session - 03/22/24 1540     Visit Number 5    Number of Visits 17    Date for PT Re-Evaluation 05/05/24    Authorization Type MEDICARE PART A AND B    PT Start Time 1536   Handoff w/OT   PT Stop Time 1617    PT Time Calculation (min) 41 min    Equipment Utilized During Treatment Gait belt    Activity Tolerance Patient tolerated treatment well    Behavior During Therapy WFL for tasks assessed/performed            Past Medical History:  Diagnosis Date   Cerebrovascular disease    Headache(784.0)    Stroke (HCC)    3   Past Surgical History:  Procedure Laterality Date   HERNIA REPAIR     HERNIA REPAIR     Twice in the 1970's   Patient Active Problem List   Diagnosis Date Noted   Erectile dysfunction 09/13/2013   Other generalized ischemic cerebrovascular disease 07/10/2013   Other and unspecified hyperlipidemia 07/10/2013    ONSET DATE: 02/14/2024  REFERRING DIAG: R26.9 (ICD-10-CM) - Gait disorder   THERAPY DIAG:  Unsteadiness on feet  Other abnormalities of gait and mobility  Abnormal posture  Rationale for Evaluation and Treatment: Rehabilitation  SUBJECTIVE:                                                                                                                                                                                             SUBJECTIVE STATEMENT: Pt presents with rollator. States he has been working on upright posture and has noticed he is no longer squeaking w/his RLE. Is happy about this. No falls.   Pt accompanied by: self  PERTINENT HISTORY:    Per Dr. Rosemarie: remote right PICA and left PCA infarcts in 1982 as well as left basal ganglia lacunar infarct in 2005 with mild residual  right sided weakness with recent increase complaints of worsening gait difficulties likely multifactorial due to combination of old strokes, poststroke parkinsonism and mild spinal stenosis and spondylosis. He also has mild cognitive impairment which appears quite stable    Patient has noticed increasing tremors in his left hand as well as some increase hypophonia and drooling.  He does have some difficulty at times getting up from the chair or wearing his clothes.  He was seen  at [facility] neurology for consultation and started on Sinemet  25/100 mg 1-1/2 tablets 3 times daily. He continues to ambulate with a wheeled walker and feels his balance is poor. He has had a few falls in fact 9 in the last several months fortunately no major injury.   PAIN:  Are you having pain? Yes: NPRS scale: 1/10 Pain location: R hip Pain description: achy   There were no vitals filed for this visit.     PRECAUTIONS: Fall  FALLS: Has patient fallen in last 6 months? Reports 9 falls in the past year, reports most falls occur when going up on his toes   LIVING ENVIRONMENT: Lives with: lives with their spouse, daughter lives close by and son is moving closer  Lives in: House/apartment - will soon be downsizing to an apartment  Stairs: Yes: External: 4 huge stone steps steps; on right going up Has following equipment at home: Single point cane, Walker - 4 wheeled, and Grab bars  PLOF: Independent with household mobility with device, Independent with community mobility with device, Vocation/Vocational requirements: Currently still working, Theatre manager a Physiological scientist, and Leisure: collects books and baseball cards (has been collecting for 56 years)  No driving   PATIENT GOALS: I don't know, everyone just tells me I should come here   OBJECTIVE:  Note: Objective measures were completed at Evaluation unless otherwise noted.  DIAGNOSTIC FINDINGS: MRI brain 09/2021: IMPRESSION: This MRI of the brain with and  without contrast shows the following: 1.   Remote left posterior cerebral artery infarction, remote right posterior inferior cerebellar artery infarction and remote lacunar infarction in the left posterior internal capsule.   2.   The brain was otherwise normal for age. 3.   No acute findings.  Normal enhancement pattern.    COGNITION: Overall cognitive status: Within functional limits for tasks assessed   SENSATION: Pt reports some numbness/tingling in RLE, doesn't happen all the time   COORDINATION: Heel to shin: WNL  OBSERVATIONS: Resting LUE tremor  Bilateral ankle swelling (as appt with PCP next month and going to discuss)   POSTURE: rounded shoulders, forward head, and weight shift left, head and trunk tilted to the L    LOWER EXTREMITY MMT:    MMT Right Eval Left Eval  Hip flexion 5 5  Hip extension    Hip abduction    Hip adduction    Hip internal rotation    Hip external rotation    Knee flexion 5 5  Knee extension 5 4+  Ankle dorsiflexion 5 5  Ankle plantarflexion    Ankle inversion    Ankle eversion    (Blank rows = not tested)  BED MOBILITY:   Pt reports no difficulties   TRANSFERS: Sit to stand: SBA  Assistive device utilized: None     Stand to sit: SBA  Assistive device utilized: None      Initial cues for pt to scoot out to the edge before standing with no UE support    FUNCTIONAL TESTS:  5 times sit to stand: 12.7 seconds with no UE support, wide BOS, decr forward lean with incr weight shifted onto heels  10 meter walk test: 11.3 seconds with SPC = 2.90 ft/sec   VITALS  There were no vitals filed for this visit.  TREATMENT:  Ther Act/NMR  SciFit multi-peaks level 10.0 for 8 minutes using BUE/BLEs for neural priming for reciprocal movement, dynamic cardiovascular warmup and increased amplitude of stepping. Cued to  maintain steps/min >75 throughout, which pt performed well. Noted L truncal lean during activity. RPE of 4/10 following activity    [facility] PT Assessment - 03/22/24 0001       Balance   Balance Assessed Yes      Standardized Balance Assessment   Standardized Balance Assessment Mini-BESTest      Mini-BESTest   Compensatory Stepping Correction - Forward No step, OR would fall if not caught, OR falls spontaneously.   mutliple shuffled steps   Compensatory Stepping Correction - Backward Moderate: More than one step is required to recover equilibrium   3 steps   Compensatory Stepping Correction - Left Lateral Normal: Recovers independently with 1 step (crossover or lateral OK)   crossover step w/RLE   Compensatory Stepping Correction - Right Lateral Moderate: Several steps to recover equilibrium   3 small steps   Stepping Corredtion Lateral - lowest score 1         Pt reported pain in R hip w/R push and release test, so added standing TFL stretch to HEP (see bolded below), which pt felt a good stretch with. Informed pt to perform beside a wall on R side and a chair or his locked rollator on L side for BUE support.  In // bars for improved lateral weight shifting, postural control, step clearance, turns and stepping strategy: Alt quarter turns w/step over 6 hurdle, x10 reps per side w/BUE support progressing to no UE support. Increased difficulty stepping w/RLE and occasional posterior LOB noted, but pt able to correct independently.  On rockerboard in A/P direction using mirror for visual biofeedback on posture:  Standing w/no UE support and EO, x4 minutes. Pt w/significant lateral lean to L side. Pt initially unable to stand w/neutral cervical spine without posterior LOB, but quickly adjusted if moving slowly  Progressed to alt fwd step off board, x8 reps per side w/UE support and CGA-min A. Pt very challenged by this, especially w/LLE. Provided tactile cues to facilitate weight shift to R side  throughout, which did improve step length/clearance w/LLE. Min cues to avoid narrow BOS and to ensure feet were securely on board prior to stepping, as pt tends to place RLE on edge of board Alt retro step off board w/light/no UE support, x8 reps per side. Pt able to perform well in retro direction w/S* only.    Gait pattern: step through pattern, decreased step length- Right, decreased stride length, decreased hip/knee flexion- Right, decreased ankle dorsiflexion- Right, festinating, lateral hip instability, lateral lean- Left, trunk flexed, and poor foot clearance- Right Distance walked: Various clinic distances and through doorways  Assistive device utilized: Walker - 4 wheeled and None Level of assistance: SBA and CGA Comments: Pt w/improved postural control and step clearance without AD this date. Cued pt to maintain upright posture w/rollator as well to improve step clearance w/RLE.     PATIENT EDUCATION: Education details: Initial HEP Person educated: Patient Education method: Programmer, multimedia, Facilities manager, Verbal cues, and Handouts Education comprehension: verbalized understanding, returned demonstration, verbal cues required, and needs further education  HOME EXERCISE PROGRAM: Access Code: [email or web address] URL: [email or web address] Date: 03/22/2024 Prepared by: Marlon Mckennah Kretchmer  Exercises - Standing ITB Stretch  - 1 x daily - 7 x weekly - 3 sets - 30-60 seconds hold  GOALS: Goals reviewed with patient? Yes  SHORT TERM  GOALS: Target date: 04/03/2024  Pt will be independent with initial HEP for strength, gait, balance in order to build upon functional gains made in therapy. Baseline: Goal status: INITIAL  2.  FOG-Q to be assessed with LTG written. Baseline:  Goal status: MET  3.  TUG/cog TUG to be assessed with LTG written.  Baseline:  Goal status: MET  4.  Pt will be able to demo/verbalize understanding of freezing strategies.  Baseline:  Goal status:  INITIAL  5.  Push and release to be assessed with LTG written.  Baseline: assessed on 7/17 Goal status: MET   LONG TERM GOALS: Target date: 05/01/2024  Pt will be independent with final land and aquatic HEP for strength, gait, balance in order to build upon functional gains made in therapy. Baseline:  Goal status: INITIAL  2.  Pt will correct forward balance on push and release test in 3 or fewer steps for improved reactive balance strategies and fall prevention  Baseline: would fall if not caught (7/17) Goal status: INITIAL  3.  Pt will improve TUG to less than or equal to 11 seconds w/LRAD for improved functional mobility and decreased fall risk.  Baseline: 13.53s no AD (7/10) Goal status: REVISED  4.  Pt will score </= 12/24 on FOG-Q for improved implementation of freezing strategies and reduced fall risk  Baseline: 14/24 Goal status: INITIAL  5.  Pt will verbalize understanding of local Parkinson's disease resources, including options for continue community fitness.  Baseline:  Goal status: INITIAL  6.  Pt will improve gait speed with LRAD to at least 3.3 ft/sec in order to demo improved community mobility.   Baseline: 11.3 seconds with SPC = 2.90 ft/sec Goal status: INITIAL  ASSESSMENT:  CLINICAL IMPRESSION: Emphasis of skilled PT session on assessing stepping strategies, postural control and lateral weight shifting. Pt most challenged w/stepping strategy in anterior direction, unable to catch himself due to freezing and festination. Pt demonstrated improved postural control this date with reduced squeaking of RLE both with and without AD. Pt reports he has increased his rowing to 5 days per week and is working on free weights at the gym. Added TFL stretch to HEP as pt continues to report R hip pain and ITB syndrome.  Continue POC.    OBJECTIVE IMPAIRMENTS: Abnormal gait, decreased activity tolerance, decreased balance, decreased coordination, decreased knowledge of  condition, decreased knowledge of use of DME, decreased mobility, difficulty walking, decreased strength, decreased safety awareness, impaired flexibility, impaired sensation, impaired UE functional use, and postural dysfunction.   ACTIVITY LIMITATIONS: stairs, transfers, and locomotion level  PARTICIPATION LIMITATIONS: driving, shopping, and community activity  PERSONAL FACTORS: Age, Behavior pattern, Past/current experiences, Time since onset of injury/illness/exacerbation, and 3+ comorbidities:  Vascular Parkinsonism/Idiopathic PD, mild cognitive impairment, mild spinal stenosis and spondylosis, remote right PICA and left PCA infarcts in 1982 as well as left basal ganglia lacunar infarct in 2005 with mild residual right sided weakness  are also affecting patient's functional outcome.   REHAB POTENTIAL: Good  CLINICAL DECISION MAKING: Evolving/moderate complexity  EVALUATION COMPLEXITY: Moderate  PLAN:  PT FREQUENCY: 2x/week  PT DURATION: 8 weeks  PLANNED INTERVENTIONS: 97164- PT Re-evaluation, 97110-Therapeutic exercises, 97530- Therapeutic activity, 97112- Neuromuscular re-education, 97535- Self Care, [id number]- Manual therapy, [phone]- Gait training, Patient/Family education, Balance training, Stair training, Vestibular training, and DME instructions  PLAN FOR NEXT SESSION:  Initial HEP for standing weight shifting, foot clearance tasks, standing PWR moves. Work with use of rollator (pt has one at home). Continue  to educate on freezing strategies. Postural control, lateral weight shifting, forward stepping    Harrington Jobe E Banessa Mao, PT, DPT 03/22/2024, 4:25 PM

## 2024-03-26 ENCOUNTER — Ambulatory Visit: Admitting: Physical Therapy

## 2024-03-26 ENCOUNTER — Encounter: Payer: Self-pay | Admitting: Physical Therapy

## 2024-03-26 VITALS — BP 70/44 | HR 79

## 2024-03-26 DIAGNOSIS — R2689 Other abnormalities of gait and mobility: Secondary | ICD-10-CM | POA: Diagnosis not present

## 2024-03-26 DIAGNOSIS — Z9181 History of falling: Secondary | ICD-10-CM | POA: Diagnosis not present

## 2024-03-26 DIAGNOSIS — R2681 Unsteadiness on feet: Secondary | ICD-10-CM | POA: Diagnosis not present

## 2024-03-26 DIAGNOSIS — R29818 Other symptoms and signs involving the nervous system: Secondary | ICD-10-CM | POA: Diagnosis not present

## 2024-03-26 DIAGNOSIS — R293 Abnormal posture: Secondary | ICD-10-CM | POA: Diagnosis not present

## 2024-03-26 DIAGNOSIS — R471 Dysarthria and anarthria: Secondary | ICD-10-CM | POA: Diagnosis not present

## 2024-03-26 NOTE — Therapy (Signed)
 OUTPATIENT PHYSICAL THERAPY NEURO TREATMENT   Patient Name: Timothy Shields MRN: [id number] DOB:July 26, 1946, 78 y.o., male Today's Date: 03/26/2024   PCP: Janey Santos, MD  REFERRING PROVIDER: Rosemarie Eather RAMAN, MD   END OF SESSION:  PT End of Session - 03/26/24 1448     Visit Number 6    Number of Visits 17    Date for PT Re-Evaluation 05/05/24    Authorization Type MEDICARE PART A AND B    PT Start Time 1446    PT Stop Time 1528    PT Time Calculation (min) 42 min    Equipment Utilized During Treatment Gait belt    Activity Tolerance Patient tolerated treatment well    Behavior During Therapy WFL for tasks assessed/performed            Past Medical History:  Diagnosis Date   Cerebrovascular disease    Headache(784.0)    Stroke (HCC)    3   Past Surgical History:  Procedure Laterality Date   HERNIA REPAIR     HERNIA REPAIR     Twice in the 1970's   Patient Active Problem List   Diagnosis Date Noted   Erectile dysfunction 09/13/2013   Other generalized ischemic cerebrovascular disease 07/10/2013   Other and unspecified hyperlipidemia 07/10/2013    ONSET DATE: 02/14/2024  REFERRING DIAG: R26.9 (ICD-10-CM) - Gait disorder   THERAPY DIAG:  Unsteadiness on feet  Other abnormalities of gait and mobility  Abnormal posture  History of falling  Rationale for Evaluation and Treatment: Rehabilitation  SUBJECTIVE:                                                                                                                                                                                             SUBJECTIVE STATEMENT: Got here early and walked to [employer] with his walker and got a peach milkshake. On a mission to find the best milkshake in [place]. Notices that his walking is doing better.  Pt accompanied by: self  PERTINENT HISTORY:    Per Dr. Rosemarie: remote right PICA and left PCA infarcts in 1982 as well as left basal ganglia lacunar infarct in  2005 with mild residual right sided weakness with recent increase complaints of worsening gait difficulties likely multifactorial due to combination of old strokes, poststroke parkinsonism and mild spinal stenosis and spondylosis. He also has mild cognitive impairment which appears quite stable    Patient has noticed increasing tremors in his left hand as well as some increase hypophonia and drooling.  He does have some difficulty at times getting up from the chair or wearing his clothes.  He was seen at [facility] neurology for consultation and started on Sinemet  25/100 mg 1-1/2 tablets 3 times daily. He continues to ambulate with a wheeled walker and feels his balance is poor. He has had a few falls in fact 9 in the last several months fortunately no major injury.   PAIN:  Are you having pain? Yes: NPRS scale: right now it is 0/10 and earlier was 4/10 Pain location: R hip Pain description: achy   Vitals:   03/26/24 1457 03/26/24 1459  BP: (!) 81/50 (!) 70/44  Pulse: 74 79    PRECAUTIONS: Fall  FALLS: Has patient fallen in last 6 months? Reports 9 falls in the past year, reports most falls occur when going up on his toes   LIVING ENVIRONMENT: Lives with: lives with their spouse, daughter lives close by and son is moving closer  Lives in: House/apartment - will soon be downsizing to an apartment  Stairs: Yes: External: 4 huge stone steps steps; on right going up Has following equipment at home: Single point cane, Walker - 4 wheeled, and Grab bars  PLOF: Independent with household mobility with device, Independent with community mobility with device, Vocation/Vocational requirements: Currently still working, Theatre manager a Physiological scientist, and Leisure: collects books and baseball cards (has been collecting for 56 years)  No driving   PATIENT GOALS: I don't know, everyone just tells me I should come here   OBJECTIVE:  Note: Objective measures were completed at Evaluation unless  otherwise noted.  DIAGNOSTIC FINDINGS: MRI brain 09/2021: IMPRESSION: This MRI of the brain with and without contrast shows the following: 1.   Remote left posterior cerebral artery infarction, remote right posterior inferior cerebellar artery infarction and remote lacunar infarction in the left posterior internal capsule.   2.   The brain was otherwise normal for age. 3.   No acute findings.  Normal enhancement pattern.    COGNITION: Overall cognitive status: Within functional limits for tasks assessed   SENSATION: Pt reports some numbness/tingling in RLE, doesn't happen all the time   COORDINATION: Heel to shin: WNL  OBSERVATIONS: Resting LUE tremor  Bilateral ankle swelling (as appt with PCP next month and going to discuss)   POSTURE: rounded shoulders, forward head, and weight shift left, head and trunk tilted to the L    LOWER EXTREMITY MMT:    MMT Right Eval Left Eval  Hip flexion 5 5  Hip extension    Hip abduction    Hip adduction    Hip internal rotation    Hip external rotation    Knee flexion 5 5  Knee extension 5 4+  Ankle dorsiflexion 5 5  Ankle plantarflexion    Ankle inversion    Ankle eversion    (Blank rows = not tested)  BED MOBILITY:   Pt reports no difficulties   TRANSFERS: Sit to stand: SBA  Assistive device utilized: None     Stand to sit: SBA  Assistive device utilized: None      Initial cues for pt to scoot out to the edge before standing with no UE support    FUNCTIONAL TESTS:  5 times sit to stand: 12.7 seconds with no UE support, wide BOS, decr forward lean with incr weight shifted onto heels  10 meter walk test: 11.3 seconds with SPC = 2.90 ft/sec   VITALS  Vitals:   03/26/24 1457 03/26/24 1459  BP: (!) 81/50 (!) 70/44  Pulse: 74 79  TREATMENT:  Therapeutic Activity: Vitals:   03/26/24 1457  03/26/24 1459  BP: (!) 81/50 (!) 70/44  Pulse: 74 79  Sitting, Standing  Pt reporting feeling asymptomatic and not having any dizziness or lightheadedness. Pt reporting his BP is never this low. Pt reporting he didn't eat lunch, except for a milkshake. Did walk to [employer] with his rollator and reports that he didn't drink a lot of water today either. Offered pt some peanut butter crackers, but pt declined.   PT assessed manually with BP at 90/58  PT assessed manually again with BP at 92/62 at end of session, pt asymptomatic   At end of session educated pt to go home (was getting an Gisele), drink plenty of water, eat food, and monitor his BP. Pt verbalized understanding and was still asymptomatic when ambulating out of the clinic with his rollator   NMR:   Pt performs PWR! Moves in seated position - 2 sets of 10 reps    PWR! Up for improved posture  PWR! Rock for improved weight shifting - cues to look at hands, pt reporting this felt like a good stretch   PWR! Twist for improved trunk rotation   PWR! Step for improved step initiation - performed 2nd set of 10 on green balance disc for improved core stability, needed cues for incr foot clearance, pt performing slowly   Cues provided for technique, larger amplitude movement patterns. Educated on purpose of PWR moves exercise on relation to function and PD deficits Pt reporting RPE as 5-6/10. Provided handout for HEP    Gait pattern: step through pattern, decreased step length- Right, decreased stride length, decreased hip/knee flexion- Right, decreased ankle dorsiflexion- Right, festinating, lateral hip instability, lateral lean- Left, trunk flexed, and poor foot clearance- Right Distance walked: Various clinic distances and through doorways  Assistive device utilized: Walker - 4 wheeled and None Level of assistance: SBA and CGA Comments: Pt w/improved postural control and step clearance without AD this date. Cued pt to maintain upright  posture w/rollator as well to improve step clearance w/RLE.     PATIENT EDUCATION: Education details: See therapeutic activity section, seated PWR moves to HEP  Person educated: Patient Education method: Explanation, Demonstration, Verbal cues, and Handouts Education comprehension: verbalized understanding, returned demonstration, verbal cues required, and needs further education  HOME EXERCISE PROGRAM: Seated PWR moves  Access Code: [email or web address] URL: [email or web address] Date: 03/22/2024 Prepared by: Marlon Plaster  Exercises - Standing ITB Stretch  - 1 x daily - 7 x weekly - 3 sets - 30-60 seconds hold  GOALS: Goals reviewed with patient? Yes  SHORT TERM GOALS: Target date: 04/03/2024  Pt will be independent with initial HEP for strength, gait, balance in order to build upon functional gains made in therapy. Baseline: Goal status: INITIAL  2.  FOG-Q to be assessed with LTG written. Baseline:  Goal status: MET  3.  TUG/cog TUG to be assessed with LTG written.  Baseline:  Goal status: MET  4.  Pt will be able to demo/verbalize understanding of freezing strategies.  Baseline:  Goal status: INITIAL  5.  Push and release to be assessed with LTG written.  Baseline: assessed on 7/17 Goal status: MET   LONG TERM GOALS: Target date: 05/01/2024  Pt will be independent with final land and aquatic HEP for strength, gait, balance in order to build upon functional gains made in therapy. Baseline:  Goal status: INITIAL  2.  Pt will correct forward balance on push and release test  in 3 or fewer steps for improved reactive balance strategies and fall prevention  Baseline: would fall if not caught (7/17) Goal status: INITIAL  3.  Pt will improve TUG to less than or equal to 11 seconds w/LRAD for improved functional mobility and decreased fall risk.  Baseline: 13.53s no AD (7/10) Goal status: REVISED  4.  Pt will score </= 12/24 on FOG-Q for improved  implementation of freezing strategies and reduced fall risk  Baseline: 14/24 Goal status: INITIAL  5.  Pt will verbalize understanding of local Parkinson's disease resources, including options for continue community fitness.  Baseline:  Goal status: INITIAL  6.  Pt will improve gait speed with LRAD to at least 3.3 ft/sec in order to demo improved community mobility.   Baseline: 11.3 seconds with SPC = 2.90 ft/sec Goal status: INITIAL  ASSESSMENT:  CLINICAL IMPRESSION: Assessed BP at start of session with BP running very low today (see above). Pt reporting in sitting/standing that he is asymptomatic with no lightheadedness or dizziness. Pt did report he did not drink a lot of water today and ambulated out in the heat to [employer] to get a milkshake right before therapy session. Monitored BP and performed entirety of session in seated due to BP. Worked on [employer] moves in seated and provided as HEP to work on mobility and larger amplitude movement patterns. Pt still asymptomatic at end of session when ambulating out of the gym with pt getting an uber ride home. Educated to make sure to go home and drink water, eat food with sustenance, and monitor BP. Pt verbalized understanding. Will continue POC.     OBJECTIVE IMPAIRMENTS: Abnormal gait, decreased activity tolerance, decreased balance, decreased coordination, decreased knowledge of condition, decreased knowledge of use of DME, decreased mobility, difficulty walking, decreased strength, decreased safety awareness, impaired flexibility, impaired sensation, impaired UE functional use, and postural dysfunction.   ACTIVITY LIMITATIONS: stairs, transfers, and locomotion level  PARTICIPATION LIMITATIONS: driving, shopping, and community activity  PERSONAL FACTORS: Age, Behavior pattern, Past/current experiences, Time since onset of injury/illness/exacerbation, and 3+ comorbidities:  Vascular Parkinsonism/Idiopathic PD, mild cognitive impairment, mild  spinal stenosis and spondylosis, remote right PICA and left PCA infarcts in 1982 as well as left basal ganglia lacunar infarct in 2005 with mild residual right sided weakness  are also affecting patient's functional outcome.   REHAB POTENTIAL: Good  CLINICAL DECISION MAKING: Evolving/moderate complexity  EVALUATION COMPLEXITY: Moderate  PLAN:  PT FREQUENCY: 2x/week  PT DURATION: 8 weeks  PLANNED INTERVENTIONS: 97164- PT Re-evaluation, 97110-Therapeutic exercises, 97530- Therapeutic activity, 97112- Neuromuscular re-education, 97535- Self Care, [id number]- Manual therapy, [phone]- Gait training, Patient/Family education, Balance training, Stair training, Vestibular training, and DME instructions  PLAN FOR NEXT SESSION: check BP!! Was very low on 7/21 but pt asymptomatic Add standing PWR moves to HEP when able. Work with use of rollator (pt has one at home). Continue to educate on freezing strategies. Postural control, lateral weight shifting, forward stepping    [place] LOISE Senate, PT, DPT 03/26/2024, 5:12 PM

## 2024-03-29 ENCOUNTER — Ambulatory Visit: Admitting: Speech Pathology

## 2024-03-29 ENCOUNTER — Ambulatory Visit: Admitting: Physical Therapy

## 2024-03-29 VITALS — BP 113/64 | HR 61

## 2024-03-29 DIAGNOSIS — R293 Abnormal posture: Secondary | ICD-10-CM

## 2024-03-29 DIAGNOSIS — R471 Dysarthria and anarthria: Secondary | ICD-10-CM

## 2024-03-29 DIAGNOSIS — Z9181 History of falling: Secondary | ICD-10-CM | POA: Diagnosis not present

## 2024-03-29 DIAGNOSIS — R2681 Unsteadiness on feet: Secondary | ICD-10-CM

## 2024-03-29 DIAGNOSIS — R29818 Other symptoms and signs involving the nervous system: Secondary | ICD-10-CM | POA: Diagnosis not present

## 2024-03-29 DIAGNOSIS — R2689 Other abnormalities of gait and mobility: Secondary | ICD-10-CM | POA: Diagnosis not present

## 2024-03-29 NOTE — Therapy (Addendum)
 OUTPATIENT PHYSICAL THERAPY NEURO TREATMENT   Patient Name: Timothy Shields MRN: [id number] DOB:07/14/1946, 78 y.o., male Today's Date: 03/29/2024   PCP: Janey Santos, MD  REFERRING PROVIDER: Rosemarie Eather RAMAN, MD   END OF SESSION:  PT End of Session - 03/29/24 1542     Visit Number 7    Number of Visits 17    Date for PT Re-Evaluation 05/05/24    Authorization Type MEDICARE PART A AND B    PT Start Time 1537   Handoff w/SLP   PT Stop Time 1610   Pt's transportation here early   PT Time Calculation (min) 33 min    Equipment Utilized During Treatment Gait belt    Activity Tolerance Patient tolerated treatment well    Behavior During Therapy WFL for tasks assessed/performed             Past Medical History:  Diagnosis Date   Cerebrovascular disease    Headache(784.0)    Stroke (HCC)    3   Past Surgical History:  Procedure Laterality Date   HERNIA REPAIR     HERNIA REPAIR     Twice in the 1970's   Patient Active Problem List   Diagnosis Date Noted   Erectile dysfunction 09/13/2013   Other generalized ischemic cerebrovascular disease 07/10/2013   Other and unspecified hyperlipidemia 07/10/2013    ONSET DATE: 02/14/2024  REFERRING DIAG: R26.9 (ICD-10-CM) - Gait disorder   THERAPY DIAG:  Unsteadiness on feet  Other abnormalities of gait and mobility  Abnormal posture  Rationale for Evaluation and Treatment: Rehabilitation  SUBJECTIVE:                                                                                                                                                                                             SUBJECTIVE STATEMENT: Pt presents w/rollator, session ran late w/SLP. Pt reports doing well, no falls or changes since last session. Feels like he is walking better.   Pt accompanied by: self  PERTINENT HISTORY:    Per Dr. Rosemarie: remote right PICA and left PCA infarcts in 1982 as well as left basal ganglia lacunar infarct in 2005  with mild residual right sided weakness with recent increase complaints of worsening gait difficulties likely multifactorial due to combination of old strokes, poststroke parkinsonism and mild spinal stenosis and spondylosis. He also has mild cognitive impairment which appears quite stable    Patient has noticed increasing tremors in his left hand as well as some increase hypophonia and drooling.  He does have some difficulty at times getting up from the chair or wearing his clothes.  He  was seen at [facility] neurology for consultation and started on Sinemet  25/100 mg 1-1/2 tablets 3 times daily. He continues to ambulate with a wheeled walker and feels his balance is poor. He has had a few falls in fact 9 in the last several months fortunately no major injury.   PAIN:  Are you having pain? Yes: NPRS scale: right now it is 0/10 and earlier was 4/10 Pain location: R hip Pain description: achy    PRECAUTIONS: Fall  FALLS: Has patient fallen in last 6 months? Reports 9 falls in the past year, reports most falls occur when going up on his toes   LIVING ENVIRONMENT: Lives with: lives with their spouse, daughter lives close by and son is moving closer  Lives in: House/apartment - will soon be downsizing to an apartment  Stairs: Yes: External: 4 huge stone steps steps; on right going up Has following equipment at home: Single point cane, Walker - 4 wheeled, and Grab bars  PLOF: Independent with household mobility with device, Independent with community mobility with device, Vocation/Vocational requirements: Currently still working, Theatre manager a Physiological scientist, and Leisure: collects books and baseball cards (has been collecting for 56 years)  No driving   PATIENT GOALS: I don't know, everyone just tells me I should come here   OBJECTIVE:  Note: Objective measures were completed at Evaluation unless otherwise noted.  DIAGNOSTIC FINDINGS: MRI brain 09/2021: IMPRESSION: This MRI of the brain  with and without contrast shows the following: 1.   Remote left posterior cerebral artery infarction, remote right posterior inferior cerebellar artery infarction and remote lacunar infarction in the left posterior internal capsule.   2.   The brain was otherwise normal for age. 3.   No acute findings.  Normal enhancement pattern.    COGNITION: Overall cognitive status: Within functional limits for tasks assessed   SENSATION: Pt reports some numbness/tingling in RLE, doesn't happen all the time   COORDINATION: Heel to shin: WNL  OBSERVATIONS: Resting LUE tremor  Bilateral ankle swelling (as appt with PCP next month and going to discuss)   POSTURE: rounded shoulders, forward head, and weight shift left, head and trunk tilted to the L    LOWER EXTREMITY MMT:    MMT Right Eval Left Eval  Hip flexion 5 5  Hip extension    Hip abduction    Hip adduction    Hip internal rotation    Hip external rotation    Knee flexion 5 5  Knee extension 5 4+  Ankle dorsiflexion 5 5  Ankle plantarflexion    Ankle inversion    Ankle eversion    (Blank rows = not tested)  BED MOBILITY:   Pt reports no difficulties   TRANSFERS: Sit to stand: SBA  Assistive device utilized: None     Stand to sit: SBA  Assistive device utilized: None      Initial cues for pt to scoot out to the edge before standing with no UE support    FUNCTIONAL TESTS:  5 times sit to stand: 12.7 seconds with no UE support, wide BOS, decr forward lean with incr weight shifted onto heels  10 meter walk test: 11.3 seconds with SPC = 2.90 ft/sec   VITALS  Vitals:   03/29/24 1542  BP: 113/64  Pulse: 61  TREATMENT:  Self-care Assessed vitals (see above) and WNL today  Provided pt w/aquatic therapy handout and addressed all questions to prepare for aquatic therapy session on 8/11.     NMR:  In // bars for improved postural control and BLE strength:  On rocker board in A/P direction:  Mini squat w/shoulder abduction at top of rep, x15 reps using mirror for visual biofeedback and CGA-min A for posterior LOB recovery. Pt performed well and had single posterior LOB requiring min A to correct. Min cues for large hands w/movement.  At ballet bar w/pods on mirror, 5 Blaze pods on random setting for improved postural control, truncal mobility, UE coordination and reaching out of BOS.  Performed on 2 minute intervals with 2 minute rest periods.  Pt requires min A guarding due to anterior LOB. Round 1:  pods placed on mirror from 6-3 o'clock w/pt standing on rockerboard in A/P direction.  35 hits. Round 2:  Pods placed in horizontal line on mirror while pt standing on blue balance beam side stepping to each pod.  32 hits. Notable errors/deficits:  Increased difficulty stepping to L side, frequent anterior LOB that pt unable to correct without UE support or therapist assistance.  Pt reports his [employer] driver arrived early and he needs to leave early, so escorted pt out of clinic w/rollator at mod I level.     Gait pattern: step through pattern, decreased step length- Right, decreased stride length, decreased hip/knee flexion- Right, decreased ankle dorsiflexion- Right, festinating, lateral hip instability, lateral lean- Left, trunk flexed, and poor foot clearance- Right Distance walked: Various clinic distances and through doorways  Assistive device utilized: Walker - 4 wheeled and None Level of assistance: Modified independence and SBA Comments: Pt w/improved postural control and step clearance without AD this date. Cued pt to maintain upright posture w/rollator as well to improve step clearance w/RLE.     PATIENT EDUCATION: Education details: Aquatic therapy info, updated appointment calendar for September, continue HEP  Person educated: Patient Education method: Explanation,  Demonstration, Verbal cues, and Handouts Education comprehension: verbalized understanding, returned demonstration, verbal cues required, and needs further education  HOME EXERCISE PROGRAM: Seated PWR moves  Access Code: [email or web address] URL: [email or web address] Date: 03/22/2024 Prepared by: Marlon Lux Meaders  Exercises - Standing ITB Stretch  - 1 x daily - 7 x weekly - 3 sets - 30-60 seconds hold  GOALS: Goals reviewed with patient? Yes  SHORT TERM GOALS: Target date: 04/03/2024  Pt will be independent with initial HEP for strength, gait, balance in order to build upon functional gains made in therapy. Baseline: Goal status: INITIAL  2.  FOG-Q to be assessed with LTG written. Baseline:  Goal status: MET  3.  TUG/cog TUG to be assessed with LTG written.  Baseline:  Goal status: MET  4.  Pt will be able to demo/verbalize understanding of freezing strategies.  Baseline:  Goal status: INITIAL  5.  Push and release to be assessed with LTG written.  Baseline: assessed on 7/17 Goal status: MET   LONG TERM GOALS: Target date: 05/01/2024  Pt will be independent with final land and aquatic HEP for strength, gait, balance in order to build upon functional gains made in therapy. Baseline:  Goal status: INITIAL  2.  Pt will correct forward balance on push and release test in 3 or fewer steps for improved reactive balance strategies and fall prevention  Baseline: would fall if not caught (7/17) Goal status: INITIAL  3.  Pt will improve TUG  to less than or equal to 11 seconds w/LRAD for improved functional mobility and decreased fall risk.  Baseline: 13.53s no AD (7/10) Goal status: REVISED  4.  Pt will score </= 12/24 on FOG-Q for improved implementation of freezing strategies and reduced fall risk  Baseline: 14/24 Goal status: INITIAL  5.  Pt will verbalize understanding of local Parkinson's disease resources, including options for continue community fitness.   Baseline:  Goal status: INITIAL  6.  Pt will improve gait speed with LRAD to at least 3.3 ft/sec in order to demo improved community mobility.   Baseline: 11.3 seconds with SPC = 2.90 ft/sec Goal status: INITIAL  ASSESSMENT:  CLINICAL IMPRESSION: Session limited as pt ran late w/SLP and needed to leave early due to transportation. Emphasis of skilled PT session on monitoring BP, postural control and stability w/reaching out of BOS. Pt's BP WNL this date. Pt most challenged w/reaching OH on rockerboard due to anterior LOB that he is unable to correct w/hip or ankle strategy. Pt reports he feels he is moving and walking better and would like to continue w/PT as it has been beneficial. Will continue POC.     OBJECTIVE IMPAIRMENTS: Abnormal gait, decreased activity tolerance, decreased balance, decreased coordination, decreased knowledge of condition, decreased knowledge of use of DME, decreased mobility, difficulty walking, decreased strength, decreased safety awareness, impaired flexibility, impaired sensation, impaired UE functional use, and postural dysfunction.   ACTIVITY LIMITATIONS: stairs, transfers, and locomotion level  PARTICIPATION LIMITATIONS: driving, shopping, and community activity  PERSONAL FACTORS: Age, Behavior pattern, Past/current experiences, Time since onset of injury/illness/exacerbation, and 3+ comorbidities:  Vascular Parkinsonism/Idiopathic PD, mild cognitive impairment, mild spinal stenosis and spondylosis, remote right PICA and left PCA infarcts in 1982 as well as left basal ganglia lacunar infarct in 2005 with mild residual right sided weakness  are also affecting patient's functional outcome.   REHAB POTENTIAL: Good  CLINICAL DECISION MAKING: Evolving/moderate complexity  EVALUATION COMPLEXITY: Moderate  PLAN:  PT FREQUENCY: 2x/week  PT DURATION: 8 weeks  PLANNED INTERVENTIONS: 97164- PT Re-evaluation, 97110-Therapeutic exercises, 97530- Therapeutic  activity, 97112- Neuromuscular re-education, 97535- Self Care, [id number]- Manual therapy, [phone]- Gait training, Patient/Family education, Balance training, Stair training, Vestibular training, and DME instructions  PLAN FOR NEXT SESSION: check BP!! Was very low on 7/21 but pt asymptomatic  STG! And make sure he understands where to go for aquatic session on 8/11.   Add standing PWR moves to HEP when able. Work with use of rollator (pt has one at home). Continue to educate on freezing strategies. Postural control, lateral weight shifting, forward stepping    Roux Brandy E Claudette Wermuth, PT, DPT 03/29/2024, 4:17 PM

## 2024-03-29 NOTE — Therapy (Signed)
 OUTPATIENT SPEECH LANGUAGE PATHOLOGY PARKINSON'S EVALUATION   Patient Name: Timothy Shields MRN: [id number] DOB:20-May-1946, 78 y.o., male Today's Date: 03/29/2024  PCP: Timothy Santos, MD REFERRING PROVIDER: Morita, Hokuto, MD  END OF SESSION:  End of Session - 03/29/24 1539     Visit Number 1    Number of Visits 25    Date for SLP Re-Evaluation 06/21/24    SLP Start Time 1445    SLP Stop Time  1530    SLP Time Calculation (min) 45 min    Activity Tolerance Patient tolerated treatment well          Past Medical History:  Diagnosis Date   Cerebrovascular disease    Headache(784.0)    Stroke (HCC)    3   Past Surgical History:  Procedure Laterality Date   HERNIA REPAIR     HERNIA REPAIR     Twice in the 1970's   Patient Active Problem List   Diagnosis Date Noted   Erectile dysfunction 09/13/2013   Other generalized ischemic cerebrovascular disease 07/10/2013   Other and unspecified hyperlipidemia 07/10/2013    ONSET DATE: 03/01/2024 (referral) PD sx 2-3 years ago  REFERRING DIAG:  Diagnosis  G20.A1 (ICD-10-CM) - Parkinson's disease without dyskinesia, without mention of fluctuations    THERAPY DIAG:  Dysarthria and anarthria  Rationale for Evaluation and Treatment: Rehabilitation  SUBJECTIVE:   SUBJECTIVE STATEMENT: Other people clearly can't hear me Pt accompanied by: self  PERTINENT HISTORY: Saw a PD specialist at [facility] and reports he has a mix of idopathic PD and vascular PD (unable to read entirety of this neurologist's note due to them being at [facility]). Prescribed him carbidopa  levodopa  3 and 1/2 tabs for 3 times a day. Symptoms started 2-3 years ago. Started to get the tremors last year. Reports that after starting Carbidopa  he has been able to turn easier in the bed and has been sleeping easier.  Per Dr. Rosemarie: remote right PICA and left PCA infarcts in 1982 as well as left basal ganglia lacunar infarct in 2005 with mild residual right sided  weakness with recent increase complaints of worsening gait difficulties likely multifactorial due to combination of old strokes, poststroke parkinsonism and mild spinal stenosis and spondylosis. He also has mild cognitive impairment which appears quite stable   PAIN:  Are you having pain? No  FALLS: Has patient fallen in last 6 months?  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   COGNITION: Overall cognitive status: Within functional limits for tasks assessed and h/o MCI per Dr. Rosemarie Areas of impairment: Attention and Memory Comments:   MOTOR SPEECH: Overall motor speech: impaired Level of impairment: Word Respiration: thoracic breathing Phonation: breathy, hoarse, and low vocal intensity Resonance: WFL Articulation: Appears intact Intelligibility: Intelligibility reduced Motor planning: Appears intact Motor speech errors: aware Interfering components: n/a Effective technique: increased vocal intensity  ORAL MOTOR EXAMINATION: Overall status: WFL Comments:    OBJECTIVE VOICE ASSESSMENT: Sustained ah maximum phonation time: 15.92 seconds Sustained ah loudness average: 68 dB Oral reading (passage) loudness average: 67 dB Oral reading loudness range: 65-72 dB Conversational loudness average: 63 dB Conversational loudness range: 60- 65dB Voice quality: hoarse, breathy, and low  vocal intensity Stimulability trials: Given SLP modeling and occasional min cues, loudness average increased to 85dB at loud ahand 78dB at sentence level.  Comments:    Completed audio recording of patients baseline voice  without cueing from SLP: Yes  Pt does not report difficulty with swallowing which does not warrant further evaluation.  PATIENT REPORTED OUTCOME MEASURES (PROM): Complete next session                                                                                                                            TREATMENT DATE:   PROM - ? [facility]  03/29/24: Evaluation completed. Introduced [facility]s to [facility] ([employer]) website. Demonstrated navigation to What is Parkinson video and on line practice sessions. Initiated training in Speak Out! Lesson 1 - after initial modeling and verbal instructions, Timothy Shields achieved average of 85dB on loud Ah 5/5x, 81dB average on counting and 78dB average on reading 20/20 sentences. He required frequent mod A to carryover speaking with intent in conversation today. Requested his spouse attend ST sessions. Requested access to [employer] on [employer] website   PATIENT EDUCATION: Education details: See Treatment, See Patient Instructions, HEP, compensations for dysarthria Person educated: Patient Education method: Explanation, Demonstration, Verbal cues, and Handouts Education comprehension: returned demonstration, verbal cues required, and needs further education  HOME EXERCISE PROGRAM: SPEAK OUT!   GOALS: Goals reviewed with patient? Yes  SHORT TERM GOALS: Target date: 05/03/24   Pt will complete HEP daily with occasional min A Baseline: Goal status: INITIAL  2.  Pt will average 72dB and clear phonation 18/20 sentences Baseline:  Goal status: INITIAL  3.  Pt will complete 2 online practice sessions Baseline:  Goal status: INITIAL  4.  Pt will access [employer] and complete 1 lesson from [employer] Baseline:  Goal status: INITIAL  5.  Pt will average 70dB over 8 minute conversation with occasional min A Baseline:  Goal status: INITIAL    LONG TERM GOALS: Target date: 06/21/24  Pt will complete HEP twice daily 5/7 days with rare min  A Baseline:  Goal status: INITIAL  2.  Pt will average 70dB over 15 minute conversation with rare min A Baseline:  Goal status: INITIAL  3.  Pt will verbalize plan to continue Speak Out! Daily upon d/c from ST Baseline:  Goal status: INITIAL  4.  Pt will improve score on PROM Baseline:  Goal status: INITIAL  ASSESSMENT:  CLINICAL IMPRESSION: Patient is a 78 y.o. 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 He denies any s/s of dysphagia. He reports his wife often needs him to repeat himself and tells him he is speaking too low. [place] is also aware that others have difficulty hearing him and he is having to repeat often.He has difficulty communicating over the phone. Voice is hoarse, intermittently aphonic. I recommend skilled ST to maximize intelligibility for safety,  to reduce caregiver burden and improve social participation.  OBJECTIVE IMPAIRMENTS: Objective impairments include attention, memory, and dysarthria. These impairments are limiting patient from effectively communicating at home and in community.Factors affecting potential to achieve goals and functional outcome are medical prognosis.. Patient will benefit from skilled SLP services to address above impairments and improve overall function.  REHAB POTENTIAL: Good  PLAN:  SLP FREQUENCY: 1-2x/week  SLP DURATION: 12 weeks  PLANNED INTERVENTIONS: Aspiration precaution training, Diet toleration management , Language facilitation, Environmental controls, Cueing hierachy, Cognitive reorganization, Internal/external aids, Functional tasks, Multimodal communication approach, SLP instruction and feedback, Compensatory strategies, Patient/family education, [phone] Treatment of speech (30 or 45 min) , and [id number] Treatment of swallowing function    Jese Comella, Leita Caldron, CCC-SLP 03/29/2024, 3:44 PM

## 2024-03-29 NOTE — Patient Instructions (Addendum)
   Go to the [employer]  Under education you and Slater watch the What is Parkinson's video  Your children should watch it as well  Under Our Program scroll down and watch an online practice session  Set up your E Library account   Slater can attend ST with you - this will be helpful

## 2024-04-02 ENCOUNTER — Ambulatory Visit: Admitting: Physical Therapy

## 2024-04-02 ENCOUNTER — Encounter: Payer: Self-pay | Admitting: Physical Therapy

## 2024-04-02 VITALS — BP 107/69 | HR 66

## 2024-04-02 DIAGNOSIS — R293 Abnormal posture: Secondary | ICD-10-CM | POA: Diagnosis not present

## 2024-04-02 DIAGNOSIS — R2689 Other abnormalities of gait and mobility: Secondary | ICD-10-CM

## 2024-04-02 DIAGNOSIS — R2681 Unsteadiness on feet: Secondary | ICD-10-CM

## 2024-04-02 DIAGNOSIS — Z9181 History of falling: Secondary | ICD-10-CM

## 2024-04-02 DIAGNOSIS — R29818 Other symptoms and signs involving the nervous system: Secondary | ICD-10-CM | POA: Diagnosis not present

## 2024-04-02 DIAGNOSIS — R471 Dysarthria and anarthria: Secondary | ICD-10-CM | POA: Diagnosis not present

## 2024-04-02 NOTE — Therapy (Signed)
 OUTPATIENT PHYSICAL THERAPY NEURO TREATMENT   Patient Name: Timothy Shields MRN: [id number] DOB:03/14/1946, 78 y.o., male Today's Date: 04/02/2024   PCP: Janey Santos, MD  REFERRING PROVIDER: Rosemarie Eather RAMAN, MD   END OF SESSION:  PT End of Session - 04/02/24 1448     Visit Number 8    Number of Visits 17    Date for PT Re-Evaluation 05/05/24    Authorization Type MEDICARE PART A AND B    PT Start Time 1445    PT Stop Time 1527    PT Time Calculation (min) 42 min    Equipment Utilized During Treatment Gait belt    Activity Tolerance Patient tolerated treatment well    Behavior During Therapy WFL for tasks assessed/performed             Past Medical History:  Diagnosis Date   Cerebrovascular disease    Headache(784.0)    Stroke (HCC)    3   Past Surgical History:  Procedure Laterality Date   HERNIA REPAIR     HERNIA REPAIR     Twice in the 1970's   Patient Active Problem List   Diagnosis Date Noted   Erectile dysfunction 09/13/2013   Other generalized ischemic cerebrovascular disease 07/10/2013   Other and unspecified hyperlipidemia 07/10/2013    ONSET DATE: 02/14/2024  REFERRING DIAG: R26.9 (ICD-10-CM) - Gait disorder   THERAPY DIAG:  Unsteadiness on feet  Other abnormalities of gait and mobility  Abnormal posture  History of falling  Rationale for Evaluation and Treatment: Rehabilitation  SUBJECTIVE:                                                                                                                                                                                             SUBJECTIVE STATEMENT: Reports he had one fall, was trying to take a step backwards, but his foot got stuck. Fell on his bottom and did not hurt himself. Was able to get up on his own. Has tried doing some of his exercises.   Pt accompanied by: self  PERTINENT HISTORY:    Per Dr. Rosemarie: remote right PICA and left PCA infarcts in 1982 as well as left basal  ganglia lacunar infarct in 2005 with mild residual right sided weakness with recent increase complaints of worsening gait difficulties likely multifactorial due to combination of old strokes, poststroke parkinsonism and mild spinal stenosis and spondylosis. He also has mild cognitive impairment which appears quite stable    Patient has noticed increasing tremors in his left hand as well as some increase hypophonia and drooling.  He does have some difficulty  at times getting up from the chair or wearing his clothes.  He was seen at [facility] neurology for consultation and started on Sinemet  25/100 mg 1-1/2 tablets 3 times daily. He continues to ambulate with a wheeled walker and feels his balance is poor. He has had a few falls in fact 9 in the last several months fortunately no major injury.   PAIN:  Are you having pain? Yes: NPRS scale: right now it is 0/10 and earlier was 4/10 Pain location: R hip Pain description: achy    PRECAUTIONS: Fall  FALLS: Has patient fallen in last 6 months? Reports 9 falls in the past year, reports most falls occur when going up on his toes   LIVING ENVIRONMENT: Lives with: lives with their spouse, daughter lives close by and son is moving closer  Lives in: House/apartment - will soon be downsizing to an apartment  Stairs: Yes: External: 4 huge stone steps steps; on right going up Has following equipment at home: Single point cane, Walker - 4 wheeled, and Grab bars  PLOF: Independent with household mobility with device, Independent with community mobility with device, Vocation/Vocational requirements: Currently still working, Theatre manager a Physiological scientist, and Leisure: collects books and baseball cards (has been collecting for 56 years)  No driving   PATIENT GOALS: I don't know, everyone just tells me I should come here   OBJECTIVE:  Note: Objective measures were completed at Evaluation unless otherwise noted.  DIAGNOSTIC FINDINGS: MRI brain  09/2021: IMPRESSION: This MRI of the brain with and without contrast shows the following: 1.   Remote left posterior cerebral artery infarction, remote right posterior inferior cerebellar artery infarction and remote lacunar infarction in the left posterior internal capsule.   2.   The brain was otherwise normal for age. 3.   No acute findings.  Normal enhancement pattern.    COGNITION: Overall cognitive status: Within functional limits for tasks assessed   SENSATION: Pt reports some numbness/tingling in RLE, doesn't happen all the time   COORDINATION: Heel to shin: WNL  OBSERVATIONS: Resting LUE tremor  Bilateral ankle swelling (as appt with PCP next month and going to discuss)   POSTURE: rounded shoulders, forward head, and weight shift left, head and trunk tilted to the L    LOWER EXTREMITY MMT:    MMT Right Eval Left Eval  Hip flexion 5 5  Hip extension    Hip abduction    Hip adduction    Hip internal rotation    Hip external rotation    Knee flexion 5 5  Knee extension 5 4+  Ankle dorsiflexion 5 5  Ankle plantarflexion    Ankle inversion    Ankle eversion    (Blank rows = not tested)  BED MOBILITY:   Pt reports no difficulties   TRANSFERS: Sit to stand: SBA  Assistive device utilized: None     Stand to sit: SBA  Assistive device utilized: None      Initial cues for pt to scoot out to the edge before standing with no UE support    FUNCTIONAL TESTS:  5 times sit to stand: 12.7 seconds with no UE support, wide BOS, decr forward lean with incr weight shifted onto heels  10 meter walk test: 11.3 seconds with SPC = 2.90 ft/sec   VITALS  Vitals:   04/02/24 1455  BP: 107/69  Pulse: 66  TREATMENT:  Therapeutic Activity:  Assessed vitals (see above) and WNL today  Discussed opening for aquatic PT tomorrow, but pt unable to  make it.  Provided information and handout for PD symposium coming up in September Pt asking about resources for nutrition and PD  Provided handout from [email or web address] and showed pt additional resource on [email or web address]    SciFit multi-peaks level 7.0 for 8 minutes using BUE/BLEs for neural priming for reciprocal movement, dynamic cardiovascular warmup and increased amplitude of stepping. Cued to maintain steps/min >80 throughout, which pt performed well. Pt reporting RPE as 7/10 when performing the hills    NMR:  Pt performs PWR! Moves in standing position with chair in front of pt as needed    PWR! Up for improved posture - 2 x 10 reps, initial L truncal lean, used mirror in front of pt as cue for midline orientation with pt responding well to this and able to better stand up tall in midline   PWR! Rock for improved weight shifting - 20 reps with cues to look up at hands   PWR! Twist for improved trunk rotation - 10 reps   PWR! Step for improved step initiation  - 10 reps, cues for incr foot clearance esp with RLE   Cues provided for technique and larger amplitude movements. Explained at how each relates to function. Pt reports RPE at 7/10     Gait pattern: step through pattern, decreased step length- Right, decreased stride length, decreased hip/knee flexion- Right, decreased ankle dorsiflexion- Right, festinating, lateral hip instability, lateral lean- Left, trunk flexed, and poor foot clearance- Right Distance walked: Various clinic distances Assistive device utilized: Walker - 4 wheeled and None Level of assistance: Modified independence and SBA Comments: Cues to focus on incr step length/foot clearance with RLE during gait to prevent hearing it squeak on the floor when ambulating during clinic. Pt responds well to these cues. After the SciFit, pt with improved stride length and foot clearance and able to ambulate with no AD with supervision     PATIENT EDUCATION: Education  details: Standing PWR moves to [employer], provided resources for PD and nutrition and PD symposium  Person educated: Patient Education method: Programmer, multimedia, Demonstration, Verbal cues, and Handouts Education comprehension: verbalized understanding, returned demonstration, verbal cues required, and needs further education  HOME EXERCISE PROGRAM: Seated PWR moves  Access Code: [email or web address] URL: [email or web address] Date: 03/22/2024 Prepared by: Marlon Plaster  Exercises - Standing ITB Stretch  - 1 x daily - 7 x weekly - 3 sets - 30-60 seconds hold  GOALS: Goals reviewed with patient? Yes  SHORT TERM GOALS: Target date: 04/03/2024  Pt will be independent with initial HEP for strength, gait, balance in order to build upon functional gains made in therapy. Baseline: Goal status: MET  2.  FOG-Q to be assessed with LTG written. Baseline:  Goal status: MET  3.  TUG/cog TUG to be assessed with LTG written.  Baseline:  Goal status: MET  4.  Pt will be able to demo/verbalize understanding of freezing strategies.  Baseline:  Goal status: INITIAL  5.  Push and release to be assessed with LTG written.  Baseline: assessed on 7/17 Goal status: MET   LONG TERM GOALS: Target date: 05/01/2024  Pt will be independent with final land and aquatic HEP for strength, gait, balance in order to build upon functional gains made in therapy. Baseline:  Goal status: INITIAL  2.  Pt will correct forward balance on push and release test in 3  or fewer steps for improved reactive balance strategies and fall prevention  Baseline: would fall if not caught (7/17) Goal status: INITIAL  3.  Pt will improve TUG to less than or equal to 11 seconds w/LRAD for improved functional mobility and decreased fall risk.  Baseline: 13.53s no AD (7/10) Goal status: REVISED  4.  Pt will score </= 12/24 on FOG-Q for improved implementation of freezing strategies and reduced fall risk  Baseline: 14/24 Goal  status: INITIAL  5.  Pt will verbalize understanding of local Parkinson's disease resources, including options for continue community fitness.  Baseline:  Goal status: INITIAL  6.  Pt will improve gait speed with LRAD to at least 3.3 ft/sec in order to demo improved community mobility.   Baseline: 11.3 seconds with SPC = 2.90 ft/sec Goal status: INITIAL  ASSESSMENT:  CLINICAL IMPRESSION: Pt's BP WNL again this session. Pt's next PT session will be in the pool for aquatic therapy with pt verbalizing understanding of where he needs to go. Plan to work on balance and to give pt exercises that he can perform in his pool at home. Focused on giving pt standing PWR moves to HEP to work on balance, mobility, and larger amplitude movement patterns. Pt with incr L truncal lean, esp with PWR Up, but did well with use of mirror as visual cue for more midline orientation. Pt originally with decr step height with RLE during PWR step, but improved with incr reps. Pt reporting RPE as 7/10. Will continue POC.     OBJECTIVE IMPAIRMENTS: Abnormal gait, decreased activity tolerance, decreased balance, decreased coordination, decreased knowledge of condition, decreased knowledge of use of DME, decreased mobility, difficulty walking, decreased strength, decreased safety awareness, impaired flexibility, impaired sensation, impaired UE functional use, and postural dysfunction.   ACTIVITY LIMITATIONS: stairs, transfers, and locomotion level  PARTICIPATION LIMITATIONS: driving, shopping, and community activity  PERSONAL FACTORS: Age, Behavior pattern, Past/current experiences, Time since onset of injury/illness/exacerbation, and 3+ comorbidities:  Vascular Parkinsonism/Idiopathic PD, mild cognitive impairment, mild spinal stenosis and spondylosis, remote right PICA and left PCA infarcts in 1982 as well as left basal ganglia lacunar infarct in 2005 with mild residual right sided weakness  are also affecting patient's  functional outcome.   REHAB POTENTIAL: Good  CLINICAL DECISION MAKING: Evolving/moderate complexity  EVALUATION COMPLEXITY: Moderate  PLAN:  PT FREQUENCY: 2x/week  PT DURATION: 8 weeks  PLANNED INTERVENTIONS: 97164- PT Re-evaluation, 97110-Therapeutic exercises, 97530- Therapeutic activity, 97112- Neuromuscular re-education, 97535- Self Care, [id number]- Manual therapy, [phone]- Gait training, Patient/Family education, Balance training, Stair training, Vestibular training, and DME instructions  PLAN FOR NEXT SESSION: check BP!!  Work with use of rollator (pt has one at home). Continue to educate on freezing strategies. Postural control, lateral weight shifting, forward and retro stepping   For aquatic PT - work on balance, giving pt HEP that he can perform in his own pool    Crandall, PT, DPT 04/02/2024, 3:28 PM

## 2024-04-04 DIAGNOSIS — Z1212 Encounter for screening for malignant neoplasm of rectum: Secondary | ICD-10-CM | POA: Diagnosis not present

## 2024-04-04 DIAGNOSIS — E785 Hyperlipidemia, unspecified: Secondary | ICD-10-CM | POA: Diagnosis not present

## 2024-04-04 DIAGNOSIS — E559 Vitamin D deficiency, unspecified: Secondary | ICD-10-CM | POA: Diagnosis not present

## 2024-04-04 DIAGNOSIS — Z125 Encounter for screening for malignant neoplasm of prostate: Secondary | ICD-10-CM | POA: Diagnosis not present

## 2024-04-09 ENCOUNTER — Ambulatory Visit: Admitting: Physical Therapy

## 2024-04-12 ENCOUNTER — Ambulatory Visit: Admitting: Physical Therapy

## 2024-04-16 ENCOUNTER — Encounter: Payer: Self-pay | Admitting: Physical Therapy

## 2024-04-16 ENCOUNTER — Ambulatory Visit: Attending: Neurology | Admitting: Physical Therapy

## 2024-04-16 DIAGNOSIS — R29818 Other symptoms and signs involving the nervous system: Secondary | ICD-10-CM | POA: Diagnosis not present

## 2024-04-16 DIAGNOSIS — R29898 Other symptoms and signs involving the musculoskeletal system: Secondary | ICD-10-CM | POA: Insufficient documentation

## 2024-04-16 DIAGNOSIS — M6281 Muscle weakness (generalized): Secondary | ICD-10-CM | POA: Diagnosis not present

## 2024-04-16 DIAGNOSIS — R2681 Unsteadiness on feet: Secondary | ICD-10-CM | POA: Diagnosis not present

## 2024-04-16 DIAGNOSIS — Z9181 History of falling: Secondary | ICD-10-CM | POA: Insufficient documentation

## 2024-04-16 DIAGNOSIS — R2689 Other abnormalities of gait and mobility: Secondary | ICD-10-CM | POA: Diagnosis not present

## 2024-04-16 DIAGNOSIS — R293 Abnormal posture: Secondary | ICD-10-CM | POA: Insufficient documentation

## 2024-04-16 DIAGNOSIS — R471 Dysarthria and anarthria: Secondary | ICD-10-CM | POA: Insufficient documentation

## 2024-04-16 DIAGNOSIS — R278 Other lack of coordination: Secondary | ICD-10-CM | POA: Insufficient documentation

## 2024-04-16 NOTE — Therapy (Signed)
 OUTPATIENT PHYSICAL THERAPY NEURO TREATMENT   Patient Name: Timothy Shields MRN: [id number] DOB:09/14/1945, 78 y.o., male Today's Date: 04/16/2024   PCP: Janey Santos, MD  REFERRING PROVIDER: Rosemarie Eather RAMAN, MD   END OF SESSION:  PT End of Session - 04/16/24 2044     Visit Number 9    Number of Visits 17    Date for PT Re-Evaluation 05/05/24    Authorization Type MEDICARE PART A AND B    PT Start Time 1450    PT Stop Time 1535    PT Time Calculation (min) 45 min    Equipment Utilized During Treatment Other (comment)   bar bells   Activity Tolerance Patient tolerated treatment well    Behavior During Therapy WFL for tasks assessed/performed             Past Medical History:  Diagnosis Date   Cerebrovascular disease    Headache(784.0)    Stroke (HCC)    3   Past Surgical History:  Procedure Laterality Date   HERNIA REPAIR     HERNIA REPAIR     Twice in the 1970's   Patient Active Problem List   Diagnosis Date Noted   Erectile dysfunction 09/13/2013   Other generalized ischemic cerebrovascular disease 07/10/2013   Other and unspecified hyperlipidemia 07/10/2013    ONSET DATE: 02/14/2024  REFERRING DIAG: R26.9 (ICD-10-CM) - Gait disorder   THERAPY DIAG:  Unsteadiness on feet  Other abnormalities of gait and mobility  Other symptoms and signs involving the nervous system  Rationale for Evaluation and Treatment: Rehabilitation  SUBJECTIVE:                                                                                                                                                                                             SUBJECTIVE STATEMENT: Pt presents for initial aquatic PT session at [facility]; is amb. With 4 wheeled RW, accompanied by his daughter; pt states he wants to work on balance and learn some exercises that he can do in his pool at home  Pt accompanied by: daughter  PERTINENT HISTORY:    Per Dr. Rosemarie: remote  right PICA and left PCA infarcts in 1982 as well as left basal ganglia lacunar infarct in 2005 with mild residual right sided weakness with recent increase complaints of worsening gait difficulties likely multifactorial due to combination of old strokes, poststroke parkinsonism and mild spinal stenosis and spondylosis. He also has mild cognitive impairment which appears quite stable    Patient has noticed increasing tremors in his left hand as well as some increase hypophonia and drooling.  He does have some difficulty at times getting up from the chair or wearing his clothes.  He was seen at [facility] neurology for consultation and started on Sinemet  25/100 mg 1-1/2 tablets 3 times daily. He continues to ambulate with a wheeled walker and feels his balance is poor. He has had a few falls in fact 9 in the last several months fortunately no major injury.   PAIN:  Are you having pain? Yes: NPRS scale: right now it is 0/10 and earlier was 4/10 Pain location: R hip Pain description: achy    PRECAUTIONS: Fall  FALLS: Has patient fallen in last 6 months? Reports 9 falls in the past year, reports most falls occur when going up on his toes   LIVING ENVIRONMENT: Lives with: lives with their spouse, daughter lives close by and son is moving closer  Lives in: House/apartment - will soon be downsizing to an apartment  Stairs: Yes: External: 4 huge stone steps steps; on right going up Has following equipment at home: Single point cane, Walker - 4 wheeled, and Grab bars  PLOF: Independent with household mobility with device, Independent with community mobility with device, Vocation/Vocational requirements: Currently still working, Theatre manager a Physiological scientist, and Leisure: collects books and baseball cards (has been collecting for 56 years)  No driving   PATIENT GOALS: I don't know, everyone just tells me I should come here   OBJECTIVE:  Note: Objective measures were completed at Evaluation unless  otherwise noted.  DIAGNOSTIC FINDINGS: MRI brain 09/2021: IMPRESSION: This MRI of the brain with and without contrast shows the following: 1.   Remote left posterior cerebral artery infarction, remote right posterior inferior cerebellar artery infarction and remote lacunar infarction in the left posterior internal capsule.   2.   The brain was otherwise normal for age. 3.   No acute findings.  Normal enhancement pattern.    COGNITION: Overall cognitive status: Within functional limits for tasks assessed   SENSATION: Pt reports some numbness/tingling in RLE, doesn't happen all the time   COORDINATION: Heel to shin: WNL  OBSERVATIONS: Resting LUE tremor  Bilateral ankle swelling (as appt with PCP next month and going to discuss)   POSTURE: rounded shoulders, forward head, and weight shift left, head and trunk tilted to the L    LOWER EXTREMITY MMT:    MMT Right Eval Left Eval  Hip flexion 5 5  Hip extension    Hip abduction    Hip adduction    Hip internal rotation    Hip external rotation    Knee flexion 5 5  Knee extension 5 4+  Ankle dorsiflexion 5 5  Ankle plantarflexion    Ankle inversion    Ankle eversion    (Blank rows = not tested)  BED MOBILITY:   Pt reports no difficulties   TRANSFERS: Sit to stand: SBA  Assistive device utilized: None     Stand to sit: SBA  Assistive device utilized: None      Initial cues for pt to scoot out to the edge before standing with no UE support    FUNCTIONAL TESTS:  5 times sit to stand: 12.7 seconds with no UE support, wide BOS, decr forward lean with incr weight shifted onto heels  10 meter walk test: 11.3 seconds with SPC = 2.90 ft/sec   VITALS  There were no vitals filed for this visit.  TREATMENT:  04-16-24  Aquatic PT at [facility] - pool temp 90 degrees  Patient seen for aquatic  therapy today.  Treatment took place in water 3.6 - 4.5 feet deep depending upon activity.  Pt entered and exited the pool via step negotiation with use of hand rails with supervision.    Warm up: water walking - forwards 2 laps (18' x 4); backwards 2 laps (18' x 4 reps) and sideways 2 laps (18' x 4 reps) with barbells   Stretching:  Runner's stretch for hamstring and gastroc stretch - 20 sec hold x 1 rep each leg Hamstring stretch with foot on pool wall, knee extended 20 sec hold x 1 rep   Marching in place with contralateral UE movement 10 reps each UE/LE; progressed to marching slowly across pool with contralateral UE movement with barbells to facilitate improved SLS on each leg and big amplitude movements with arms    Pt performed stepping forward/back with 1 leg with simultaneously pushing barbells forward during the stepping, then stepping back and pulling barbells back 10 reps each leg; stepping sideways and back in with shoulder horizontal abdct./adduction during the stepping - 10 reps each leg: pt then performed stepping backward/forward with pushing barbells forward/ pulling barbells in while stepping back to starting position - cues to tighten core for improved core stabilization; for facilitation of large step length RLE and LLE   Balance exercises: Standing hip flexion, abduction and extension 10 reps alternating each leg to facilitate weight shift - pt held large barbells for assist with balance; pt had some LOB with these exercises but used flailing of UE 's to recover balance independently   Standing on 1 leg holding large barbells/edge of pool - made circles CW 10 reps each leg  Pt performed Ai Chi postures - Enclosing, Freeing, and Rounding 10 reps each - to improve trunk control, trunk rotation, and weight shifting with incorporation of large amplitude movements of Ue's and LE's   Pt requires the buoyancy of water for active assisted exercises with buoyancy supported for  strengthening and AROM exercises. Hydrostatic pressure also supports joints by unweighting joint load by at least 50 % in 3-4 feet depth water. 80% in chest to neck deep water. Water will provide assistance with movement using the current and laminar flow while the buoyancy reduces weight bearing. Pt requires the viscosity of the water for resistance with strengthening exercises.  Balance exercises able to be safely performed in aquatic environment without the fall risk with performing same exercises on land.        PATIENT EDUCATION: Education details: Standing PWR moves to [employer], provided resources for PD and nutrition and PD symposium  Person educated: Patient Education method: Programmer, multimedia, Demonstration, Verbal cues, and Handouts Education comprehension: verbalized understanding, returned demonstration, verbal cues required, and needs further education  HOME EXERCISE PROGRAM: Seated PWR moves  Access Code: [email or web address] URL: [email or web address] Date: 03/22/2024 Prepared by: Marlon Plaster  Exercises - Standing ITB Stretch  - 1 x daily - 7 x weekly - 3 sets - 30-60 seconds hold  GOALS: Goals reviewed with patient? Yes  SHORT TERM GOALS: Target date: 04/03/2024  Pt will be independent with initial HEP for strength, gait, balance in order to build upon functional gains made in therapy. Baseline: Goal status: MET  2.  FOG-Q to be assessed with LTG written. Baseline:  Goal status: MET  3.  TUG/cog TUG to be assessed with LTG written.  Baseline:  Goal status: MET  4.  Pt will be able to demo/verbalize understanding of freezing strategies.  Baseline:  Goal status: INITIAL  5.  Push and release to be assessed with LTG written.  Baseline: assessed on 7/17 Goal status: MET   LONG TERM GOALS: Target date: 05/01/2024  Pt will be independent with final land and aquatic HEP for strength, gait, balance in order to build upon functional gains made in therapy. Baseline:   Goal status: INITIAL  2.  Pt will correct forward balance on push and release test in 3 or fewer steps for improved reactive balance strategies and fall prevention  Baseline: would fall if not caught (7/17) Goal status: INITIAL  3.  Pt will improve TUG to less than or equal to 11 seconds w/LRAD for improved functional mobility and decreased fall risk.  Baseline: 13.53s no AD (7/10) Goal status: REVISED  4.  Pt will score </= 12/24 on FOG-Q for improved implementation of freezing strategies and reduced fall risk  Baseline: 14/24 Goal status: INITIAL  5.  Pt will verbalize understanding of local Parkinson's disease resources, including options for continue community fitness.  Baseline:  Goal status: INITIAL  6.  Pt will improve gait speed with LRAD to at least 3.3 ft/sec in order to demo improved community mobility.   Baseline: 11.3 seconds with SPC = 2.90 ft/sec Goal status: INITIAL  ASSESSMENT:  CLINICAL IMPRESSION: Aquatic PT session focused on balance exercises with use of barbells and intermittent UE support on pool edge prn for balance recovery.  Session also focused on water walking for improved dynamic balance and Ai Chi postures to facilitate improved trunk control, large amplitude movements of Ue's & LE's and exercises to improve balance with weight shift and SLS.   Pt had postural instability/mild LOB with standing hip exercises and with Ai chi postures but was able to use flailing of arms to independently recover LOB, and he declined use of assistance.  Pt tolerated exercises well but will benefit from additional sessions to address standing balance, trunk control and dynamic gait deficits.     OBJECTIVE IMPAIRMENTS: Abnormal gait, decreased activity tolerance, decreased balance, decreased coordination, decreased knowledge of condition, decreased knowledge of use of DME, decreased mobility, difficulty walking, decreased strength, decreased safety awareness, impaired  flexibility, impaired sensation, impaired UE functional use, and postural dysfunction.   ACTIVITY LIMITATIONS: stairs, transfers, and locomotion level  PARTICIPATION LIMITATIONS: driving, shopping, and community activity  PERSONAL FACTORS: Age, Behavior pattern, Past/current experiences, Time since onset of injury/illness/exacerbation, and 3+ comorbidities:  Vascular Parkinsonism/Idiopathic PD, mild cognitive impairment, mild spinal stenosis and spondylosis, remote right PICA and left PCA infarcts in 1982 as well as left basal ganglia lacunar infarct in 2005 with mild residual right sided weakness  are also affecting patient's functional outcome.   REHAB POTENTIAL: Good  CLINICAL DECISION MAKING: Evolving/moderate complexity  EVALUATION COMPLEXITY: Moderate  PLAN:  PT FREQUENCY: 2x/week  PT DURATION: 8 weeks  PLANNED INTERVENTIONS: 97164- PT Re-evaluation, 97110-Therapeutic exercises, 97530- Therapeutic activity, 97112- Neuromuscular re-education, 97535- Self Care, 02859- Manual therapy, [phone]- Gait training, Patient/Family education, Balance training, Stair training, Vestibular training, and DME instructions  PLAN FOR NEXT SESSION: LAND - 10th visit progress note due  Work with use of rollator (pt has one at home). Continue to educate on freezing strategies. Postural control, lateral weight shifting, forward and retro stepping   For aquatic PT - work on balance, giving pt HEP that he can perform in his own pool    Roxanna [place], PT 04/16/2024, 8:46 PM

## 2024-04-17 DIAGNOSIS — R82998 Other abnormal findings in urine: Secondary | ICD-10-CM | POA: Diagnosis not present

## 2024-04-17 DIAGNOSIS — K5901 Slow transit constipation: Secondary | ICD-10-CM | POA: Diagnosis not present

## 2024-04-17 DIAGNOSIS — Z1331 Encounter for screening for depression: Secondary | ICD-10-CM | POA: Diagnosis not present

## 2024-04-17 DIAGNOSIS — Z1339 Encounter for screening examination for other mental health and behavioral disorders: Secondary | ICD-10-CM | POA: Diagnosis not present

## 2024-04-17 DIAGNOSIS — F5221 Male erectile disorder: Secondary | ICD-10-CM | POA: Diagnosis not present

## 2024-04-17 DIAGNOSIS — Z23 Encounter for immunization: Secondary | ICD-10-CM | POA: Diagnosis not present

## 2024-04-17 DIAGNOSIS — G4701 Insomnia due to medical condition: Secondary | ICD-10-CM | POA: Diagnosis not present

## 2024-04-17 DIAGNOSIS — E559 Vitamin D deficiency, unspecified: Secondary | ICD-10-CM | POA: Diagnosis not present

## 2024-04-17 DIAGNOSIS — I872 Venous insufficiency (chronic) (peripheral): Secondary | ICD-10-CM | POA: Diagnosis not present

## 2024-04-17 DIAGNOSIS — R2681 Unsteadiness on feet: Secondary | ICD-10-CM | POA: Diagnosis not present

## 2024-04-17 DIAGNOSIS — Z0000 Encounter for general adult medical examination without abnormal findings: Secondary | ICD-10-CM | POA: Diagnosis not present

## 2024-04-17 DIAGNOSIS — G20A1 Parkinson's disease without dyskinesia, without mention of fluctuations: Secondary | ICD-10-CM | POA: Diagnosis not present

## 2024-04-17 DIAGNOSIS — M4800 Spinal stenosis, site unspecified: Secondary | ICD-10-CM | POA: Diagnosis not present

## 2024-04-17 DIAGNOSIS — E785 Hyperlipidemia, unspecified: Secondary | ICD-10-CM | POA: Diagnosis not present

## 2024-04-17 DIAGNOSIS — R1310 Dysphagia, unspecified: Secondary | ICD-10-CM | POA: Diagnosis not present

## 2024-04-19 ENCOUNTER — Ambulatory Visit: Admitting: Occupational Therapy

## 2024-04-19 ENCOUNTER — Ambulatory Visit: Admitting: Physical Therapy

## 2024-04-19 VITALS — BP 133/73 | HR 61

## 2024-04-19 DIAGNOSIS — R278 Other lack of coordination: Secondary | ICD-10-CM

## 2024-04-19 DIAGNOSIS — R2681 Unsteadiness on feet: Secondary | ICD-10-CM | POA: Diagnosis not present

## 2024-04-19 DIAGNOSIS — M6281 Muscle weakness (generalized): Secondary | ICD-10-CM

## 2024-04-19 DIAGNOSIS — R2689 Other abnormalities of gait and mobility: Secondary | ICD-10-CM | POA: Diagnosis not present

## 2024-04-19 DIAGNOSIS — Z9181 History of falling: Secondary | ICD-10-CM

## 2024-04-19 DIAGNOSIS — R293 Abnormal posture: Secondary | ICD-10-CM

## 2024-04-19 DIAGNOSIS — R29818 Other symptoms and signs involving the nervous system: Secondary | ICD-10-CM | POA: Diagnosis not present

## 2024-04-19 DIAGNOSIS — R29898 Other symptoms and signs involving the musculoskeletal system: Secondary | ICD-10-CM

## 2024-04-19 NOTE — Therapy (Signed)
 OUTPATIENT OCCUPATIONAL THERAPY PARKINSON'S EVALUATION  Patient Name: Timothy Shields MRN: [id number] DOB:1946-04-27, 78 y.o., male Today's Date: 04/19/2024  PCP: Janey Santos, MD  REFERRING PROVIDER: Morita, Hokuto, MD  END OF SESSION:  OT End of Session - 04/19/24 1533     Visit Number 2    Number of Visits 17   max number of visits possible   Date for OT Re-Evaluation 06/22/24    Authorization Type Medicare    OT Start Time 1532    OT Stop Time 1613    OT Time Calculation (min) 41 min    Activity Tolerance Patient tolerated treatment well    Behavior During Therapy [facility] for tasks assessed/performed         Past Medical History:  Diagnosis Date   Cerebrovascular disease    Headache(784.0)    Stroke (HCC)    3   Past Surgical History:  Procedure Laterality Date   HERNIA REPAIR     HERNIA REPAIR     Twice in the 1970's   Patient Active Problem List   Diagnosis Date Noted   Erectile dysfunction 09/13/2013   Other generalized ischemic cerebrovascular disease 07/10/2013   Other and unspecified hyperlipidemia 07/10/2013   ONSET DATE: 03/01/2024 (Date of referral)  REFERRING DIAG: G20.A1 (ICD-10-CM) - Parkinson's disease without dyskinesia, without mention of fluctuations  THERAPY DIAG:  Muscle weakness (generalized)  Other symptoms and signs involving the nervous system  Other symptoms and signs involving the musculoskeletal system  Other lack of coordination  Rationale for Evaluation and Treatment: Rehabilitation  SUBJECTIVE:   SUBJECTIVE STATEMENT: He had the best physical in a long time since he started taking his PD medications.   Pt accompanied by: self  PERTINENT HISTORY: Per Dr. Rosemarie: remote right PICA and left PCA infarcts in 1982 as well as left basal ganglia lacunar infarct in 2005 with mild residual right sided weakness with recent increase complaints of worsening gait difficulties likely multifactorial due to combination of old strokes,  poststroke parkinsonism and mild spinal stenosis and spondylosis. He also has mild cognitive impairment which appears quite stable     Patient has noticed increasing tremors in his left hand as well as some increase hypophonia and drooling.  He does have some difficulty at times getting up from the chair or wearing his clothes.  He was seen at [facility] neurology for consultation and started on Sinemet  25/100 mg 1-1/2 tablets 3 times daily. He continues to ambulate with a wheeled walker and feels his balance is poor. He has had a few falls in fact 9 in the last several months fortunately no major injury.   Had a driving evaluation from [facility] and was told not to drive. Has had 9 falls in the past year. Goes to a Systems analyst 3x a week.   PRECAUTIONS: Fall  WEIGHT BEARING RESTRICTIONS: No  PAIN:  Are you having pain? No  FALLS: Has patient fallen in last 6 months? Yes. Number of falls 9  LIVING ENVIRONMENT: Lives with: lives with their spouse, daughter lives close by and son is moving closer  Lives in: House/apartment - will soon be downsizing to an apartment  Stairs: Yes: External: 4 huge stone steps steps; on right going up Has following equipment at home: Single point cane, Walker - 4 wheeled, and Grab bars  PLOF: Independent with household mobility with device, Independent with community mobility with device, Vocation/Vocational requirements: Currently still working, Economist of a Physiological scientist, and Leisure: collects books and baseball  cards (has been collecting for 56 years); not driving (as of a few weeks ago)   PATIENT GOALS: PD management  OBJECTIVE:  Note: Objective measures were completed at Evaluation unless otherwise noted.  HAND DOMINANCE: Right  ADLs: Overall ADLs: mostly mod I; requires assistance with buttons, especially with sleeve cuffs  IADLs: Handwriting: 90% legible, Severe micrographia, and pt reports he has always written small, but it has gotten even  smaller. It is better if he thinks about what he is going to write and takes his time. There are times that he writes something down and then is unable to determine what he wrote.   MOBILITY STATUS: See PT notes  POSTURE COMMENTS:  rounded shoulders, forward head, and weight shift left, head and trunk tilted to the L   ACTIVITY TOLERANCE: Activity tolerance: good to fair  FUNCTIONAL OUTCOME MEASURES: Fastening/unfastening 3 buttons: TBD Physical performance test: PPT#2 (simulated eating) TBD & PPT#4 (donning/doffing jacket): TBD  COORDINATION: 9 Hole Peg test: Right: TBD sec; Left: TBD sec  UE ROM:  TBD  MUSCLE TONE: TBD  COGNITION: Overall cognitive status: History of cognitive impairments - at baseline  OBSERVATIONS: resting LUE tremor; impulsive; mild bradykinesia                                                                                                                   TREATMENT:  - Self-care/home management completed for duration as noted below including: Objective measures assessed as noted in Goals section to determine progression towards goals. Therapist reviewed goals with patient and updated patient progression.  No additional functional limitations identified.  - Therapeutic exercises completed for duration as noted below including: OT initiated PWR! Hands. Education provided on the importance of completion and why these exercises are important given PD progression.   PATIENT EDUCATION: Education details: Goal updates; PWR! Hands Person educated: Patient Education method: Explanation, Demonstration, Verbal cues, and Handouts Education comprehension: verbalized understanding, returned demonstration, verbal cues required, and needs further education  HOME EXERCISE PROGRAM: 03/23/2024: handwriting samples 04/19/2024: PWR! Hands  GOALS:  SHORT TERM GOALS: Target date: 05/17/2024    Pt will be independent with PD specific HEP.  Baseline: not yet  initiated Goal status: IN PROGRESS  2.  Pt will verbalize understanding of adapted strategies to maximize safety and independence with ADLs/IADLs.  Baseline: not yet initiated Goal status: INITIAL  3.  Will set appropriate coordination based goal. Baseline: not yet initiated Goal status: INITIAL  LONG TERM GOALS: Target date: 06/22/24    Pt will verbalize understanding of ways to prevent future PD related complications and PD community resources.  Baseline: not yet initiated Goal status: INITIAL  2.  Pt will write a short paragraph with no significant decrease in size and maintain 100% legibility.  Baseline: 90% legibility severe micrographia Goal status: INITIAL  3.  Pt will verbalize understanding of ways to keep thinking skills sharp and ways to compensate for STM changes in the future.  Baseline: not yet initiated Goal status: INITIAL  4.  Pt will be able to place at least 5 additional blocks using R hand with completion of Box and Blocks test.  Baseline: 04/19/2024: Right - 42 blocks; Left - 49 blocks Goal status: INITIAL   5.  Pt will demonstrate improved ease with fastening buttons as evidenced by decreasing 3 button/unbutton time by at least 5 seconds or more as appropriate.  Baseline: 04/19/2024: 42 seconds Goal status: INITIAL  6.  Pt will demonstrate improved fine motor coordination for ADLs as evidenced by decreasing 9 hole peg test score for each hand by at least 5 secs or more as appropriate.  Baseline: 04/19/2024: Right - 33 seconds; Left - 38 seconds Goal status: INITIAL   ASSESSMENT:  CLINICAL IMPRESSION: Patient demonstrates good understanding of HEP as needed to establish a formal PD HEP. Recommend pt setup a PD Binder to promote carryover.   PERFORMANCE DEFICITS: in functional skills including ADLs, IADLs, coordination, ROM, Fine motor control, Gross motor control, mobility, balance, decreased knowledge of precautions, decreased knowledge of use of  DME, and UE functional use.   IMPAIRMENTS: are limiting patient from ADLs, IADLs, rest and sleep, leisure, and social participation.   COMORBIDITIES:  may have co-morbidities  that affects occupational performance. Patient will benefit from skilled OT to address above impairments and improve overall function.  REHAB POTENTIAL: Good  PLAN:  OT FREQUENCY: 2x/week  OT DURATION: 8 weeks  PLANNED INTERVENTIONS: 97168 OT Re-evaluation, 97535 self care/ADL training, 02889 therapeutic exercise, 97530 therapeutic activity, 97112 neuromuscular re-education, functional mobility training, coping strategies training, patient/family education, and DME and/or AE instructions  RECOMMENDED OTHER SERVICES: N/A for this visit  CONSULTED AND AGREED WITH PLAN OF CARE: Patient  PLAN FOR NEXT SESSION: encourage pt to speak LOUDLY; review PWR! Hands; Initiate PD HEP   Jocelyn CHRISTELLA Bottom, OT 04/19/2024, 4:42 PM

## 2024-04-19 NOTE — Therapy (Signed)
 OUTPATIENT PHYSICAL THERAPY NEURO TREATMENT- 10TH VISIT PROGRESS NOTE    Patient Name: Timothy Shields MRN: [id number] DOB:04-20-1946, 78 y.o., male Today's Date: 04/19/2024   PCP: Janey Santos, MD  REFERRING PROVIDER: Rosemarie Eather RAMAN, MD  Physical Therapy Progress Note   Dates of Reporting Period:03/06/24 - 04/19/24  See Note below for Objective Data and Assessment of Progress/Goals.    END OF SESSION:  PT End of Session - 04/19/24 1448     Visit Number 10    Number of Visits 17    Date for PT Re-Evaluation 05/05/24    Authorization Type MEDICARE PART A AND B    PT Start Time 1445    PT Stop Time 1530    PT Time Calculation (min) 45 min    Equipment Utilized During Treatment Gait belt    Activity Tolerance Patient tolerated treatment well    Behavior During Therapy WFL for tasks assessed/performed              Past Medical History:  Diagnosis Date   Cerebrovascular disease    Headache(784.0)    Stroke (HCC)    3   Past Surgical History:  Procedure Laterality Date   HERNIA REPAIR     HERNIA REPAIR     Twice in the 1970's   Patient Active Problem List   Diagnosis Date Noted   Erectile dysfunction 09/13/2013   Other generalized ischemic cerebrovascular disease 07/10/2013   Other and unspecified hyperlipidemia 07/10/2013    ONSET DATE: 02/14/2024  REFERRING DIAG: R26.9 (ICD-10-CM) - Gait disorder   THERAPY DIAG:  Unsteadiness on feet  Abnormal posture  History of falling  Muscle weakness (generalized)  Rationale for Evaluation and Treatment: Rehabilitation  SUBJECTIVE:                                                                                                                                                                                             SUBJECTIVE STATEMENT: Pt presents with rollator, states he is doing really well. Had a good beach trip. Thinks he had more than one backwards fall since last visit, but unsure. Knows one  happened when stepping out of the car, no injuries.   Pt accompanied by: Self  PERTINENT HISTORY:    Per Dr. Rosemarie: remote right PICA and left PCA infarcts in 1982 as well as left basal ganglia lacunar infarct in 2005 with mild residual right sided weakness with recent increase complaints of worsening gait difficulties likely multifactorial due to combination of old strokes, poststroke parkinsonism and mild spinal stenosis and spondylosis. He also has mild cognitive impairment which appears quite stable  Patient has noticed increasing tremors in his left hand as well as some increase hypophonia and drooling.  He does have some difficulty at times getting up from the chair or wearing his clothes.  He was seen at [facility] neurology for consultation and started on Sinemet  25/100 mg 1-1/2 tablets 3 times daily. He continues to ambulate with a wheeled walker and feels his balance is poor. He has had a few falls in fact 9 in the last several months fortunately no major injury.   PAIN:  Are you having pain? No   PRECAUTIONS: Fall  FALLS: Has patient fallen in last 6 months? Reports 9 falls in the past year, reports most falls occur when going up on his toes   LIVING ENVIRONMENT: Lives with: lives with their spouse, daughter lives close by and son is moving closer  Lives in: House/apartment - will soon be downsizing to an apartment  Stairs: Yes: External: 4 huge stone steps steps; on right going up Has following equipment at home: Single point cane, Walker - 4 wheeled, and Grab bars  PLOF: Independent with household mobility with device, Independent with community mobility with device, Vocation/Vocational requirements: Currently still working, Theatre manager a Physiological scientist, and Leisure: collects books and baseball cards (has been collecting for 56 years)  No driving   PATIENT GOALS: I don't know, everyone just tells me I should come here   OBJECTIVE:  Note: Objective measures were  completed at Evaluation unless otherwise noted.  DIAGNOSTIC FINDINGS: MRI brain 09/2021: IMPRESSION: This MRI of the brain with and without contrast shows the following: 1.   Remote left posterior cerebral artery infarction, remote right posterior inferior cerebellar artery infarction and remote lacunar infarction in the left posterior internal capsule.   2.   The brain was otherwise normal for age. 3.   No acute findings.  Normal enhancement pattern.    COGNITION: Overall cognitive status: Within functional limits for tasks assessed   SENSATION: Pt reports some numbness/tingling in RLE, doesn't happen all the time   COORDINATION: Heel to shin: WNL  OBSERVATIONS: Resting LUE tremor  Bilateral ankle swelling (as appt with PCP next month and going to discuss)   POSTURE: rounded shoulders, forward head, and weight shift left, head and trunk tilted to the L    LOWER EXTREMITY MMT:    MMT Right Eval Left Eval  Hip flexion 5 5  Hip extension    Hip abduction    Hip adduction    Hip internal rotation    Hip external rotation    Knee flexion 5 5  Knee extension 5 4+  Ankle dorsiflexion 5 5  Ankle plantarflexion    Ankle inversion    Ankle eversion    (Blank rows = not tested)  BED MOBILITY:   Pt reports no difficulties   TRANSFERS: Sit to stand: SBA  Assistive device utilized: None     Stand to sit: SBA  Assistive device utilized: None      Initial cues for pt to scoot out to the edge before standing with no UE support    FUNCTIONAL TESTS:  5 times sit to stand: 12.7 seconds with no UE support, wide BOS, decr forward lean with incr weight shifted onto heels  10 meter walk test: 11.3 seconds with SPC = 2.90 ft/sec   VITALS  Vitals:   04/19/24 1453  BP: 133/73  Pulse: 61  TREATMENT:    Self-care/home management  Assessed  vitals (see above) and WNL   Ther Act  SciFit multi-peaks level 12.0 for 8 minutes using BUE/BLEs for neural priming for reciprocal movement, dynamic cardiovascular warmup and increased amplitude of stepping. RPE of 5-6/10 following activity   NMR  Wall balls using 2kg yellow ball to two post-it targets placed superolaterally on wall for improved high amplitude movement, anterior weight shifting, anticipatory balance and cog dual-tasking. Had pt throw ball to L post-it if therapist called out animal and to R post-it if therapist called out food and further had pt name an animal or food. Pt decided to name each in alphabetical order for added challenge. Pt able to perform proper wall ball (squat w/ball toss to wall) or name an animal/food, but could not perform concurrently. Noted pt would not squat if throwing to L side, but could squat to R. However, pt unable to remember foods quite as well as animals. CGA throughout for safety and pt had two anterior LOB episodes.  Posterior sled pulls using stedy with second therapist seated in it, 6x 25' distances, for improved stability w/retro stepping and coordination w/turns. Pt demonstrates narrow BOS w/retro stepping w/min scissoring noted w/LLE. Min cues for LARGE steps throughout. Pt also performing crossover step w/LLE when turning to R, so min cues to lead w/R foot if turning to R and noted increased stability. Pt had single instance of freezing and posterior LOB, requiring min A to stabilize.  Added aquatic visit for 8/20 at 2pm and provided pt with paper note w/appointment date and time, as it has not been added to pt's schedule yet.   Gait pattern: step through pattern, decreased arm swing- Right, decreased arm swing- Left, decreased stride length, decreased ankle dorsiflexion- Right, decreased ankle dorsiflexion- Left, shuffling, festinating, trunk flexed, narrow BOS, poor foot clearance- Right, and poor foot clearance- Left Distance walked: Various  clinic distances  Assistive device utilized: Walker - 4 wheeled and None Level of assistance: Modified independence and SBA Comments: Decreased step clearance noted w/RLE > LLE. Pt w/shuffled gait w/use of rollator w/festination but improved step clearance and posture w/no AD.       PATIENT EDUCATION: Education details: Continue HEP, next aquatic appointment date  Person educated: Patient Education method: Programmer, multimedia, Demonstration, Verbal cues, and Handouts Education comprehension: verbalized understanding, returned demonstration, verbal cues required, and needs further education  HOME EXERCISE PROGRAM: Seated PWR moves  Access Code: [email or web address] URL: [email or web address] Date: 03/22/2024 Prepared by: Marlon Dyson Sevey  Exercises - Standing ITB Stretch  - 1 x daily - 7 x weekly - 3 sets - 30-60 seconds hold  GOALS: Goals reviewed with patient? Yes  SHORT TERM GOALS: Target date: 04/03/2024  Pt will be independent with initial HEP for strength, gait, balance in order to build upon functional gains made in therapy. Baseline: Goal status: MET  2.  FOG-Q to be assessed with LTG written. Baseline:  Goal status: MET  3.  TUG/cog TUG to be assessed with LTG written.  Baseline:  Goal status: MET  4.  Pt will be able to demo/verbalize understanding of freezing strategies.  Baseline:  Goal status: INITIAL  5.  Push and release to be assessed with LTG written.  Baseline: assessed on 7/17 Goal status: MET   LONG TERM GOALS: Target date: 05/01/2024  Pt will be independent with final land and aquatic HEP for strength, gait, balance in order to build upon functional gains made in therapy. Baseline:  Goal status:  INITIAL  2.  Pt will correct forward balance on push and release test in 3 or fewer steps for improved reactive balance strategies and fall prevention  Baseline: would fall if not caught (7/17) Goal status: INITIAL  3.  Pt will improve TUG to less than or  equal to 11 seconds w/LRAD for improved functional mobility and decreased fall risk.  Baseline: 13.53s no AD (7/10) Goal status: REVISED  4.  Pt will score </= 12/24 on FOG-Q for improved implementation of freezing strategies and reduced fall risk  Baseline: 14/24 Goal status: INITIAL  5.  Pt will verbalize understanding of local Parkinson's disease resources, including options for continue community fitness.  Baseline:  Goal status: INITIAL  6.  Pt will improve gait speed with LRAD to at least 3.3 ft/sec in order to demo improved community mobility.   Baseline: 11.3 seconds with SPC = 2.90 ft/sec Goal status: INITIAL  ASSESSMENT:  CLINICAL IMPRESSION: Emphasis of skilled PT session on high amplitude movement, cog dual-asking and stability w/retro stepping. Pt reports he has had a few falls since last land visit, all in retro direction due to freezing. Noted narrow BOS w/retro gait but pt did improve w/cues for large steps. Pt challenged by cog-motor dual-task, unable to perform both concurrently. Noted increased difficulty w/tasks directed at L side vs. R. Pt demonstrates shuffled gait pattern w/use of rollator due to increased trunk flexion but is able to ambulate without AD at SBA level if focused on taking large steps. Continue POC.     OBJECTIVE IMPAIRMENTS: Abnormal gait, decreased activity tolerance, decreased balance, decreased coordination, decreased knowledge of condition, decreased knowledge of use of DME, decreased mobility, difficulty walking, decreased strength, decreased safety awareness, impaired flexibility, impaired sensation, impaired UE functional use, and postural dysfunction.   ACTIVITY LIMITATIONS: stairs, transfers, and locomotion level  PARTICIPATION LIMITATIONS: driving, shopping, and community activity  PERSONAL FACTORS: Age, Behavior pattern, Past/current experiences, Time since onset of injury/illness/exacerbation, and 3+ comorbidities:  Vascular  Parkinsonism/Idiopathic PD, mild cognitive impairment, mild spinal stenosis and spondylosis, remote right PICA and left PCA infarcts in 1982 as well as left basal ganglia lacunar infarct in 2005 with mild residual right sided weakness  are also affecting patient's functional outcome.   REHAB POTENTIAL: Good  CLINICAL DECISION MAKING: Evolving/moderate complexity  EVALUATION COMPLEXITY: Moderate  PLAN:  PT FREQUENCY: 2x/week  PT DURATION: 8 weeks  PLANNED INTERVENTIONS: 97164- PT Re-evaluation, 97110-Therapeutic exercises, 97530- Therapeutic activity, 97112- Neuromuscular re-education, 97535- Self Care, [id number]- Manual therapy, [phone]- Gait training, Patient/Family education, Balance training, Stair training, Vestibular training, and DME instructions  PLAN FOR NEXT SESSION:  Work with use of rollator (pt has one at home). Continue to educate on freezing strategies. Postural control, lateral weight shifting, forward and retro stepping, dual-tasking  For aquatic PT - work on balance, giving pt HEP that he can perform in his own pool    Orlandus Borowski E Nason Conradt, PT, DPT 04/19/2024, 3:32 PM

## 2024-04-19 NOTE — Patient Instructions (Signed)
 PWR! Hand Exercises Perform each exercise at least 10 repetitions 1x/day, and PWR! PUSH throughout the day when you are having trouble using your hands (picking up small objects, writing, eating, typing, buttoning, etc.). ** Make each movement big and deliberate; feel the movement.  PWR! UP: Fists to open fingers BIG  PWR! Rock:  Move wrists up and down [employer]! Twist: Twist palms up and down BIG  PWR! Step: Step your thumb to index finger while keeping other fingers straight. Flick fingers out BIG (thumb out/straighten fingers). Repeat with other fingers.  PWR! PUSH: Push hands out BIG. Elbows straight, wrists up, fingers open and spread apart BIG.

## 2024-04-23 ENCOUNTER — Ambulatory Visit: Admitting: Physical Therapy

## 2024-04-23 ENCOUNTER — Encounter: Admitting: Occupational Therapy

## 2024-04-25 ENCOUNTER — Ambulatory Visit: Admitting: Physical Therapy

## 2024-04-25 ENCOUNTER — Encounter: Payer: Self-pay | Admitting: Physical Therapy

## 2024-04-25 DIAGNOSIS — R29898 Other symptoms and signs involving the musculoskeletal system: Secondary | ICD-10-CM | POA: Diagnosis not present

## 2024-04-25 DIAGNOSIS — M6281 Muscle weakness (generalized): Secondary | ICD-10-CM | POA: Diagnosis not present

## 2024-04-25 DIAGNOSIS — R2681 Unsteadiness on feet: Secondary | ICD-10-CM | POA: Diagnosis not present

## 2024-04-25 DIAGNOSIS — R29818 Other symptoms and signs involving the nervous system: Secondary | ICD-10-CM | POA: Diagnosis not present

## 2024-04-25 DIAGNOSIS — R2689 Other abnormalities of gait and mobility: Secondary | ICD-10-CM | POA: Diagnosis not present

## 2024-04-25 DIAGNOSIS — R278 Other lack of coordination: Secondary | ICD-10-CM | POA: Diagnosis not present

## 2024-04-25 NOTE — Therapy (Signed)
 OUTPATIENT PHYSICAL THERAPY NEURO/AQUATIC THERAPY TREATMENT   Patient Name: Timothy Shields MRN: [id number] DOB:06/02/1946, 78 y.o., male Today's Date: 04/25/2024   PCP: Janey Santos, MD  REFERRING PROVIDER: Rosemarie Eather RAMAN, MD   END OF SESSION:  PT End of Session - 04/25/24 2000     Visit Number 11    Number of Visits 17    Date for PT Re-Evaluation 05/05/24    Authorization Type MEDICARE PART A AND B    PT Start Time 1400    PT Stop Time 1450    PT Time Calculation (min) 50 min    Equipment Utilized During Treatment Other (comment)   large barbells, yellow noodle   Activity Tolerance Patient tolerated treatment well    Behavior During Therapy WFL for tasks assessed/performed             Past Medical History:  Diagnosis Date   Cerebrovascular disease    Headache(784.0)    Stroke (HCC)    3   Past Surgical History:  Procedure Laterality Date   HERNIA REPAIR     HERNIA REPAIR     Twice in the 1970's   Patient Active Problem List   Diagnosis Date Noted   Erectile dysfunction 09/13/2013   Other generalized ischemic cerebrovascular disease 07/10/2013   Other and unspecified hyperlipidemia 07/10/2013    ONSET DATE: 02/14/2024  REFERRING DIAG: R26.9 (ICD-10-CM) - Gait disorder   THERAPY DIAG:  Other abnormalities of gait and mobility  Unsteadiness on feet  Rationale for Evaluation and Treatment: Rehabilitation  SUBJECTIVE:                                                                                                                                                                                             SUBJECTIVE STATEMENT: Pt enters pool area from [employer] locker room without use of assistive device;  pt reports he is considering joining [facility] - really likes the pool  Pt accompanied by: daughter  PERTINENT HISTORY:    Per Dr. Rosemarie: remote right PICA and left PCA infarcts in 1982 as well as left basal ganglia lacunar infarct in 2005 with  mild residual right sided weakness with recent increase complaints of worsening gait difficulties likely multifactorial due to combination of old strokes, poststroke parkinsonism and mild spinal stenosis and spondylosis. He also has mild cognitive impairment which appears quite stable    Patient has noticed increasing tremors in his left hand as well as some increase hypophonia and drooling.  He does have some difficulty at times getting up from the chair or wearing his clothes.  He was seen at [facility]  neurology for consultation and started on Sinemet  25/100 mg 1-1/2 tablets 3 times daily. He continues to ambulate with a wheeled walker and feels his balance is poor. He has had a few falls in fact 9 in the last several months fortunately no major injury.   PAIN:  Are you having pain? Yes: NPRS scale: right now it is 0/10  Pain location: R hip Pain description: achy    PRECAUTIONS: Fall  FALLS: Has patient fallen in last 6 months? Reports 9 falls in the past year, reports most falls occur when going up on his toes   LIVING ENVIRONMENT: Lives with: lives with their spouse, daughter lives close by and son is moving closer  Lives in: House/apartment - will soon be downsizing to an apartment  Stairs: Yes: External: 4 huge stone steps steps; on right going up Has following equipment at home: Single point cane, Walker - 4 wheeled, and Grab bars  PLOF: Independent with household mobility with device, Independent with community mobility with device, Vocation/Vocational requirements: Currently still working, Theatre manager a Physiological scientist, and Leisure: collects books and baseball cards (has been collecting for 56 years)  No driving   PATIENT GOALS: I don't know, everyone just tells me I should come here   OBJECTIVE:  Note: Objective measures were completed at Evaluation unless otherwise noted.  DIAGNOSTIC FINDINGS: MRI brain 09/2021: IMPRESSION: This MRI of the brain with and without contrast  shows the following: 1.   Remote left posterior cerebral artery infarction, remote right posterior inferior cerebellar artery infarction and remote lacunar infarction in the left posterior internal capsule.   2.   The brain was otherwise normal for age. 3.   No acute findings.  Normal enhancement pattern.    COGNITION: Overall cognitive status: Within functional limits for tasks assessed   SENSATION: Pt reports some numbness/tingling in RLE, doesn't happen all the time   COORDINATION: Heel to shin: WNL  OBSERVATIONS: Resting LUE tremor  Bilateral ankle swelling (as appt with PCP next month and going to discuss)   POSTURE: rounded shoulders, forward head, and weight shift left, head and trunk tilted to the L    LOWER EXTREMITY MMT:    MMT Right Eval Left Eval  Hip flexion 5 5  Hip extension    Hip abduction    Hip adduction    Hip internal rotation    Hip external rotation    Knee flexion 5 5  Knee extension 5 4+  Ankle dorsiflexion 5 5  Ankle plantarflexion    Ankle inversion    Ankle eversion    (Blank rows = not tested)  BED MOBILITY:   Pt reports no difficulties   TRANSFERS: Sit to stand: SBA  Assistive device utilized: None     Stand to sit: SBA  Assistive device utilized: None      Initial cues for pt to scoot out to the edge before standing with no UE support    FUNCTIONAL TESTS:  5 times sit to stand: 12.7 seconds with no UE support, wide BOS, decr forward lean with incr weight shifted onto heels  10 meter walk test: 11.3 seconds with SPC = 2.90 ft/sec   VITALS  There were no vitals filed for this visit.  TREATMENT:  04-23-24  Aquatic PT at [facility] - pool temp 90 degrees  Patient seen for aquatic therapy today.  Treatment took place in water 3.6 - 4.8 feet deep depending upon activity.  Pt entered and exited the  pool via step negotiation with use of hand rails with supervision.    Warm up: water walking - forwards 4 laps (18' x 8); backwards 2 laps (18' x 4 reps) and sideways 4 laps (18' x 8 reps) with large barbells   Stretching:  Runner's stretch for hamstring and gastroc stretch - 20 sec hold x 1 rep each leg Hamstring stretch with foot on pool wall, knee extended 20 sec hold x 1 rep - each leg    Marching in place with contralateral UE movement 10 reps each UE/LE x 2 sets; progressed to marching slowly across pool with contralateral UE movement with barbells to facilitate improved SLS on each leg and big amplitude movements with arms   Pt performed crossovers front only 18' x 2 reps; stepping behind 18' x 2 reps; then combined for braiding 18' x 2 reps for improved balance with narrow BOS - pt held large barbells for assist with balance  Squats - 10 reps - pt held large barbells - wide BOS   Pt performed stepping forward/back with 1 leg with simultaneously pushing barbells forward during the stepping, then stepping back and pulling barbells back 10 reps each leg; stepping sideways and back in with shoulder horizontal abdct./adduction during the stepping - 10 reps each leg: pt then performed stepping backward/forward with pushing barbells forward/ pulling barbells in while stepping back to starting position - cues to tighten core for improved core stabilization; for facilitation of large step length RLE and LLE   Balance exercises: Standing hip flexion, abduction and extension 10 reps alternating each leg to facilitate weight shift - pt held large barbells for assist with balance; pt able to maintain balance majority of time with these exercises - less flailing of arms in today's session compared to that in initial session last week   Standing on 1 leg holding large barbells/edge of pool - made circles CW 10 reps each leg, then CCW 10 reps each leg  Pt performed Ai Chi postures - Enclosing, Freeing,  and Rounding 10 reps each - to improve trunk control, trunk rotation, and weight shifting with incorporation of large amplitude movements of Ue's and LE's   Pt requires the buoyancy of water for active assisted exercises with buoyancy supported for strengthening and AROM exercises. Hydrostatic pressure also supports joints by unweighting joint load by at least 50 % in 3-4 feet depth water. 80% in chest to neck deep water. Water will provide assistance with movement using the current and laminar flow while the buoyancy reduces weight bearing. Pt requires the viscosity of the water for resistance with strengthening exercises.  Balance exercises able to be safely performed in aquatic environment without the fall risk with performing same exercises on land.        PATIENT EDUCATION: Education details: Standing PWR moves to [employer], provided resources for PD and nutrition and PD symposium  Person educated: Patient Education method: Programmer, multimedia, Demonstration, Verbal cues, and Handouts Education comprehension: verbalized understanding, returned demonstration, verbal cues required, and needs further education  HOME EXERCISE PROGRAM: Seated PWR moves  Access Code: [email or web address] URL: [email or web address] Date: 03/22/2024 Prepared by: Marlon Plaster  Exercises - Standing ITB Stretch  - 1 x daily - 7 x weekly - 3 sets - 30-60 seconds  hold  GOALS: Goals reviewed with patient? Yes  SHORT TERM GOALS: Target date: 04/03/2024  Pt will be independent with initial HEP for strength, gait, balance in order to build upon functional gains made in therapy. Baseline: Goal status: MET  2.  FOG-Q to be assessed with LTG written. Baseline:  Goal status: MET  3.  TUG/cog TUG to be assessed with LTG written.  Baseline:  Goal status: MET  4.  Pt will be able to demo/verbalize understanding of freezing strategies.  Baseline:  Goal status: INITIAL  5.  Push and release to be assessed with LTG  written.  Baseline: assessed on 7/17 Goal status: MET   LONG TERM GOALS: Target date: 05/01/2024  Pt will be independent with final land and aquatic HEP for strength, gait, balance in order to build upon functional gains made in therapy. Baseline:  Goal status: INITIAL  2.  Pt will correct forward balance on push and release test in 3 or fewer steps for improved reactive balance strategies and fall prevention  Baseline: would fall if not caught (7/17) Goal status: INITIAL  3.  Pt will improve TUG to less than or equal to 11 seconds w/LRAD for improved functional mobility and decreased fall risk.  Baseline: 13.53s no AD (7/10) Goal status: REVISED  4.  Pt will score </= 12/24 on FOG-Q for improved implementation of freezing strategies and reduced fall risk  Baseline: 14/24 Goal status: INITIAL  5.  Pt will verbalize understanding of local Parkinson's disease resources, including options for continue community fitness.  Baseline:  Goal status: INITIAL  6.  Pt will improve gait speed with LRAD to at least 3.3 ft/sec in order to demo improved community mobility.   Baseline: 11.3 seconds with SPC = 2.90 ft/sec Goal status: INITIAL  ASSESSMENT:  CLINICAL IMPRESSION: Aquatic PT session focused on balance exercises with use of barbells for UE support and water walking in various directions.  Pt demonstrated much improved balance in today's session compared to that in initial session last week.  Pt did not hold pool edge for assist with balance with any exercise in today's session, and had much less flailing of arms to assist with balance recovery.  Pt reported feeling a stretch in hip adductors in each leg with standing hip abduction.  Pt tolerated exercises well.  Cont with POC.      OBJECTIVE IMPAIRMENTS: Abnormal gait, decreased activity tolerance, decreased balance, decreased coordination, decreased knowledge of condition, decreased knowledge of use of DME, decreased mobility,  difficulty walking, decreased strength, decreased safety awareness, impaired flexibility, impaired sensation, impaired UE functional use, and postural dysfunction.   ACTIVITY LIMITATIONS: stairs, transfers, and locomotion level  PARTICIPATION LIMITATIONS: driving, shopping, and community activity  PERSONAL FACTORS: Age, Behavior pattern, Past/current experiences, Time since onset of injury/illness/exacerbation, and 3+ comorbidities:  Vascular Parkinsonism/Idiopathic PD, mild cognitive impairment, mild spinal stenosis and spondylosis, remote right PICA and left PCA infarcts in 1982 as well as left basal ganglia lacunar infarct in 2005 with mild residual right sided weakness  are also affecting patient's functional outcome.   REHAB POTENTIAL: Good  CLINICAL DECISION MAKING: Evolving/moderate complexity  EVALUATION COMPLEXITY: Moderate  PLAN:  PT FREQUENCY: 2x/week  PT DURATION: 8 weeks  PLANNED INTERVENTIONS: 97164- PT Re-evaluation, 97110-Therapeutic exercises, 97530- Therapeutic activity, 97112- Neuromuscular re-education, 97535- Self Care, 02859- Manual therapy, [phone]- Gait training, Patient/Family education, Balance training, Stair training, Vestibular training, and DME instructions  PLAN FOR NEXT SESSION:  Work with use of rollator (pt has one  at home). Continue to educate on freezing strategies. Postural control, lateral weight shifting, forward and retro stepping   For aquatic PT - work on balance, giving pt HEP that he can perform in his own pool    Roxanna [place], PT 04/25/2024, 8:03 PM

## 2024-04-26 ENCOUNTER — Ambulatory Visit: Admitting: Occupational Therapy

## 2024-04-26 ENCOUNTER — Encounter: Admitting: Occupational Therapy

## 2024-04-26 ENCOUNTER — Ambulatory Visit: Admitting: Physical Therapy

## 2024-04-26 DIAGNOSIS — R29818 Other symptoms and signs involving the nervous system: Secondary | ICD-10-CM | POA: Diagnosis not present

## 2024-04-26 DIAGNOSIS — M6281 Muscle weakness (generalized): Secondary | ICD-10-CM

## 2024-04-26 DIAGNOSIS — R278 Other lack of coordination: Secondary | ICD-10-CM

## 2024-04-26 DIAGNOSIS — R2689 Other abnormalities of gait and mobility: Secondary | ICD-10-CM

## 2024-04-26 DIAGNOSIS — R2681 Unsteadiness on feet: Secondary | ICD-10-CM

## 2024-04-26 DIAGNOSIS — R29898 Other symptoms and signs involving the musculoskeletal system: Secondary | ICD-10-CM

## 2024-04-26 NOTE — Therapy (Unsigned)
 OUTPATIENT OCCUPATIONAL THERAPY PARKINSON'S TREATMENT  Patient Name: Timothy Shields MRN: [id number] DOB:1946-04-14, 78 y.o., male Today's Date: 04/26/2024  PCP: Janey Santos, MD  REFERRING PROVIDER: Morita, Hokuto, MD  END OF SESSION:  OT End of Session - 04/26/24 1404     Visit Number 3    Number of Visits 17   max number of visits possible   Date for OT Re-Evaluation 06/22/24    Authorization Type Medicare    OT Start Time 1409    OT Stop Time 1447    OT Time Calculation (min) 38 min    Activity Tolerance Patient tolerated treatment well    Behavior During Therapy WFL for tasks assessed/performed         Past Medical History:  Diagnosis Date   Cerebrovascular disease    Headache(784.0)    Stroke (HCC)    3   Past Surgical History:  Procedure Laterality Date   HERNIA REPAIR     HERNIA REPAIR     Twice in the 1970's   Patient Active Problem List   Diagnosis Date Noted   Erectile dysfunction 09/13/2013   Other generalized ischemic cerebrovascular disease 07/10/2013   Other and unspecified hyperlipidemia 07/10/2013   ONSET DATE: 03/01/2024 (Date of referral)  REFERRING DIAG: G20.A1 (ICD-10-CM) - Parkinson's disease without dyskinesia, without mention of fluctuations  THERAPY DIAG:  Muscle weakness (generalized)  Other symptoms and signs involving the nervous system  Other symptoms and signs involving the musculoskeletal system  Other lack of coordination  Rationale for Evaluation and Treatment: Rehabilitation  SUBJECTIVE:   SUBJECTIVE STATEMENT: He has noticed his handwriting is easier to read when he writes in print.   Pt accompanied by: self  PERTINENT HISTORY: Per Dr. Rosemarie: remote right PICA and left PCA infarcts in 1982 as well as left basal ganglia lacunar infarct in 2005 with mild residual right sided weakness with recent increase complaints of worsening gait difficulties likely multifactorial due to combination of old strokes, poststroke  parkinsonism and mild spinal stenosis and spondylosis. He also has mild cognitive impairment which appears quite stable     Patient has noticed increasing tremors in his left hand as well as some increase hypophonia and drooling.  He does have some difficulty at times getting up from the chair or wearing his clothes.  He was seen at [facility] neurology for consultation and started on Sinemet  25/100 mg 1-1/2 tablets 3 times daily. He continues to ambulate with a wheeled walker and feels his balance is poor. He has had a few falls in fact 9 in the last several months fortunately no major injury.   Had a driving evaluation from [facility] and was told not to drive. Has had 9 falls in the past year. Goes to a Systems [employer] 3x a week.   PRECAUTIONS: Fall  WEIGHT BEARING RESTRICTIONS: No  PAIN:  Are you having pain? No  FALLS: Has patient fallen in last 6 months? Yes. Number of falls 9  LIVING ENVIRONMENT: Lives with: lives with their spouse, daughter lives close by and son is moving closer  Lives in: House/apartment - will soon be downsizing to an apartment  Stairs: Yes: External: 4 huge stone steps steps; on right going up Has following equipment at home: Single point cane, Walker - 4 wheeled, and Grab bars  PLOF: Independent with household mobility with device, Independent with community mobility with device, Vocation/Vocational requirements: Currently still working, Economist of a Physiological scientist, and Leisure: collects books and baseball cards (has  been collecting for 56 years); not driving (as of a few weeks ago)   PATIENT GOALS: PD management  OBJECTIVE:  Note: Objective measures were completed at Evaluation unless otherwise noted.  HAND DOMINANCE: Right  ADLs: Overall ADLs: mostly mod I; requires assistance with buttons, especially with sleeve cuffs  IADLs: Handwriting: 90% legible, Severe micrographia, and pt reports he has always written small, but it has gotten even smaller. It is  better if he thinks about what he is going to write and takes his time. There are times that he writes something down and then is unable to determine what he wrote.   MOBILITY STATUS: See PT notes  POSTURE COMMENTS:  rounded shoulders, forward head, and weight shift left, head and trunk tilted to the L   ACTIVITY TOLERANCE: Activity tolerance: good to fair  FUNCTIONAL OUTCOME MEASURES: Fastening/unfastening 3 buttons: TBD Physical performance test: PPT#2 (simulated eating) TBD & PPT#4 (donning/doffing jacket): TBD  COORDINATION: 9 Hole Peg test: Right: TBD sec; Left: TBD sec  UE ROM:  TBD  MUSCLE TONE: TBD  COGNITION: Overall cognitive status: History of cognitive impairments - at baseline  OBSERVATIONS: resting LUE tremor; impulsive; mild bradykinesia                                                                                                                   TREATMENT:  - Therapeutic activities completed for duration as noted below including: OT educated pt on compensatory strategies for micrographia related to Parkinson's disease. Education was provided using the handout as noted in pt instructions titled "Suggestions for Handwriting Changes" (adapted from [email or web address]). Topics reviewed included: Causes of micrographia (slowed movement, stiffness, loss of automaticity) Strategies to improve legibility (e.g., deliberate letter formation, use of pen grips, lined paper, printing vs. cursive) Environmental and behavioral modifications (e.g., rest breaks, avoiding fatigue, using ballpoint pens) Therapeutic exercises (e.g., PWR! Hands/flicks) Pt practiced writing using lined paper and a pen with a built-up grip. Demonstrated improved letter size and spacing with verbal cues (slower pacing) and visual feedback. Pt encouraged to write printed, capital letters for best legibility and consistency with size.    - Therapeutic exercises completed for duration as noted below  including: OT reviewed PWR! Hands. Education provided on the importance of completion and why these exercises are important given PD progression.   PATIENT EDUCATION: Education details: writing; PWR! Hands Person educated: Patient Education method: Explanation, Demonstration, Verbal cues, and Handouts Education comprehension: verbalized understanding, returned demonstration, verbal cues required, and needs further education  HOME EXERCISE PROGRAM: 03/23/2024: handwriting samples 04/19/2024: PWR! Hands 04/26/2024: handwriting changes  GOALS:  SHORT TERM GOALS: Target date: 05/17/2024    Pt will be independent with PD specific HEP.  Baseline: not yet initiated Goal status: IN PROGRESS  2.  Pt will verbalize understanding of adapted strategies to maximize safety and independence with ADLs/IADLs.  Baseline: not yet initiated Goal status: INITIAL  3.  Will set appropriate coordination based goal. Baseline: not yet initiated Goal status: INITIAL  LONG TERM  GOALS: Target date: 06/22/24    Pt will verbalize understanding of ways to prevent future PD related complications and PD community resources.  Baseline: not yet initiated Goal status: INITIAL  2.  Pt will write a short paragraph with no significant decrease in size and maintain 100% legibility.  Baseline: 90% legibility severe micrographia Goal status: INITIAL  3.  Pt will verbalize understanding of ways to keep thinking skills sharp and ways to compensate for STM changes in the future.  Baseline: not yet initiated Goal status: INITIAL  4.  Pt will be able to place at least 5 additional blocks using R hand with completion of Box and Blocks test.  Baseline: 04/19/2024: Right - 42 blocks; Left - 49 blocks Goal status: INITIAL   5.  Pt will demonstrate improved ease with fastening buttons as evidenced by decreasing 3 button/unbutton time by at least 5 seconds or more as appropriate.  Baseline: 04/19/2024: 42 seconds Goal  status: INITIAL  6.  Pt will demonstrate improved fine motor coordination for ADLs as evidenced by decreasing 9 hole peg test score for each hand by at least 5 secs or more as appropriate.  Baseline: 04/19/2024: Right - 33 seconds; Left - 38 seconds Goal status: INITIAL   ASSESSMENT:  CLINICAL IMPRESSION: Patient demonstrates good return of strategies this visit. Recommend he utilize printed, capital letters for best legibility as well as slower pacing. Continue to progress towards goals as written.   PERFORMANCE DEFICITS: in functional skills including ADLs, IADLs, coordination, ROM, Fine motor control, Gross motor control, mobility, balance, decreased knowledge of precautions, decreased knowledge of use of DME, and UE functional use.   IMPAIRMENTS: are limiting patient from ADLs, IADLs, rest and sleep, leisure, and social participation.   COMORBIDITIES:  may have co-morbidities  that affects occupational performance. Patient will benefit from skilled OT to address above impairments and improve overall function.  REHAB POTENTIAL: Good  PLAN:  OT FREQUENCY: 2x/week  OT DURATION: 8 weeks  PLANNED INTERVENTIONS: 97168 OT Re-evaluation, 97535 self care/ADL training, 02889 therapeutic exercise, 97530 therapeutic activity, 97112 neuromuscular re-education, functional mobility training, coping strategies training, patient/family education, and DME and/or AE instructions  RECOMMENDED OTHER SERVICES: N/A for this visit  CONSULTED AND AGREED WITH PLAN OF CARE: Patient  PLAN FOR NEXT SESSION: encourage pt to speak LOUDLY; review PWR! Hands; Initiate PD coordination HEP   Jocelyn CHRISTELLA Bottom, OT 04/26/2024, 2:54 PM

## 2024-04-26 NOTE — Therapy (Signed)
 OUTPATIENT PHYSICAL THERAPY NEURO TREATMENT   Patient Name: Timothy Shields MRN: [id number] DOB:1946/02/05, 78 y.o., male Today's Date: 04/26/2024   PCP: Timothy Santos, MD  REFERRING PROVIDER: Rosemarie Eather RAMAN, MD   END OF SESSION:  PT End of Session - 04/26/24 1449     Visit Number 12    Number of Visits 17    Date for PT Re-Evaluation 05/05/24    Authorization Type MEDICARE PART A AND B    PT Start Time 1449    PT Stop Time 1528    PT Time Calculation (min) 39 min    Equipment Utilized During Treatment Gait belt    Activity Tolerance Patient tolerated treatment well    Behavior During Therapy WFL for tasks assessed/performed              Past Medical History:  Diagnosis Date   Cerebrovascular disease    Headache(784.0)    Stroke (HCC)    3   Past Surgical History:  Procedure Laterality Date   HERNIA REPAIR     HERNIA REPAIR     Twice in the 1970's   Patient Active Problem List   Diagnosis Date Noted   Erectile dysfunction 09/13/2013   Other generalized ischemic cerebrovascular disease 07/10/2013   Other and unspecified hyperlipidemia 07/10/2013    ONSET DATE: 02/14/2024  REFERRING DIAG: R26.9 (ICD-10-CM) - Gait disorder   THERAPY DIAG:  Other abnormalities of gait and mobility  Unsteadiness on feet  Muscle weakness (generalized)  Rationale for Evaluation and Treatment: Rehabilitation  SUBJECTIVE:                                                                                                                                                                                             SUBJECTIVE STATEMENT: Pt presents w/rollator, handoff w/OT. Pt reports he enjoyed pool yesterday. States he is doing fantastic, showed off to therapist how he can walk without AD and no squeaking. Hip is feeling alright, feels better after warming up.   Pt accompanied by: daughter  PERTINENT HISTORY:    Per Dr. Rosemarie: remote right PICA and left PCA  infarcts in 1982 as well as left basal ganglia lacunar infarct in 2005 with mild residual right sided weakness with recent increase complaints of worsening gait difficulties likely multifactorial due to combination of old strokes, poststroke parkinsonism and mild spinal stenosis and spondylosis. He also has mild cognitive impairment which appears quite stable    Patient has noticed increasing tremors in his left hand as well as some increase hypophonia and drooling.  He does have some difficulty at times getting up from  the chair or wearing his clothes.  He was seen at [facility] neurology for consultation and started on Sinemet  25/100 mg 1-1/2 tablets 3 times daily. He continues to ambulate with a wheeled walker and feels his balance is poor. He has had a few falls in fact 9 in the last several months fortunately no major injury.   PAIN:  Are you having pain? Yes: NPRS scale: right now it is 0/10  Pain location: R hip Pain description: achy    PRECAUTIONS: Fall  FALLS: Has patient fallen in last 6 months? Reports 9 falls in the past year, reports most falls occur when going up on his toes   LIVING ENVIRONMENT: Lives with: lives with their spouse, daughter lives close by and son is moving closer  Lives in: House/apartment - will soon be downsizing to an apartment  Stairs: Yes: External: 4 huge stone steps steps; on right going up Has following equipment at home: Single point cane, Walker - 4 wheeled, and Grab bars  PLOF: Independent with household mobility with device, Independent with community mobility with device, Vocation/Vocational requirements: Currently still working, Theatre manager a Physiological scientist, and Leisure: collects books and baseball cards (has been collecting for 56 years)  No driving   PATIENT GOALS: I don't know, everyone just tells me I should come here   OBJECTIVE:  Note: Objective measures were completed at Evaluation unless otherwise noted.  DIAGNOSTIC FINDINGS: MRI  brain 09/2021: IMPRESSION: This MRI of the brain with and without contrast shows the following: 1.   Remote left posterior cerebral artery infarction, remote right posterior inferior cerebellar artery infarction and remote lacunar infarction in the left posterior internal capsule.   2.   The brain was otherwise normal for age. 3.   No acute findings.  Normal enhancement pattern.    COGNITION: Overall cognitive status: Within functional limits for tasks assessed   SENSATION: Pt reports some numbness/tingling in RLE, doesn't happen all the time   COORDINATION: Heel to shin: WNL  OBSERVATIONS: Resting LUE tremor  Bilateral ankle swelling (as appt with PCP next month and going to discuss)   POSTURE: rounded shoulders, forward head, and weight shift left, head and trunk tilted to the L    LOWER EXTREMITY MMT:    MMT Right Eval Left Eval  Hip flexion 5 5  Hip extension    Hip abduction    Hip adduction    Hip internal rotation    Hip external rotation    Knee flexion 5 5  Knee extension 5 4+  Ankle dorsiflexion 5 5  Ankle plantarflexion    Ankle inversion    Ankle eversion    (Blank rows = not tested)  BED MOBILITY:   Pt reports no difficulties   TRANSFERS: Sit to stand: SBA  Assistive device utilized: None     Stand to sit: SBA  Assistive device utilized: None      Initial cues for pt to scoot out to the edge before standing with no UE support    FUNCTIONAL TESTS:  5 times sit to stand: 12.7 seconds with no UE support, wide BOS, decr forward lean with incr weight shifted onto heels  10 meter walk test: 11.3 seconds with SPC = 2.90 ft/sec   VITALS  There were no vitals filed for this visit.  TREATMENT:   Self-care/home management  Discussed PT POC for next week and Timothy Shields reports she can see pt on 8/27 at 1:15 for aquatics, but  pt unsure if he can make this. Pt to check his schedule and call clinic by tomorrow. Pt accidentally scheduled for 3 PT sessions next week and with addition of aquatic session, will be 4. Cancelled one of the land PT visits and pt informed that if he goes to aquatics on 8/27, will need to cancel PT on 8/28. If he cannot go to pool, we will keep session on 8/28. Pt verbalized understanding.   Ther Act  SciFit multi-peaks level 15.0 for 8 minutes using BUE/BLEs for neural priming for reciprocal movement, dynamic cardiovascular warmup and increased amplitude of stepping. RPE of 7/10 following activity.    NMR  Alt fwd step w/rotation and 2kg ball throw/catch to rebounder, x8 reps per leg per side for improved step length/clearance, truncal mobility and reactive balance strategies. CGA-min A throughout for anterior LOB correction. Pt w/increased difficulty rotating to L side. Practiced hitting balls from a pitching machine w/badminton racket, x15 balls, for improved reactive/anticipatory balance strategies. Pt able to reach towards ground and hit ~5 balls without LOB, which surprised pt. Pt inquiring if he could reach out to his tennis coach and ask to use the pitching machine at court as he would love to return to tennis. Encouraged pt to ask but also recommended he only perform if he has someone to guard him.    PATIENT EDUCATION: Education details: Continue HEP, plan for PT next week  Person educated: Patient Education method: Medical illustrator Education comprehension: verbalized understanding, returned demonstration, and needs further education  HOME EXERCISE PROGRAM: Seated PWR moves  Access Code: [email or web address] URL: [email or web address] Date: 03/22/2024 Prepared by: Marlon Faduma Cho  Exercises - Standing ITB Stretch  - 1 x daily - 7 x weekly - 3 sets - 30-60 seconds hold  GOALS: Goals reviewed with patient? Yes  SHORT TERM GOALS: Target date: 04/03/2024  Pt will be  independent with initial HEP for strength, gait, balance in order to build upon functional gains made in therapy. Baseline: Goal status: MET  2.  FOG-Q to be assessed with LTG written. Baseline:  Goal status: MET  3.  TUG/cog TUG to be assessed with LTG written.  Baseline:  Goal status: MET  4.  Pt will be able to demo/verbalize understanding of freezing strategies.  Baseline:  Goal status: INITIAL  5.  Push and release to be assessed with LTG written.  Baseline: assessed on 7/17 Goal status: MET   LONG TERM GOALS: Target date: 05/05/2024 (updated to match cert date)  Pt will be independent with final land and aquatic HEP for strength, gait, balance in order to build upon functional gains made in therapy. Baseline:  Goal status: INITIAL  2.  Pt will correct forward balance on push and release test in 3 or fewer steps for improved reactive balance strategies and fall prevention  Baseline: would fall if not caught (7/17) Goal status: INITIAL  3.  Pt will improve TUG to less than or equal to 11 seconds w/LRAD for improved functional mobility and decreased fall risk.  Baseline: 13.53s no AD (7/10) Goal status: REVISED  4.  Pt will score </= 12/24 on FOG-Q for improved implementation of freezing strategies and reduced fall risk  Baseline: 14/24 Goal status: INITIAL  5.  Pt will verbalize understanding of local Parkinson's disease resources, including options for continue community fitness.  Baseline:  Goal status: INITIAL  6.  Pt will improve gait speed with LRAD to at least 3.3 ft/sec in order to demo improved community mobility.   Baseline: 11.3 seconds with SPC = 2.90 ft/sec Goal status: INITIAL  ASSESSMENT:  CLINICAL IMPRESSION: Emphasis of skilled PT session on reactive balance strategies, truncal mobility and fwd/retro stepping. Pt able to ambulate with large steps throughout session without AD and reports feeling amazing. Pt most challenged w/rotation to L side,  losing his balance anteriorly but requiring no more than min A to correct due to adequate stepping strategy. Pt able to reach towards ground and hit a ball with racket without instability and would like to return to tennis to work on coordination, so informed pt he may try if he has someone with him to guard. Cont with POC.      OBJECTIVE IMPAIRMENTS: Abnormal gait, decreased activity tolerance, decreased balance, decreased coordination, decreased knowledge of condition, decreased knowledge of use of DME, decreased mobility, difficulty walking, decreased strength, decreased safety awareness, impaired flexibility, impaired sensation, impaired UE functional use, and postural dysfunction.   ACTIVITY LIMITATIONS: stairs, transfers, and locomotion level  PARTICIPATION LIMITATIONS: driving, shopping, and community activity  PERSONAL FACTORS: Age, Behavior pattern, Past/current experiences, Time since onset of injury/illness/exacerbation, and 3+ comorbidities:  Vascular Parkinsonism/Idiopathic PD, mild cognitive impairment, mild spinal stenosis and spondylosis, remote right PICA and left PCA infarcts in 1982 as well as left basal ganglia lacunar infarct in 2005 with mild residual right sided weakness  are also affecting patient's functional outcome.   REHAB POTENTIAL: Good  CLINICAL DECISION MAKING: Evolving/moderate complexity  EVALUATION COMPLEXITY: Moderate  PLAN:  PT FREQUENCY: 2x/week  PT DURATION: 8 weeks  PLANNED INTERVENTIONS: 97164- PT Re-evaluation, 97110-Therapeutic exercises, 97530- Therapeutic activity, 97112- Neuromuscular re-education, 97535- Self Care, 02859- Manual therapy, [phone]- Gait training, Patient/Family education, Balance training, Stair training, Vestibular training, and DME instructions  PLAN FOR NEXT SESSION: Can he do pool on 8/27? If he can, cancel PT on 8/28.   Work with use of rollator (pt has one at home). Continue to educate on freezing strategies. Postural  control, lateral weight shifting, forward and retro stepping, reactive balance strategies, pt liked using pitching machine   For aquatic PT - work on balance, giving pt HEP that he can perform in his own pool    Elpidia Karn E Janeya Deyo, PT, DPT 04/26/2024, 3:29 PM

## 2024-04-26 NOTE — Patient Instructions (Signed)
 Suggestions for Handwriting Changes Many people with Parkinson's notice changes in their handwriting.  Handwriting often becomes small and cramped, and can become more difficult to control when writing for longer periods of time.  This handwriting change is called micrographia.  Why does micrographia occur?  Parkinson's can cause slowing of movement, and feelings of muscle stiffness in the hands and fingers.  Loss of automatic motion also affects the easy, flowing motion of handwriting.  This can impact even simple writing tasks such as signing your name or writing a shopping list.  Attempts to write quickly without thinking about forming each letter contributes to small, cramped handwriting, and may cause the hand to develop a feeling of tightness.  How can I make writing easier?  Make a deliberate effort to form each letter.  This can be hard to do at first, but is very effective in improving size and legibility of handwriting.  Use a pen grip (round or triangular shaped rubber or foam cylinders available at stationery stores or where writing materials are found) or a larger size pen to keep your hand more relaxed.  Try printing rather than writing in a cursive style. Printing causes you to pause briefly between each letter, keeping writing more legible.   Using lined paper may provide a "visual target" to keep all letters big when writing.   A ballpoint pen typically works better than felt tip or "rolling writer"/gel styles.  Rest your hand if it begins to feel "tight".  Pause briefly when you see your handwriting becoming smaller.  Avoid hurrying or trying to write long passages if you are feeling stressed or fatigued.  Practice helps!  Remind yourself to slow down, aim big, and pause often!  Perform "flicks"/PWR! Hands if your hand feels tight, your writing gets smaller, before you start writing, or if tremors increase.  Involving your team: An occupational therapist can provide  assessment and individual recommendations for improvement of your handwriting.  This handout was adapted from [email or web address] [employer]

## 2024-04-30 ENCOUNTER — Ambulatory Visit: Admitting: Physical Therapy

## 2024-05-01 ENCOUNTER — Encounter: Payer: Self-pay | Admitting: Physical Therapy

## 2024-05-01 ENCOUNTER — Ambulatory Visit: Admitting: Physical Therapy

## 2024-05-01 ENCOUNTER — Ambulatory Visit: Admitting: Occupational Therapy

## 2024-05-01 DIAGNOSIS — R293 Abnormal posture: Secondary | ICD-10-CM

## 2024-05-01 DIAGNOSIS — R29898 Other symptoms and signs involving the musculoskeletal system: Secondary | ICD-10-CM

## 2024-05-01 DIAGNOSIS — M6281 Muscle weakness (generalized): Secondary | ICD-10-CM

## 2024-05-01 DIAGNOSIS — R2681 Unsteadiness on feet: Secondary | ICD-10-CM | POA: Diagnosis not present

## 2024-05-01 DIAGNOSIS — R29818 Other symptoms and signs involving the nervous system: Secondary | ICD-10-CM

## 2024-05-01 DIAGNOSIS — R278 Other lack of coordination: Secondary | ICD-10-CM | POA: Diagnosis not present

## 2024-05-01 DIAGNOSIS — R2689 Other abnormalities of gait and mobility: Secondary | ICD-10-CM | POA: Diagnosis not present

## 2024-05-01 NOTE — Therapy (Signed)
 OUTPATIENT OCCUPATIONAL THERAPY PARKINSON'S TREATMENT  Patient Name: Timothy Shields MRN: [id number] DOB:1946/07/31, 78 y.o., male Today's Date: 05/01/2024  PCP: Janey Santos, MD  REFERRING PROVIDER: Morita, Hokuto, MD  END OF SESSION:  OT End of Session - 05/01/24 1458     Visit Number 4    Number of Visits 17   max number of visits possible   Date for OT Re-Evaluation 06/22/24    Authorization Type Medicare    OT Start Time 1453    OT Stop Time 1535    OT Time Calculation (min) 42 min    Activity Tolerance Patient tolerated treatment well    Behavior During Therapy WFL for tasks assessed/performed         Past Medical History:  Diagnosis Date   Cerebrovascular disease    Headache(784.0)    Stroke (HCC)    3   Past Surgical History:  Procedure Laterality Date   HERNIA REPAIR     HERNIA REPAIR     Twice in the 1970's   Patient Active Problem List   Diagnosis Date Noted   Erectile dysfunction 09/13/2013   Other generalized ischemic cerebrovascular disease 07/10/2013   Other and unspecified hyperlipidemia 07/10/2013   ONSET DATE: 03/01/2024 (Date of referral)  REFERRING DIAG: G20.A1 (ICD-10-CM) - Parkinson's disease without dyskinesia, without mention of fluctuations  THERAPY DIAG:  Muscle weakness (generalized)  Other symptoms and signs involving the nervous system  Other symptoms and signs involving the musculoskeletal system  Other lack of coordination  Unsteadiness on feet  Abnormal posture  Rationale for Evaluation and Treatment: Rehabilitation  SUBJECTIVE:   SUBJECTIVE STATEMENT: My Rt hip hurts in the morning but it works its way out.    Pt accompanied by: self  PERTINENT HISTORY: Per Dr. Rosemarie: remote right PICA and left PCA infarcts in 1982 as well as left basal ganglia lacunar infarct in 2005 with mild residual right sided weakness with recent increase complaints of worsening gait difficulties likely multifactorial due to combination  of old strokes, poststroke parkinsonism and mild spinal stenosis and spondylosis. He also has mild cognitive impairment which appears quite stable     Patient has noticed increasing tremors in his left hand as well as some increase hypophonia and drooling.  He does have some difficulty at times getting up from the chair or wearing his clothes.  He was seen at [facility] neurology for consultation and started on Sinemet  25/100 mg 1-1/2 tablets 3 times daily. He continues to ambulate with a wheeled walker and feels his balance is poor. He has had a few falls in fact 9 in the last several months fortunately no major injury.   Had a driving evaluation from [facility] and was told not to drive. Has had 9 falls in the past year. Goes to a Systems [employer] 3x a week.   PRECAUTIONS: Fall  WEIGHT BEARING RESTRICTIONS: No  PAIN:  Are you having pain? No  FALLS: Has patient fallen in last 6 months? Yes. Number of falls 9  LIVING ENVIRONMENT: Lives with: lives with their spouse, daughter lives close by and son is moving closer  Lives in: House/apartment - will soon be downsizing to an apartment  Stairs: Yes: External: 4 huge stone steps steps; on right going up Has following equipment at home: Single point cane, Walker - 4 wheeled, and Grab bars  PLOF: Independent with household mobility with device, Independent with community mobility with device, Vocation/Vocational requirements: Currently still working, Economist of a Physiological scientist, and  Leisure: collects books and baseball cards (has been collecting for 56 years); not driving (as of a few weeks ago)   PATIENT GOALS: PD management  OBJECTIVE:  Note: Objective measures were completed at Evaluation unless otherwise noted.  HAND DOMINANCE: Right  ADLs: Overall ADLs: mostly mod I; requires assistance with buttons, especially with sleeve cuffs  IADLs: Handwriting: 90% legible, Severe micrographia, and pt reports he has always written small, but it has  gotten even smaller. It is better if he thinks about what he is going to write and takes his time. There are times that he writes something down and then is unable to determine what he wrote.   MOBILITY STATUS: See PT notes  POSTURE COMMENTS:  rounded shoulders, forward head, and weight shift left, head and trunk tilted to the L   ACTIVITY TOLERANCE: Activity tolerance: good to fair  FUNCTIONAL OUTCOME MEASURES: Fastening/unfastening 3 buttons: TBD Physical performance test: PPT#2 (simulated eating) TBD & PPT#4 (donning/doffing jacket): TBD  COORDINATION: 9 Hole Peg test: Right: TBD sec; Left: TBD sec  UE ROM:  TBD  MUSCLE TONE: TBD  COGNITION: Overall cognitive status: History of cognitive impairments - at baseline  OBSERVATIONS: resting LUE tremor; impulsive; mild bradykinesia                                                                                                                   TREATMENT:   Reviewed PWR! Hands - pt return demo of each Discussed PD medications and encouraged pt to talk with his neurologist if the extended release carbidopa  levodopa  are not working as well during the day.  Discussed PD non motor symptoms (mood, digestion, sleep patterns, etc) and how the combination of proper medication and exercise can also help with this.  Pt issued coordination HEP - see pt instructions for details. Pt practiced each w/ cues for large movements.  Pt also issued info on POP group and upcoming PD symposium   PATIENT EDUCATION: Education details: writing; PWR! Hands Person educated: Patient Education method: Explanation, Demonstration, Verbal cues, and Handouts Education comprehension: verbalized understanding, returned demonstration, verbal cues required, and needs further education  HOME EXERCISE PROGRAM: 03/23/2024: handwriting samples 04/19/2024: PWR! Hands 04/26/2024: handwriting changes  GOALS:  SHORT TERM GOALS: Target date: 05/17/2024    Pt will be  independent with PD specific HEP.  Baseline: not yet initiated Goal status: IN PROGRESS  2.  Pt will verbalize understanding of adapted strategies to maximize safety and independence with ADLs/IADLs.  Baseline: not yet initiated Goal status: INITIAL  3.  Will set appropriate coordination based goal. Baseline: not yet initiated Goal status: INITIAL  LONG TERM GOALS: Target date: 06/22/24    Pt will verbalize understanding of ways to prevent future PD related complications and PD community resources.  Baseline: not yet initiated Goal status: INITIAL  2.  Pt will write a short paragraph with no significant decrease in size and maintain 100% legibility.  Baseline: 90% legibility severe micrographia Goal status: INITIAL  3.  Pt  will verbalize understanding of ways to keep thinking skills sharp and ways to compensate for STM changes in the future.  Baseline: not yet initiated Goal status: INITIAL  4.  Pt will be able to place at least 5 additional blocks using R hand with completion of Box and Blocks test.  Baseline: 04/19/2024: Right - 42 blocks; Left - 49 blocks Goal status: INITIAL   5.  Pt will demonstrate improved ease with fastening buttons as evidenced by decreasing 3 button/unbutton time by at least 5 seconds or more as appropriate.  Baseline: 04/19/2024: 42 seconds Goal status: INITIAL  6.  Pt will demonstrate improved fine motor coordination for ADLs as evidenced by decreasing 9 hole peg test score for each hand by at least 5 secs or more as appropriate.  Baseline: 04/19/2024: Right - 33 seconds; Left - 38 seconds Goal status: INITIAL   ASSESSMENT:  CLINICAL IMPRESSION: Patient demonstrates good return of HEP this visit. Encouraged pt to be more social which can help boost mood and cognition with PD.  PERFORMANCE DEFICITS: in functional skills including ADLs, IADLs, coordination, ROM, Fine motor control, Gross motor control, mobility, balance, decreased knowledge  of precautions, decreased knowledge of use of DME, and UE functional use.   IMPAIRMENTS: are limiting patient from ADLs, IADLs, rest and sleep, leisure, and social participation.   COMORBIDITIES:  may have co-morbidities  that affects occupational performance. Patient will benefit from skilled OT to address above impairments and improve overall function.  REHAB POTENTIAL: Good  PLAN:  OT FREQUENCY: 2x/week  OT DURATION: 8 weeks  PLANNED INTERVENTIONS: 97168 OT Re-evaluation, 97535 self care/ADL training, 02889 therapeutic exercise, 97530 therapeutic activity, 97112 neuromuscular re-education, functional mobility training, coping strategies training, patient/family education, and DME and/or AE instructions  RECOMMENDED OTHER SERVICES: N/A for this visit  CONSULTED AND AGREED WITH PLAN OF CARE: Patient  PLAN FOR NEXT SESSION: encourage pt to speak LOUDLY; PWR! Sitting,  Assess objective measures prn and update goals (PPT #2 and #4)    Burnard JINNY Roads, OT 05/01/2024, 2:59 PM

## 2024-05-01 NOTE — Patient Instructions (Signed)
 Coordination Exercises  Perform the following exercises for 15 minutes 1 times per day. Perform with both hand(s). Perform using big movements.  Flipping Cards: Place deck of cards on the table. Flip cards over by opening your hand big to grasp and then turn your palm up big. Deal cards: Hold 1/2 or whole deck in your hand. Use thumb to push card off top of deck with one big push. Rotate ball with fingertips: Pick up with fingers/thumb and move as much as you can with each turn/movement (clockwise and counter-clockwise). Toss ball in the air and catch with the same hand: Toss big/high. Rotate 2 golf balls in your hand: Both directions. Pick up 5 coins one at a time and hold in palm. Then, move coins from palm to fingertips one at time and place in coin bank/container or stack. Practice writing: Slow down, write big, and focus on forming each letter.

## 2024-05-01 NOTE — Therapy (Signed)
 OUTPATIENT PHYSICAL THERAPY NEURO TREATMENT/RE-CERT   Patient Name: Timothy Shields MRN: [id number] DOB:[birth year], 78 y.o., male Today's Date: 05/02/2024   PCP: Janey Santos, MD  REFERRING PROVIDER: Rosemarie Eather RAMAN, MD   END OF SESSION:  PT End of Session - 05/01/24 1538     Visit Number 13    Number of Visits 18    Date for PT Re-Evaluation 05/31/24    Authorization Type MEDICARE PART A AND B    PT Start Time 1535   handoff from OT   PT Stop Time 1615    PT Time Calculation (min) 40 min    Equipment Utilized During Treatment Gait belt    Activity Tolerance Patient tolerated treatment well    Behavior During Therapy WFL for tasks assessed/performed              Past Medical History:  Diagnosis Date   Cerebrovascular disease    Headache(784.0)    Stroke (HCC)    3   Past Surgical History:  Procedure Laterality Date   HERNIA REPAIR     HERNIA REPAIR     Twice in the 1970's   Patient Active Problem List   Diagnosis Date Noted   Erectile dysfunction 09/13/2013   Other generalized ischemic cerebrovascular disease 07/10/2013   Other and unspecified hyperlipidemia 07/10/2013    ONSET DATE: 02/14/2024  REFERRING DIAG: R26.9 (ICD-10-CM) - Gait disorder   THERAPY DIAG:  Muscle weakness (generalized)  Other symptoms and signs involving the nervous system  Unsteadiness on feet  Abnormal posture  Rationale for Evaluation and Treatment: Rehabilitation  SUBJECTIVE:                                                                                                                                                                                             SUBJECTIVE STATEMENT: Pt presents w/rollator, handoff w/OT. Showing this therapist how he is walking with no AD. Will be seeing Elvie in the pool tomorrow and thinking about joining a pool. Thinks his improvements are due to a combo of exercise and medication.   Pt accompanied by: Self  PERTINENT  HISTORY:    Per Dr. Rosemarie: remote right PICA and left PCA infarcts in 1982 as well as left basal ganglia lacunar infarct in 2005 with mild residual right sided weakness with recent increase complaints of worsening gait difficulties likely multifactorial due to combination of old strokes, poststroke parkinsonism and mild spinal stenosis and spondylosis. He also has mild cognitive impairment which appears quite stable    Patient has noticed increasing tremors in his left hand as well as some increase hypophonia and  drooling.  He does have some difficulty at times getting up from the chair or wearing his clothes.  He was seen at [facility] neurology for consultation and started on Sinemet  25/100 mg 1-1/2 tablets 3 times daily. He continues to ambulate with a wheeled walker and feels his balance is poor. He has had a few falls in fact 9 in the last several months fortunately no major injury.   PAIN:  Are you having pain? Yes: NPRS scale: right now it is 0/10  Pain location: R hip Pain description: achy    PRECAUTIONS: Fall  FALLS: Has patient fallen in last 6 months? Reports 9 falls in the past year, reports most falls occur when going up on his toes   LIVING ENVIRONMENT: Lives with: lives with their spouse, daughter lives close by and son is moving closer  Lives in: House/apartment - will soon be downsizing to an apartment  Stairs: Yes: External: 4 huge stone steps steps; on right going up Has following equipment at home: Single point cane, Walker - 4 wheeled, and Grab bars  PLOF: Independent with household mobility with device, Independent with community mobility with device, Vocation/Vocational requirements: Currently still working, Theatre manager a Physiological scientist, and Leisure: collects books and baseball cards (has been collecting for 56 years)  No driving   PATIENT GOALS: I don't know, everyone just tells me I should come here   OBJECTIVE:  Note: Objective measures were completed at  Evaluation unless otherwise noted.  DIAGNOSTIC FINDINGS: MRI brain 09/2021: IMPRESSION: This MRI of the brain with and without contrast shows the following: 1.   Remote left posterior cerebral artery infarction, remote right posterior inferior cerebellar artery infarction and remote lacunar infarction in the left posterior internal capsule.   2.   The brain was otherwise normal for age. 3.   No acute findings.  Normal enhancement pattern.    COGNITION: Overall cognitive status: Within functional limits for tasks assessed   SENSATION: Pt reports some numbness/tingling in RLE, doesn't happen all the time   COORDINATION: Heel to shin: WNL  OBSERVATIONS: Resting LUE tremor  Bilateral ankle swelling (as appt with PCP next month and going to discuss)   POSTURE: rounded shoulders, forward head, and weight shift left, head and trunk tilted to the L    LOWER EXTREMITY MMT:    MMT Right Eval Left Eval  Hip flexion 5 5  Hip extension    Hip abduction    Hip adduction    Hip internal rotation    Hip external rotation    Knee flexion 5 5  Knee extension 5 4+  Ankle dorsiflexion 5 5  Ankle plantarflexion    Ankle inversion    Ankle eversion    (Blank rows = not tested)  BED MOBILITY:   Pt reports no difficulties   TRANSFERS: Sit to stand: SBA  Assistive device utilized: None     Stand to sit: SBA  Assistive device utilized: None      Initial cues for pt to scoot out to the edge before standing with no UE support    FUNCTIONAL TESTS:  5 times sit to stand: 12.7 seconds with no UE support, wide BOS, decr forward lean with incr weight shifted onto heels  10 meter walk test: 11.3 seconds with SPC = 2.90 ft/sec   VITALS  There were no vitals filed for this visit.  TREATMENT:   Therapeutic Activity/Self-Care:  Discussed POC going forwards -  will re-cert to cover a few more aquatic visits (as it took a while for pt to get scheduled for these) and 1 more land appt (was already scheduled, but pt would like to use this visit to review everything as did not have time during session today), and will D/C after last aquatic appt  Educated on PD screens for PT/OT/ST and that pt will get scheduled for these after he is D/C from PT, with pt in agreement with plan  Provided handout for different pools in the [place] area as pt wants to continue to work on his aquatic therapy exercises  Went over PD community resources with pt, with pt interested in [place] steady boxing and potentially the PWR moves class    Goal Assessment: Gait speed: 10.5 seconds with no AD = 3.12 ft/sec, 11.9 seconds with rollator = 2.76 ft/sec FOG-Q: 10/24   [facility] PT Assessment - 05/01/24 0001       Mini-BESTest   Compensatory Stepping Correction - Forward Normal: Recovers independently with a single, large step (second realignement is allowed).   1 step   Compensatory Stepping Correction - Backward Normal: Recovers independently with a single, large step   1 small step   Compensatory Stepping Correction - Left Lateral Normal: Recovers independently with 1 step (crossover or lateral OK)   1 crossover step   Compensatory Stepping Correction - Right Lateral Normal: Recovers independently with 1 step (crossover or lateral OK)   1 crossover step   Stepping Corredtion Lateral - lowest score 2      Timed Up and Go Test   Normal TUG (seconds) 7.4   with no AD          PATIENT EDUCATION: Education details: See self-care section above, results of goals and progress with therapy  Person educated: Patient Education method: Explanation, Demonstration, Verbal cues, and Handouts Education comprehension: verbalized understanding, returned demonstration, and needs further education  HOME EXERCISE PROGRAM: Seated PWR moves, Standing PWR moves   Access Code: [email or web address] URL:  [email or web address] Date: 03/22/2024 Prepared by: Marlon Plaster  Exercises - Standing ITB Stretch  - 1 x daily - 7 x weekly - 3 sets - 30-60 seconds hold  GOALS: Goals reviewed with patient? Yes  SHORT TERM GOALS: Target date: 04/03/2024  Pt will be independent with initial HEP for strength, gait, balance in order to build upon functional gains made in therapy. Baseline: Goal status: MET  2.  FOG-Q to be assessed with LTG written. Baseline:  Goal status: MET  3.  TUG/cog TUG to be assessed with LTG written.  Baseline:  Goal status: MET  4.  Pt will be able to demo/verbalize understanding of freezing strategies.  Baseline:  Goal status: INITIAL  5.  Push and release to be assessed with LTG written.  Baseline: assessed on 7/17 Goal status: MET   LONG TERM GOALS: Target date: 05/05/2024 (updated to match cert date)  Pt will be independent with final land and aquatic HEP for strength, gait, balance in order to build upon functional gains made in therapy. Baseline:  Goal status: ON-GOING  2.  Pt will correct forward balance on push and release test in 3 or fewer steps for improved reactive balance strategies and fall prevention  Baseline: would fall if not caught (7/17)  1-2 steps (8/26) Goal status: MET  3.  Pt will improve TUG to less than or equal to 11 seconds w/LRAD for improved  functional mobility and decreased fall risk.  Baseline: 13.53s no AD (7/10)  7.4 seconds (8/26) Goal status: MET  4.  Pt will score </= 12/24 on FOG-Q for improved implementation of freezing strategies and reduced fall risk  Baseline: 14/24  10/24 (8/26) Goal status: MET  5.  Pt will verbalize understanding of local Parkinson's disease resources, including options for continue community fitness.  Baseline: provided handout on 05/01/24 Goal status: MET  6.  Pt will improve gait speed with LRAD to at least 3.3 ft/sec in order to demo improved community mobility.    Baseline: 11.3 seconds with SPC = 2.90 ft/sec  10.5 seconds with no AD = 3.12 ft/sec (8/26) Goal status: NOT MET  ON-GOING LTG FOR RE-CERT:  LONG TERM GOALS: Target Date: 05/31/2024  Pt will be independent with final PD specific land and aquatic HEP for strength, gait, balance in order to build upon functional gains made in therapy. Baseline:  Goal status: ON-GOING  ASSESSMENT:  CLINICAL IMPRESSION: At start of session, pt showed PT his improved step length and foot clearance with no AD and reports significant improvements with his gait and walking with the conjunction of PT/medication.  Today's skilled session focused on LTG assessment. Pt has met LTGs #2 - 5. Pt with significant improvement in his stepping strategies for balance - able to maintain balance in all directions with 1-2 steps (previously took multiple steps or would have fallen). Pt with improvement on score of FOG-Q, indicating decr freezing of gait and improvement in TUG with no AD, decr pt's risk of falls. Pt did not meet LTG #6 in regards to gait speed. Pt has had an improvement in his gait speed and quality of gait, but not quite to goal level. Reviewed PD community resources with pt interested in participating in [place] steady classes. Pt has recently started aquatic therapy and is interested in joining a community pool. Pt will benefit from a few more aquatic sessions to continue working on strength, gait, balance and to establish an aquatic HEP. Pt has one more land appt scheduled to review HEP before D/C. Re-cert performed to cover a few more aquatic visits and 1 more land visit prior to D/C with pt in agreement with plan and to schedule PD screens in 6 months. LTGs updated/revised as appropriate.     OBJECTIVE IMPAIRMENTS: Abnormal gait, decreased activity tolerance, decreased balance, decreased coordination, decreased knowledge of condition, decreased knowledge of use of DME, decreased mobility, difficulty walking,  decreased strength, decreased safety awareness, impaired flexibility, impaired sensation, impaired UE functional use, and postural dysfunction.   ACTIVITY LIMITATIONS: stairs, transfers, and locomotion level  PARTICIPATION LIMITATIONS: driving, shopping, and community activity  PERSONAL FACTORS: Age, Behavior pattern, Past/current experiences, Time since onset of injury/illness/exacerbation, and 3+ comorbidities:  Vascular Parkinsonism/Idiopathic PD, mild cognitive impairment, mild spinal stenosis and spondylosis, remote right PICA and left PCA infarcts in 1982 as well as left basal ganglia lacunar infarct in 2005 with mild residual right sided weakness  are also affecting patient's functional outcome.   REHAB POTENTIAL: Good  CLINICAL DECISION MAKING: Evolving/moderate complexity  EVALUATION COMPLEXITY: Moderate  PLAN:  PT FREQUENCY: 2x/week  PT DURATION: 8 weeks - plus re-cert for 8-7k week for 4 weeks   PLANNED INTERVENTIONS: 97164- PT Re-evaluation, 97110-Therapeutic exercises, 97530- Therapeutic activity, 97112- Neuromuscular re-education, 97535- Self Care, [id number]- Manual therapy, [phone]- Gait training, Patient/Family education, Balance training, Stair training, Vestibular training, and DME instructions  PLAN FOR NEXT SESSION: For last land PT session -  Pt wants to know what specific exercises we would recommend that he can also keep working on with his trainer. Wants to review his HEP. Get him scheduled for PD screens in 6 months   For aquatic PT - work on balance, giving pt HEP that he can perform in his own pool, add about 2-3 more aquatic appts? And then D/C    Santina Trillo N Alaycia Eardley, PT, DPT 05/02/2024, 7:59 AM

## 2024-05-02 ENCOUNTER — Ambulatory Visit: Admitting: Physical Therapy

## 2024-05-02 DIAGNOSIS — M6281 Muscle weakness (generalized): Secondary | ICD-10-CM | POA: Diagnosis not present

## 2024-05-02 DIAGNOSIS — R2689 Other abnormalities of gait and mobility: Secondary | ICD-10-CM | POA: Diagnosis not present

## 2024-05-02 DIAGNOSIS — R29818 Other symptoms and signs involving the nervous system: Secondary | ICD-10-CM | POA: Diagnosis not present

## 2024-05-02 DIAGNOSIS — R2681 Unsteadiness on feet: Secondary | ICD-10-CM

## 2024-05-02 DIAGNOSIS — R278 Other lack of coordination: Secondary | ICD-10-CM | POA: Diagnosis not present

## 2024-05-02 DIAGNOSIS — R29898 Other symptoms and signs involving the musculoskeletal system: Secondary | ICD-10-CM | POA: Diagnosis not present

## 2024-05-03 ENCOUNTER — Ambulatory Visit: Admitting: Occupational Therapy

## 2024-05-03 ENCOUNTER — Ambulatory Visit: Payer: Self-pay | Admitting: Speech Pathology

## 2024-05-03 ENCOUNTER — Ambulatory Visit: Admitting: Physical Therapy

## 2024-05-03 ENCOUNTER — Encounter: Payer: Self-pay | Admitting: Physical Therapy

## 2024-05-03 DIAGNOSIS — R29818 Other symptoms and signs involving the nervous system: Secondary | ICD-10-CM | POA: Diagnosis not present

## 2024-05-03 DIAGNOSIS — R29898 Other symptoms and signs involving the musculoskeletal system: Secondary | ICD-10-CM | POA: Diagnosis not present

## 2024-05-03 DIAGNOSIS — R278 Other lack of coordination: Secondary | ICD-10-CM

## 2024-05-03 DIAGNOSIS — R471 Dysarthria and anarthria: Secondary | ICD-10-CM

## 2024-05-03 DIAGNOSIS — M6281 Muscle weakness (generalized): Secondary | ICD-10-CM | POA: Diagnosis not present

## 2024-05-03 DIAGNOSIS — R2689 Other abnormalities of gait and mobility: Secondary | ICD-10-CM | POA: Diagnosis not present

## 2024-05-03 DIAGNOSIS — R2681 Unsteadiness on feet: Secondary | ICD-10-CM | POA: Diagnosis not present

## 2024-05-03 NOTE — Patient Instructions (Signed)
 BUTTONING  Open hands big before fastening or unfastening each button. Use deliberate movements - angry buttons.  Button by using push-pull method - push button through hole with one hand and pull the fabric back with the other hand.   Unfasten by using pull-push method - pull the fabric back with one hand and push the button through with the other.      To DON jacket:   1) Wide stance 2) Hold facing tag out with hands shoulder width apart at sleeves 3) Swing around back big like a cape to RIGHT side 4) Punch RIGHT arm in as you continue to pull behind you with LEFT hand to LEFT 5) Punch LEFT arm in   To DOFF jacket:   1) Wide stance 2) Hold sides of jacket with big hands at belly button level 3 opt1) Throw arms behind you like PWR! Up 3 opt 1) Slide jacket down arms 4) Reach big behind you and grab cuff of sleeve BIG and yank off one hand, then the other

## 2024-05-03 NOTE — Therapy (Signed)
 OUTPATIENT PHYSICAL THERAPY NEURO/AQUATIC THERAPY TREATMENT   Patient Name: Timothy Shields MRN: [id number] DOB:September 30, 1945, 78 y.o., male Today's Date: 05/03/2024   PCP: Janey Santos, MD  REFERRING PROVIDER: Rosemarie Eather RAMAN, MD   END OF SESSION:  PT End of Session - 05/03/24 0804     Visit Number 14    Number of Visits 18    Date for PT Re-Evaluation 05/31/24    Authorization Type MEDICARE PART A AND B    PT Start Time 1325   pt arrived late   PT Stop Time 1405    PT Time Calculation (min) 40 min    Equipment Utilized During Treatment Other (comment)   barbells   Activity Tolerance Patient tolerated treatment well    Behavior During Therapy WFL for tasks assessed/performed             Past Medical History:  Diagnosis Date   Cerebrovascular disease    Headache(784.0)    Stroke (HCC)    3   Past Surgical History:  Procedure Laterality Date   HERNIA REPAIR     HERNIA REPAIR     Twice in the 1970's   Patient Active Problem List   Diagnosis Date Noted   Erectile dysfunction 09/13/2013   Other generalized ischemic cerebrovascular disease 07/10/2013   Other and unspecified hyperlipidemia 07/10/2013    ONSET DATE: 02/14/2024  REFERRING DIAG: R26.9 (ICD-10-CM) - Gait disorder   THERAPY DIAG:  Muscle weakness (generalized)  Unsteadiness on feet  Other abnormalities of gait and mobility  Rationale for Evaluation and Treatment: Rehabilitation  SUBJECTIVE:                                                                                                                                                                                             SUBJECTIVE STATEMENT: Pt reports he and wife had dinner last night with some friends - had a glass of wine at dinner; says when he got home he walked his dog (a small terrier) and she got out of her harness; he bent down to get her and was very unsteady; says he didn't really fall but was staggering a lot and was unable  to get back up.  Says his son helped him. Did not get hurt, feels fine today; says he even slept well last night.  Realizes his body can't handle it like it used to even though he had very little  Pt accompanied by:  self  PERTINENT HISTORY:    Per Dr. Rosemarie: remote right PICA and left PCA infarcts in 1982 as well as left basal ganglia lacunar infarct in 2005  with mild residual right sided weakness with recent increase complaints of worsening gait difficulties likely multifactorial due to combination of old strokes, poststroke parkinsonism and mild spinal stenosis and spondylosis. He also has mild cognitive impairment which appears quite stable    Patient has noticed increasing tremors in his left hand as well as some increase hypophonia and drooling.  He does have some difficulty at times getting up from the chair or wearing his clothes.  He was seen at [facility] neurology for consultation and started on Sinemet  25/100 mg 1-1/2 tablets 3 times daily. He continues to ambulate with a wheeled walker and feels his balance is poor. He has had a few falls in fact 9 in the last several months fortunately no major injury.   PAIN:  Are you having pain? Yes: NPRS scale: right now it is 0/10  Pain location: R hip Pain description: achy    PRECAUTIONS: Fall  FALLS: Has patient fallen in last 6 months? Reports 9 falls in the past year, reports most falls occur when going up on his toes   LIVING ENVIRONMENT: Lives with: lives with their spouse, daughter lives close by and son is moving closer  Lives in: House/apartment - will soon be downsizing to an apartment  Stairs: Yes: External: 4 huge stone steps steps; on right going up Has following equipment at home: Single point cane, Walker - 4 wheeled, and Grab bars  PLOF: Independent with household mobility with device, Independent with community mobility with device, Vocation/Vocational requirements: Currently still working, Theatre manager a Physiological scientist,  and Leisure: collects books and baseball cards (has been collecting for 56 years)  No driving   PATIENT GOALS: I don't know, everyone just tells me I should come here   OBJECTIVE:  Note: Objective measures were completed at Evaluation unless otherwise noted.  DIAGNOSTIC FINDINGS: MRI brain 09/2021: IMPRESSION: This MRI of the brain with and without contrast shows the following: 1.   Remote left posterior cerebral artery infarction, remote right posterior inferior cerebellar artery infarction and remote lacunar infarction in the left posterior internal capsule.   2.   The brain was otherwise normal for age. 3.   No acute findings.  Normal enhancement pattern.    COGNITION: Overall cognitive status: Within functional limits for tasks assessed   SENSATION: Pt reports some numbness/tingling in RLE, doesn't happen all the time   COORDINATION: Heel to shin: WNL  OBSERVATIONS: Resting LUE tremor  Bilateral ankle swelling (as appt with PCP next month and going to discuss)   POSTURE: rounded shoulders, forward head, and weight shift left, head and trunk tilted to the L    LOWER EXTREMITY MMT:    MMT Right Eval Left Eval  Hip flexion 5 5  Hip extension    Hip abduction    Hip adduction    Hip internal rotation    Hip external rotation    Knee flexion 5 5  Knee extension 5 4+  Ankle dorsiflexion 5 5  Ankle plantarflexion    Ankle inversion    Ankle eversion    (Blank rows = not tested)  BED MOBILITY:   Pt reports no difficulties   TRANSFERS: Sit to stand: SBA  Assistive device utilized: None     Stand to sit: SBA  Assistive device utilized: None      Initial cues for pt to scoot out to the edge before standing with no UE support    FUNCTIONAL TESTS:  5 times sit to stand: 12.7  seconds with no UE support, wide BOS, decr forward lean with incr weight shifted onto heels  10 meter walk test: 11.3 seconds with SPC = 2.90 ft/sec   VITALS  There were no vitals  filed for this visit.                                                                                                                                 TREATMENT:  05-02-24  Aquatic PT at [facility] - pool temp 90 degrees  Patient seen for aquatic therapy today.  Treatment took place in water 3.6 - 4.8 feet deep depending upon activity.  Pt entered and exited the pool via step negotiation with use of hand rails with supervision.   Warm up: water walking - forwards 2 laps (18' x 4); backwards 2 laps (18' x 4 reps) and sideways 2 laps (18' x 4 reps) with large barbells   Stretching:  Runner's stretch for hamstring and gastroc stretch - 20 sec hold x 1 rep each leg Hamstring stretch with foot on pool wall, knee extended 20 sec hold x 1 rep - each leg    Marching in place with contralateral UE movement 10 reps each UE/LE x 2 sets; progressed to marching slowly across pool with contralateral UE movement with barbells to facilitate improved SLS on each leg and big amplitude movements with arms  Squats - 10 reps - pt held large barbells - wide BOS   Pt performed stepping forward/back with 1 leg with simultaneously pushing barbells forward during the stepping, then stepping back and pulling barbells back 10 reps each leg; stepping sideways and back in with shoulder horizontal abdct./adduction during the stepping - 10 reps each leg: pt then performed stepping backward/forward with pushing barbells forward/ pulling barbells in while stepping back to starting position - cues to tighten core for improved core stabilization; for facilitation of large step length RLE and LLE   Balance exercises: Standing hip flexion, abduction and extension 10 reps alternating each leg to facilitate weight shift    Standing on 1 leg holding large barbells/edge of pool - made circles CW 10 reps each leg, then CCW 10 reps each leg -  pt needed UE support on pool edge initially, then progressed to holding barbells for approx. 5  reps each leg  Pt performed Ai Chi posture- Accepting - 10 reps with UE support prn   Pt requires the buoyancy of water for active assisted exercises with buoyancy supported for strengthening and AROM exercises.  Water will provide assistance with movement using the current and laminar flow while the buoyancy reduces weight bearing. Pt requires the viscosity of the water for resistance with strengthening exercises.  Balance exercises able to be safely performed in aquatic environment without the fall risk with performing same exercises on land.  Water current provides perturbations for challenge with static & dynamic standing balance.        PATIENT EDUCATION:  Education details: Standing PWR moves to [employer], provided resources for PD and nutrition and PD symposium  Person educated: Patient Education method: Explanation, Demonstration, Verbal cues, and Handouts Education comprehension: verbalized understanding, returned demonstration, verbal cues required, and needs further education  HOME EXERCISE PROGRAM: Seated PWR moves  Access Code: [email or web address] URL: [email or web address] Date: 03/22/2024 Prepared by: Marlon Plaster  Exercises - Standing ITB Stretch  - 1 x daily - 7 x weekly - 3 sets - 30-60 seconds hold  GOALS: Goals reviewed with patient? Yes  SHORT TERM GOALS: Target date: 04/03/2024  Pt will be independent with initial HEP for strength, gait, balance in order to build upon functional gains made in therapy. Baseline: Goal status: MET  2.  FOG-Q to be assessed with LTG written. Baseline:  Goal status: MET  3.  TUG/cog TUG to be assessed with LTG written.  Baseline:  Goal status: MET  4.  Pt will be able to demo/verbalize understanding of freezing strategies.  Baseline:  Goal status: INITIAL  5.  Push and release to be assessed with LTG written.  Baseline: assessed on 7/17 Goal status: MET    LONG TERM GOALS: Target date: 05/05/2024 (updated to match  cert date)  Pt will be independent with final land and aquatic HEP for strength, gait, balance in order to build upon functional gains made in therapy. Baseline:  Goal status: ON-GOING  2.  Pt will correct forward balance on push and release test in 3 or fewer steps for improved reactive balance strategies and fall prevention  Baseline: would fall if not caught (7/17)  1-2 steps (8/26) Goal status: MET  3.  Pt will improve TUG to less than or equal to 11 seconds w/LRAD for improved functional mobility and decreased fall risk.  Baseline: 13.53s no AD (7/10)  7.4 seconds (8/26) Goal status: MET  4.  Pt will score </= 12/24 on FOG-Q for improved implementation of freezing strategies and reduced fall risk  Baseline: 14/24  10/24 (8/26) Goal status: MET  5.  Pt will verbalize understanding of local Parkinson's disease resources, including options for continue community fitness.  Baseline: provided handout on 05/01/24 Goal status: MET  6.  Pt will improve gait speed with LRAD to at least 3.3 ft/sec in order to demo improved community mobility.   Baseline: 11.3 seconds with SPC = 2.90 ft/sec  10.5 seconds with no AD = 3.12 ft/sec (8/26) Goal status: NOT MET  ON-GOING LTG FOR RE-CERT:  LONG TERM GOALS: Target Date: 05/31/2024  Pt will be independent with final PD specific land and aquatic HEP for strength, gait, balance in order to build upon functional gains made in therapy. Baseline:  Goal status: ON-GOING  ASSESSMENT:  CLINICAL IMPRESSION: Aquatic PT session focused on water walking in various directions, balance exercises with use of barbells for UE support for assist with balance recovery, and exercises to facilitate large amplitude movements of Ue's and LE's.  Pt demonstrated improved balance with less UE support needed.  Pt did have mild postural instability with stepping posteriorly but improved with repetition.  Pt tolerated exercises well.  Cont with POC.       OBJECTIVE IMPAIRMENTS: Abnormal gait, decreased activity tolerance, decreased balance, decreased coordination, decreased knowledge of condition, decreased knowledge of use of DME, decreased mobility, difficulty walking, decreased strength, decreased safety awareness, impaired flexibility, impaired sensation, impaired UE functional use, and postural dysfunction.   ACTIVITY LIMITATIONS: stairs, transfers, and locomotion level  PARTICIPATION LIMITATIONS: driving, shopping, and community activity  PERSONAL FACTORS: Age, Behavior pattern, Past/current experiences, Time since onset of injury/illness/exacerbation, and 3+ comorbidities:  Vascular Parkinsonism/Idiopathic PD, mild cognitive impairment, mild spinal stenosis and spondylosis, remote right PICA and left PCA infarcts in 1982 as well as left basal ganglia lacunar infarct in 2005 with mild residual right sided weakness  are also affecting patient's functional outcome.   REHAB POTENTIAL: Good  CLINICAL DECISION MAKING: Evolving/moderate complexity  EVALUATION COMPLEXITY: Moderate  PLAN:  PT FREQUENCY: 2x/week  PT DURATION: 8 weeks  PLANNED INTERVENTIONS: 97164- PT Re-evaluation, 97110-Therapeutic exercises, 97530- Therapeutic activity, 97112- Neuromuscular re-education, 97535- Self Care, 02859- Manual therapy, [phone]- Gait training, Patient/Family education, Balance training, Stair training, Vestibular training, and DME instructions  PLAN FOR NEXT SESSION: For last land PT session - Pt wants to know what specific exercises we would recommend that he can also keep working on with his trainer. Wants to review his HEP. Get him scheduled for PD screens in 6 months   For aquatic PT - work on balance, giving pt HEP that he can perform in his own pool, add about 2-3 more aquatic appts? And then D/C    Brandom Kerwin Suzanne, PT 05/03/2024, 8:08 AM

## 2024-05-03 NOTE — Therapy (Signed)
 OUTPATIENT OCCUPATIONAL THERAPY PARKINSON'S TREATMENT  Patient Name: Timothy Shields MRN: [id number] DOB:27-Feb-1946, 78 y.o., male Today's Date: 05/03/2024  PCP: Janey Santos, MD  REFERRING PROVIDER: Morita, Hokuto, MD  END OF SESSION:  OT End of Session - 05/03/24 1401     Visit Number 5    Number of Visits 17   max number of visits possible   Date for OT Re-Evaluation 06/22/24    Authorization Type Medicare    OT Start Time 1402    OT Stop Time 1445    OT Time Calculation (min) 43 min    Activity Tolerance Patient tolerated treatment well    Behavior During Therapy WFL for tasks assessed/performed         Past Medical History:  Diagnosis Date   Cerebrovascular disease    Headache(784.0)    Stroke (HCC)    3   Past Surgical History:  Procedure Laterality Date   HERNIA REPAIR     HERNIA REPAIR     Twice in the 1970's   Patient Active Problem List   Diagnosis Date Noted   Erectile dysfunction 09/13/2013   Other generalized ischemic cerebrovascular disease 07/10/2013   Other and unspecified hyperlipidemia 07/10/2013   ONSET DATE: 03/01/2024 (Date of referral)  REFERRING DIAG: G20.A1 (ICD-10-CM) - Parkinson's disease without dyskinesia, without mention of fluctuations  THERAPY DIAG:  Muscle weakness (generalized)  Other symptoms and signs involving the nervous system  Other symptoms and signs involving the musculoskeletal system  Other lack of coordination  Rationale for Evaluation and Treatment: Rehabilitation  SUBJECTIVE:   SUBJECTIVE STATEMENT: He often has difficulty buttoning his cuffs.   Pt accompanied by: self  PERTINENT HISTORY: Per Dr. Rosemarie: remote right PICA and left PCA infarcts in 1982 as well as left basal ganglia lacunar infarct in 2005 with mild residual right sided weakness with recent increase complaints of worsening gait difficulties likely multifactorial due to combination of old strokes, poststroke parkinsonism and mild spinal  stenosis and spondylosis. He also has mild cognitive impairment which appears quite stable     Patient has noticed increasing tremors in his left hand as well as some increase hypophonia and drooling.  He does have some difficulty at times getting up from the chair or wearing his clothes.  He was seen at [facility] neurology for consultation and started on Sinemet  25/100 mg 1-1/2 tablets 3 times daily. He continues to ambulate with a wheeled walker and feels his balance is poor. He has had a few falls in fact 9 in the last several months fortunately no major injury.   Had a driving evaluation from [facility] and was told not to drive. Has had 9 falls in the past year. Goes to a Systems [employer] 3x a week.   PRECAUTIONS: Fall  WEIGHT BEARING RESTRICTIONS: No  PAIN:  Are you having pain? No  FALLS: Has patient fallen in last 6 months? Yes. Number of falls 9  LIVING ENVIRONMENT: Lives with: lives with their spouse, daughter lives close by and son is moving closer  Lives in: House/apartment - will soon be downsizing to an apartment  Stairs: Yes: External: 4 huge stone steps steps; on right going up Has following equipment at home: Single point cane, Walker - 4 wheeled, and Grab bars  PLOF: Independent with household mobility with device, Independent with community mobility with device, Vocation/Vocational requirements: Currently still working, Economist of a Physiological scientist, and Leisure: collects books and baseball cards (has been collecting for 56 years); not driving (  as of a few weeks ago)   PATIENT GOALS: PD management  OBJECTIVE:  Note: Objective measures were completed at Evaluation unless otherwise noted.  HAND DOMINANCE: Right  ADLs: Overall ADLs: mostly mod I; requires assistance with buttons, especially with sleeve cuffs  IADLs: Handwriting: 90% legible, Severe micrographia, and pt reports he has always written small, but it has gotten even smaller. It is better if he thinks about what  he is going to write and takes his time. There are times that he writes something down and then is unable to determine what he wrote.   MOBILITY STATUS: See PT notes  POSTURE COMMENTS:  rounded shoulders, forward head, and weight shift left, head and trunk tilted to the L   ACTIVITY TOLERANCE: Activity tolerance: good to fair  FUNCTIONAL OUTCOME MEASURES: 05/03/2024:Fastening/unfastening 3 buttons: 61 sec Physical performance test: PPT#2 (simulated eating) 22 sec & PPT#4 (donning/doffing jacket): 21 sec  COORDINATION: 05/03/2024: 9 Hole Peg test: Right: 37 sec; Left: 33 sec Box and Blocks:  Right 47blocks, Left 47blocks  UE ROM:  WFL  MUSCLE TONE: TBD  COGNITION: Overall cognitive status: History of cognitive impairments - at baseline  OBSERVATIONS: resting LUE tremor; impulsive; mild bradykinesia                                                                                                                   TREATMENT:  - Self-care/home management completed for duration as noted below including: Objective measures assessed as noted in Goals section to determine progression towards goals. Therapist reviewed goals with patient and updated patient progression. OT educated pt on adaptive strategy for buttoning of button-up shirts including cuffs as noted in pt instructions. Pt able to return demonstration with improved ease.  Pt again encouraged to establish PD binder to keep HEP organized and to improve carryover.   PATIENT EDUCATION: Education details: goal progression; buttoning Person educated: Patient Education method: Programmer, multimedia, Demonstration, Verbal cues, and Handouts Education comprehension: verbalized understanding, returned demonstration, verbal cues required, and needs further education  HOME EXERCISE PROGRAM: 03/23/2024: handwriting samples 04/19/2024: PWR! Hands 04/26/2024: handwriting changes 05/03/2024: buttoning  GOALS:  SHORT TERM GOALS: Target date:  05/17/2024    Pt will be independent with PD specific HEP.  Baseline: not yet initiated Goal status: IN PROGRESS  2.  Pt will verbalize understanding of adapted strategies to maximize safety and independence with ADLs/IADLs.  Baseline: not yet initiated Goal status: IN PROGRESS  3.  Will set appropriate coordination based goal. Baseline: not yet initiated Goal status: MET  LONG TERM GOALS: Target date: 06/22/24    Pt will verbalize understanding of ways to prevent future PD related complications and PD community resources.  Baseline: not yet initiated Goal status: IN PROGRESS  2.  Pt will write a short paragraph with no significant decrease in size and maintain 100% legibility.  Baseline: 90% legibility severe micrographia Goal status: IN PROGRESS  3.  Pt will verbalize understanding of ways to keep thinking skills sharp and ways to compensate  for STM changes in the future.  Baseline: not yet initiated Goal status: INITIAL  4.  Pt will be able to place at least 5 additional blocks using R hand with completion of Box and Blocks test.  Baseline: 04/19/2024: Right - 42 blocks; Left - 49 blocks 05/03/2024: R - 47 blocks Goal status: MET   5.  Pt will demonstrate improved ease with fastening buttons as evidenced by decreasing 3 button/unbutton time by at least 5 seconds or more as appropriate.  Baseline: 04/19/2024: 42 seconds 05/03/2024: 61 seconds  Goal status: IN PROGRESS  6.  Pt will demonstrate improved fine motor coordination for ADLs as evidenced by decreasing 9 hole peg test score for each hand by at least 5 secs or more as appropriate.  Baseline: 04/19/2024: Right - 33 seconds; Left - 38 seconds 05/03/2024: 9 Hole Peg test: Right: 37 sec; Left: 33 sec Goal status: INITIAL   7.  Pt will demonstrate increased ease with dressing as evidenced by decreasing PPT#4 (don/ doff jacket) to 15 secs or less. Baseline: 21 sec Goal status: INITIAL  8.  Pt will demonstrate  improved ease with feeding as evidenced by decreasing PPT#2 by at least 5 seconds.  Baseline: 22 seconds Goal status: INITIAL  ASSESSMENT:  CLINICAL IMPRESSION: Patient demonstrates good understanding of adaptive strategies and specific goals were created for buttoning and simulated eating. Recommend pt establish PD Binder to promote carryover of HEP.   PERFORMANCE DEFICITS: in functional skills including ADLs, IADLs, coordination, ROM, Fine motor control, Gross motor control, mobility, balance, decreased knowledge of precautions, decreased knowledge of use of DME, and UE functional use.   IMPAIRMENTS: are limiting patient from ADLs, IADLs, rest and sleep, leisure, and social participation.   COMORBIDITIES:  may have co-morbidities  that affects occupational performance. Patient will benefit from skilled OT to address above impairments and improve overall function.  REHAB POTENTIAL: Good  PLAN:  OT FREQUENCY: 2x/week  OT DURATION: 8 weeks  PLANNED INTERVENTIONS: 97168 OT Re-evaluation, 97535 self care/ADL training, 02889 therapeutic exercise, 97530 therapeutic activity, 97112 neuromuscular re-education, functional mobility training, coping strategies training, patient/family education, and DME and/or AE instructions  RECOMMENDED OTHER SERVICES: N/A for this visit  CONSULTED AND AGREED WITH PLAN OF CARE: Patient  PLAN FOR NEXT SESSION: encourage pt to speak LOUDLY; PWR! Sitting, coordination HEP   Jocelyn CHRISTELLA Bottom, OT 05/03/2024, 6:15 PM

## 2024-05-03 NOTE — Therapy (Signed)
 OUTPATIENT SPEECH LANGUAGE PATHOLOGY TREATMENT NOTE   Patient Name: Timothy Shields MRN: [id number] DOB:1946/08/27, 78 y.o., male Today's Date: 05/03/2024  PCP: Janey Santos, MD REFERRING PROVIDER: Morita, Hokuto, MD  END OF SESSION:  End of Session - 05/03/24 1230     Visit Number 2    Number of Visits 25    Date for SLP Re-Evaluation 06/21/24    SLP Start Time 1230    SLP Stop Time  1315    SLP Time Calculation (min) 45 min    Activity Tolerance Patient tolerated treatment well           Past Medical History:  Diagnosis Date   Cerebrovascular disease    Headache(784.0)    Stroke (HCC)    3   Past Surgical History:  Procedure Laterality Date   HERNIA REPAIR     HERNIA REPAIR     Twice in the 1970's   Patient Active Problem List   Diagnosis Date Noted   Erectile dysfunction 09/13/2013   Other generalized ischemic cerebrovascular disease 07/10/2013   Other and unspecified hyperlipidemia 07/10/2013    ONSET DATE: 03/01/2024 (referral) PD sx 2-3 years ago  REFERRING DIAG:  Diagnosis  G20.A1 (ICD-10-CM) - Parkinson's disease without dyskinesia, without mention of fluctuations    THERAPY DIAG:  Dysarthria and anarthria  Rationale for Evaluation and Treatment: Rehabilitation  SUBJECTIVE:   SUBJECTIVE STATEMENT: Some days the loudest I can talk is barely understandable  Pt accompanied by: self  PERTINENT HISTORY: Saw a PD specialist at [facility] and reports 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 Prescribed him carbidopa  levodopa  3 and 1/2 tabs for 3 times a day. Symptoms started 2-3 years ago. Started to get the tremors last year. Reports that after starting Carbidopa  he has been able to turn easier in the bed and has been sleeping easier.  Per Dr. Rosemarie: remote right PICA and left PCA infarcts in 1982 as well as left basal ganglia lacunar infarct in 2005 with mild residual  right sided weakness with recent increase complaints of worsening gait difficulties likely multifactorial due to combination of old strokes, poststroke parkinsonism and mild spinal stenosis and spondylosis. He also has mild cognitive impairment which appears quite stable   PAIN:  Are you having pain? No  FALLS: Has patient fallen in last 6 months?  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   COGNITION: Overall cognitive status: Within functional limits for tasks assessed and h/o MCI per Dr. Rosemarie Areas of impairment: Attention and Memory Comments:   MOTOR SPEECH: Overall motor speech: impaired Level of impairment: Word Respiration: thoracic breathing Phonation: breathy, hoarse, and low vocal intensity Resonance: WFL Articulation: Appears intact Intelligibility: Intelligibility reduced Motor planning: Appears intact Motor speech errors: aware Interfering components: n/a Effective technique: increased vocal intensity  ORAL MOTOR EXAMINATION: Overall status: WFL Comments:    OBJECTIVE VOICE ASSESSMENT: Sustained ah maximum phonation time: 15.92 seconds Sustained ah loudness average: 68 dB Oral reading (passage) loudness average: 67 dB Oral reading loudness range: 65-72 dB Conversational loudness average: 63 dB Conversational loudness range: 60- 65dB  Voice quality: hoarse, breathy, and low vocal intensity Stimulability trials: Given SLP modeling and occasional min cues, loudness average increased to 85dB at loud ahand 78dB at sentence level.  Comments:   Completed audio recording of patients baseline  voice without cueing from SLP: Yes  Pt does not report difficulty with swallowing which does not warrant further evaluation.  PATIENT REPORTED OUTCOME MEASURES (PROM): Communication Effectiveness Survey        How effective is your speech... Pt Rating  Having a conversation with a family member or friends at home 2  Participating in conversation with strangers in a quiet place  4  Conversing with a familiar person over the telephone 4  Conversing with a stranger over the telephone 3  Being part of a conversation in a noisy environment  1  Speaking to a friend when you are emotionally upset or angry 4  Having a conversation while traveling in the car 3  Having a conversation with someone at a distance 1  1= not at all effective 4= very effective                                                                                                                            TREATMENT DATE:   05/03/24: Target improving vocal quality and increasing intensity through progressively difficulty speech tasks using Speak Out! program, lesson 1. ST leads pt through exercises providing usual model prior to pt execution. occasional min-A required to achieve target dB this date and avoid volume decay. Averages this date:  Resonant vowels -- 87 dB Sustained ah --  90 dB, holds for average 8 seconds  Counting -- 89 dB Reading phrases --  87 dB Cognitive speech task -- 77 dB.  Conversational sample of approx 5 minutes, pt averages 74 dB with rare min-A. Discussed importance of daily home practice, ideally BID. Pt reports intent to practice using online practice.   03/29/24: Evaluation completed. Introduced Ademide to [employer] ([employer]) website. Demonstrated navigation to What is Parkinson video and on line practice sessions. Initiated training in Speak Out! Lesson 1 - after initial modeling and verbal instructions, Masaki achieved average of 85dB on loud Ah 5/5x, 81dB average on counting and 78dB  average on reading 20/20 sentences. He required frequent mod A to carryover speaking with intent in conversation today. Requested his spouse attend ST sessions. Requested access to [employer] on [employer] website   PATIENT EDUCATION: Education details: See Treatment, See Patient Instructions, HEP, compensations for dysarthria Person educated: Patient Education method: Explanation, Demonstration, Verbal cues, and Handouts Education comprehension: returned demonstration, verbal cues required, and needs further education  HOME EXERCISE PROGRAM: SPEAK OUT!   GOALS: Goals reviewed with patient? Yes  SHORT TERM GOALS: Target date: 05/03/24   Pt will complete HEP daily with occasional min A Baseline: Goal status: INITIAL  2.  Pt will average 72dB and clear phonation 18/20 sentences Baseline:  Goal status: INITIAL  3.  Pt  will complete 2 online practice sessions Baseline:  Goal status: INITIAL  4.  Pt will access E Library and complete 1 lesson from E Library Baseline:  Goal status: INITIAL  5.  Pt will average 70dB over 8 minute conversation with occasional min A Baseline:  Goal status: INITIAL    LONG TERM GOALS: Target date: 06/21/24  Pt will complete HEP twice daily 5/7 days with rare min A Baseline:  Goal status: INITIAL  2.  Pt will average 70dB over 15 minute conversation with rare min A Baseline:  Goal status: INITIAL  3.  Pt will verbalize plan to continue Speak Out! Daily upon d/c from ST Baseline:  Goal status: INITIAL  4.  Pt will improve score on PROM Baseline:  Goal status: INITIAL  ASSESSMENT:  CLINICAL IMPRESSION: Patient is a 78 y.o. 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 He denies any s/s of dysphagia. He reports his wife often needs him to repeat himself and tells him he is speaking too low. Phuc is also aware that others have difficulty hearing him and he is having to  repeat often.He has difficulty communicating over the phone. Voice is hoarse, intermittently aphonic. I recommend skilled ST to maximize intelligibility for safety, to reduce caregiver burden and improve social participation.  OBJECTIVE IMPAIRMENTS: Objective impairments include attention, memory, and dysarthria. These impairments are limiting patient from effectively communicating at home and in community.Factors affecting potential to achieve goals and functional outcome are medical prognosis.. Patient will benefit from skilled SLP services to address above impairments and improve overall function.  REHAB POTENTIAL: Good  PLAN:  SLP FREQUENCY: 1-2x/week  SLP DURATION: 12 weeks  PLANNED INTERVENTIONS: Aspiration precaution training, Diet toleration management , Language facilitation, Environmental controls, Cueing hierachy, Cognitive reorganization, Internal/external aids, Functional tasks, Multimodal communication approach, SLP instruction and feedback, Compensatory strategies, Patient/family education, [phone] Treatment of speech (30 or 45 min) , and [id number] Treatment of swallowing function    Harlene LITTIE Ned, CCC-SLP 05/03/2024, 12:30 PM

## 2024-05-03 NOTE — Patient Instructions (Signed)
 Parkinson Voice Project  What is Parkinson's?   Daily practice videos  GOAL: to practice every day, use booklet or online practice session

## 2024-05-10 ENCOUNTER — Ambulatory Visit: Admitting: Occupational Therapy

## 2024-05-10 ENCOUNTER — Other Ambulatory Visit: Payer: Self-pay | Admitting: Neurology

## 2024-05-10 ENCOUNTER — Ambulatory Visit: Attending: Neurology | Admitting: Physical Therapy

## 2024-05-10 DIAGNOSIS — R29898 Other symptoms and signs involving the musculoskeletal system: Secondary | ICD-10-CM | POA: Diagnosis not present

## 2024-05-10 DIAGNOSIS — R471 Dysarthria and anarthria: Secondary | ICD-10-CM | POA: Insufficient documentation

## 2024-05-10 DIAGNOSIS — R2681 Unsteadiness on feet: Secondary | ICD-10-CM | POA: Insufficient documentation

## 2024-05-10 DIAGNOSIS — R278 Other lack of coordination: Secondary | ICD-10-CM | POA: Insufficient documentation

## 2024-05-10 DIAGNOSIS — R29818 Other symptoms and signs involving the nervous system: Secondary | ICD-10-CM | POA: Insufficient documentation

## 2024-05-10 DIAGNOSIS — M6281 Muscle weakness (generalized): Secondary | ICD-10-CM | POA: Insufficient documentation

## 2024-05-10 NOTE — Therapy (Signed)
 OUTPATIENT OCCUPATIONAL THERAPY PARKINSON'S TREATMENT  Patient Name: Timothy Shields MRN: [id number] DOB:29-Oct-1945, 78 y.o., male Today's Date: 05/10/2024  PCP: Timothy Santos, MD  REFERRING PROVIDER: Morita, Hokuto, MD  END OF SESSION:  OT End of Session - 05/10/24 1719     Visit Number 6    Number of Visits 17   max number of visits possible   Date for OT Re-Evaluation 06/22/24    Authorization Type Medicare    OT Start Time 1403    OT Stop Time 1445    OT Time Calculation (min) 42 min    Activity Tolerance Patient tolerated treatment well    Behavior During Therapy WFL for tasks assessed/performed          Past Medical History:  Diagnosis Date   Cerebrovascular disease    Headache(784.0)    Stroke (HCC)    3   Past Surgical History:  Procedure Laterality Date   HERNIA REPAIR     HERNIA REPAIR     Twice in the 1970's   Patient Active Problem List   Diagnosis Date Noted   Erectile dysfunction 09/13/2013   Other generalized ischemic cerebrovascular disease 07/10/2013   Other and unspecified hyperlipidemia 07/10/2013   ONSET DATE: 03/01/2024 (Date of referral)  REFERRING DIAG: G20.A1 (ICD-10-CM) - Parkinson's disease without dyskinesia, without mention of fluctuations  THERAPY DIAG:  Muscle weakness (generalized)  Other symptoms and signs involving the nervous system  Other symptoms and signs involving the musculoskeletal system  Other lack of coordination  Rationale for Evaluation and Treatment: Rehabilitation  SUBJECTIVE:   SUBJECTIVE STATEMENT: He doesn't have as much difficulty now as he did with his jacket.   Pt accompanied by: self  PERTINENT HISTORY: Per Timothy Shields: remote right PICA and left PCA infarcts in 1982 as well as left basal ganglia lacunar infarct in 2005 with mild residual right sided weakness with recent increase complaints of worsening gait difficulties likely multifactorial due to combination of old strokes, poststroke  parkinsonism and mild spinal stenosis and spondylosis. He also has mild cognitive impairment which appears quite stable     Patient has noticed increasing tremors in his left hand as well as some increase hypophonia and drooling.  He does have some difficulty at times getting up from the chair or wearing his clothes.  He was seen at [facility] neurology for consultation and started on Sinemet  25/100 mg 1-1/2 tablets 3 times daily. He continues to ambulate with a wheeled walker and feels his balance is poor. He has had a few falls in fact 9 in the last several months fortunately no major injury.   Had a driving evaluation from [facility] and was told not to drive. Has had 9 falls in the past year. Goes to a Systems [employer] 3x a week.   PRECAUTIONS: Fall  WEIGHT BEARING RESTRICTIONS: No  PAIN:  Are you having pain? No  FALLS: Has patient fallen in last 6 months? Yes. Number of falls 9  LIVING ENVIRONMENT: Lives with: lives with their spouse, daughter lives close by and son is moving closer  Lives in: House/apartment - will soon be downsizing to an apartment  Stairs: Yes: External: 4 huge stone steps steps; on right going up Has following equipment at home: Single point cane, Walker - 4 wheeled, and Grab bars  PLOF: Independent with household mobility with device, Independent with community mobility with device, Vocation/Vocational requirements: Currently still working, Economist of a Physiological scientist, and Leisure: collects books and baseball cards (has  been collecting for 56 years); not driving (as of a few weeks ago)   PATIENT GOALS: PD management  OBJECTIVE:  Note: Objective measures were completed at Evaluation unless otherwise noted.  HAND DOMINANCE: Right  ADLs: Overall ADLs: mostly mod I; requires assistance with buttons, especially with sleeve cuffs  IADLs: Handwriting: 90% legible, Severe micrographia, and pt reports he has always written small, but it has gotten even smaller. It is  better if he thinks about what he is going to write and takes his time. There are times that he writes something down and then is unable to determine what he wrote.   MOBILITY STATUS: See PT notes  POSTURE COMMENTS:  rounded shoulders, forward head, and weight shift left, head and trunk tilted to the L   ACTIVITY TOLERANCE: Activity tolerance: good to fair  FUNCTIONAL OUTCOME MEASURES: 05/03/2024:Fastening/unfastening 3 buttons: 61 sec Physical performance test: PPT#2 (simulated eating) 22 sec & PPT#4 (donning/doffing jacket): 21 sec  COORDINATION: 05/03/2024: 9 Hole Peg test: Right: 37 sec; Left: 33 sec Box and Blocks:  Right 47blocks, Left 47blocks  UE ROM:  WFL  MUSCLE TONE: TBD  COGNITION: Overall cognitive status: History of cognitive impairments - at baseline  OBSERVATIONS: resting LUE tremor; impulsive; mild bradykinesia                                                                                                                   TREATMENT:   - Self-care/home management completed for duration as noted below including: OT educated pt on adaptive strategy for donning and doffing jacket as noted in pt instructions. Pt able to return demonstration with improved ease.  Pt again encouraged to establish PD binder to keep HEP organized and to improve carryover.  Reviewed coordination HEP as noted in pt instructions and had pt complete additional activities including loosening and tightening of bolts.  Reviewed normative data for physical performance test 2 and 4 for better understanding of pt goals.   PATIENT EDUCATION: Education details: see above Person educated: Patient Education method: Explanation, Demonstration, Verbal cues, and Handouts Education comprehension: verbalized understanding, returned demonstration, verbal cues required, and needs further education  HOME EXERCISE PROGRAM: 03/23/2024: handwriting samples 04/19/2024: PWR! Hands 04/26/2024: handwriting  changes 05/01/2024: Coordination HEP 05/03/2024: buttoning 05/10/2024: Jacket handout  GOALS:  SHORT TERM GOALS: Target date: 05/17/2024    Pt will be independent with PD specific HEP.  Baseline: not yet initiated Goal status: IN PROGRESS  2.  Pt will verbalize understanding of adapted strategies to maximize safety and independence with ADLs/IADLs.  Baseline: not yet initiated Goal status: IN PROGRESS  3.  Will set appropriate coordination based goal. Baseline: not yet initiated Goal status: MET  LONG TERM GOALS: Target date: 06/22/24    Pt will verbalize understanding of ways to prevent future PD related complications and PD community resources.  Baseline: not yet initiated Goal status: IN PROGRESS  2.  Pt will write a short paragraph with no significant decrease in size and maintain 100% legibility.  Baseline: 90% legibility severe micrographia Goal status: IN PROGRESS  3.  Pt will verbalize understanding of ways to keep thinking skills sharp and ways to compensate for STM changes in the future.  Baseline: not yet initiated Goal status: INITIAL  4.  Pt will be able to place at least 5 additional blocks using R hand with completion of Box and Blocks test.  Baseline: 04/19/2024: Right - 42 blocks; Left - 49 blocks 05/03/2024: R - 47 blocks Goal status: MET   5.  Pt will demonstrate improved ease with fastening buttons as evidenced by decreasing 3 button/unbutton time by at least 5 seconds or more as appropriate.  Baseline: 04/19/2024: 42 seconds 05/03/2024: 61 seconds  Goal status: IN PROGRESS  6.  Pt will demonstrate improved fine motor coordination for ADLs as evidenced by decreasing 9 hole peg test score for each hand by at least 5 secs or more as appropriate.  Baseline: 04/19/2024: Right - 33 seconds; Left - 38 seconds 05/03/2024: 9 Hole Peg test: Right: 37 sec; Left: 33 sec Goal status: INITIAL   7.  Pt will demonstrate increased ease with dressing as evidenced by  decreasing PPT#4 (don/ doff jacket) to 15 secs or less. Baseline: 21 sec Goal status: INITIAL  8.  Pt will demonstrate improved ease with feeding as evidenced by decreasing PPT#2 by at least 5 seconds.  Baseline: 22 seconds Goal status: INITIAL  ASSESSMENT:  CLINICAL IMPRESSION: Patient demonstrates improved understanding of adaptive strategies and insight to specific goals. Will review feeding strategies at next session. Recommend pt establish PD Binder to promote carryover of HEP.   PERFORMANCE DEFICITS: in functional skills including ADLs, IADLs, coordination, ROM, Fine motor control, Gross motor control, mobility, balance, decreased knowledge of precautions, decreased knowledge of use of DME, and UE functional use.   IMPAIRMENTS: are limiting patient from ADLs, IADLs, rest and sleep, leisure, and social participation.   COMORBIDITIES:  may have co-morbidities  that affects occupational performance. Patient will benefit from skilled OT to address above impairments and improve overall function.  REHAB POTENTIAL: Good  PLAN:  OT FREQUENCY: 2x/week  OT DURATION: 8 weeks  PLANNED INTERVENTIONS: 97168 OT Re-evaluation, 97535 self care/ADL training, 02889 therapeutic exercise, 97530 therapeutic activity, 97112 neuromuscular re-education, functional mobility training, coping strategies training, patient/family education, and DME and/or AE instructions  RECOMMENDED OTHER SERVICES: N/A for this visit  CONSULTED AND AGREED WITH PLAN OF CARE: Patient  PLAN FOR NEXT SESSION: encourage pt to speak LOUDLY; PWR! Sitting; exercise chart; practice with eating   Jocelyn CHRISTELLA Bottom, OT 05/10/2024, 5:20 PM

## 2024-05-10 NOTE — Therapy (Signed)
 OUTPATIENT PHYSICAL THERAPY NEURO THERAPY TREATMENT- DISCHARGE SUMMARY   Patient Name: Timothy Shields MRN: [id number] DOB:[birth year], 78 y.o., male Today's Date: 05/10/2024   PCP: Avva, Ravisankar, MD  REFERRING PROVIDER: Rosemarie Eather RAMAN, MD  PHYSICAL THERAPY DISCHARGE SUMMARY  Visits from Start of Care: 15  Current functional level related to goals / functional outcomes: Mod I w/all ADLs, use of rollator in community and no AD at home    Remaining deficits: Low fall risk, tremor, decreased step clearance w/RLE    Education / Equipment: HEP, 75mo PD screens    Patient agrees to discharge. Patient goals were met. Patient is being discharged due to being pleased with the current functional level.    END OF SESSION:  PT End of Session - 05/10/24 1450     Visit Number 15    Number of Visits 18    Date for PT Re-Evaluation 05/31/24    Authorization Type MEDICARE PART A AND B    PT Start Time 1449    PT Stop Time 1531    PT Time Calculation (min) 42 min    Equipment Utilized During Treatment --    Activity Tolerance Patient tolerated treatment well    Behavior During Therapy WFL for tasks assessed/performed              Past Medical History:  Diagnosis Date   Cerebrovascular disease    Headache(784.0)    Stroke (HCC)    3   Past Surgical History:  Procedure Laterality Date   HERNIA REPAIR     HERNIA REPAIR     Twice in the 1970's   Patient Active Problem List   Diagnosis Date Noted   Erectile dysfunction 09/13/2013   Other generalized ischemic cerebrovascular disease 07/10/2013   Other and unspecified hyperlipidemia 07/10/2013    ONSET DATE: 02/14/2024  REFERRING DIAG: R26.9 (ICD-10-CM) - Gait disorder   THERAPY DIAG:  Muscle weakness (generalized)  Other symptoms and signs involving the nervous system  Other lack of coordination  Unsteadiness on feet  Rationale for Evaluation and Treatment: Rehabilitation  SUBJECTIVE:                                                                                                                                                                                              SUBJECTIVE STATEMENT: Pt presents w/rollator, handoff w/OT. Pt reports doing fantastic. Denies falls or acute changes. Was able to lift 40# boxes the other day and was surprised he was able to do it.   Pt accompanied by:  self  PERTINENT HISTORY:    Per Dr. Rosemarie: remote right  PICA and left PCA infarcts in 1982 as well as left basal ganglia lacunar infarct in 2005 with mild residual right sided weakness with recent increase complaints of worsening gait difficulties likely multifactorial due to combination of old strokes, poststroke parkinsonism and mild spinal stenosis and spondylosis. He also has mild cognitive impairment which appears quite stable    Patient has noticed increasing tremors in his left hand as well as some increase hypophonia and drooling.  He does have some difficulty at times getting up from the chair or wearing his clothes.  He was seen at [facility] neurology for consultation and started on Sinemet  25/100 mg 1-1/2 tablets 3 times daily. He continues to ambulate with a wheeled walker and feels his balance is poor. He has had a few falls in fact 9 in the last several months fortunately no major injury.   PAIN:  Are you having pain? Yes: NPRS scale: right now it is 0/10  Pain location: R hip Pain description: achy    PRECAUTIONS: Fall  FALLS: Has patient fallen in last 6 months? Reports 9 falls in the past year, reports most falls occur when going up on his toes   LIVING ENVIRONMENT: Lives with: lives with their spouse, daughter lives close by and son is moving closer  Lives in: House/apartment - will soon be downsizing to an apartment  Stairs: Yes: External: 4 huge stone steps steps; on right going up Has following equipment at home: Single point cane, Walker - 4 wheeled, and Grab bars  PLOF: Independent with  household mobility with device, Independent with community mobility with device, Vocation/Vocational requirements: Currently still working, Theatre manager a Physiological scientist, and Leisure: collects books and baseball cards (has been collecting for 56 years)  No driving   PATIENT GOALS: I don't know, everyone just tells me I should come here   OBJECTIVE:  Note: Objective measures were completed at Evaluation unless otherwise noted.  DIAGNOSTIC FINDINGS: MRI brain 09/2021: IMPRESSION: This MRI of the brain with and without contrast shows the following: 1.   Remote left posterior cerebral artery infarction, remote right posterior inferior cerebellar artery infarction and remote lacunar infarction in the left posterior internal capsule.   2.   The brain was otherwise normal for age. 3.   No acute findings.  Normal enhancement pattern.    COGNITION: Overall cognitive status: Within functional limits for tasks assessed   SENSATION: Pt reports some numbness/tingling in RLE, doesn't happen all the time   COORDINATION: Heel to shin: WNL  OBSERVATIONS: Resting LUE tremor  Bilateral ankle swelling (as appt with PCP next month and going to discuss)   POSTURE: rounded shoulders, forward head, and weight shift left, head and trunk tilted to the L    LOWER EXTREMITY MMT:    MMT Right Eval Left Eval  Hip flexion 5 5  Hip extension    Hip abduction    Hip adduction    Hip internal rotation    Hip external rotation    Knee flexion 5 5  Knee extension 5 4+  Ankle dorsiflexion 5 5  Ankle plantarflexion    Ankle inversion    Ankle eversion    (Blank rows = not tested)  BED MOBILITY:   Pt reports no difficulties   TRANSFERS: Sit to stand: SBA  Assistive device utilized: None     Stand to sit: SBA  Assistive device utilized: None      Initial cues for pt to scoot out to the edge before  standing with no UE support    FUNCTIONAL TESTS:  5 times sit to stand: 12.7 seconds  with no UE support, wide BOS, decr forward lean with incr weight shifted onto heels  10 meter walk test: 11.3 seconds with SPC = 2.90 ft/sec   VITALS  There were no vitals filed for this visit.                                                                                                                                 TREATMENT:  Ther Act  Provided therapeutic listening as pt discussed the impact PT has had on his mobility and how appreciative he is. Informed pt to continue working on his HEP and encouraged him to try [employer], Tai Chi, golf lessons and tennis lessons as these are things pt has an interest in. Pt reports he is going to set up golf lessons first.  Discussed plan to set up 39mo PD screens and pt in agreement with this. Provided pt w/sticky note of exercises his trainer can work on at the gym:  Deadlifts: single arm and double arm variations Wall balls: squat w/med ball throw  Med ball cleans  Hang snatches w/DB  Push-ups w/strong push away Verbally reviewed pt's HEP but pt reports he feels good w/this and does not need to go over it in clinic.  Informed pt that aquatic PT, Elvie, will email him an aquatic HEP. Pt verbalized understanding      PATIENT EDUCATION: Education details: Final HEP, things to work on at [employer], plan for 39mo PD screens  Person educated: Patient Education method: Explanation and Handouts Education comprehension: verbalized understanding  HOME EXERCISE PROGRAM: Seated PWR moves  Access Code: [email or web address] URL: [email or web address] Date: 03/22/2024 Prepared by: Marlon Maecyn Panning  Exercises - Standing ITB Stretch  - 1 x daily - 7 x weekly - 3 sets - 30-60 seconds hold  GOALS: Goals reviewed with patient? Yes  SHORT TERM GOALS: Target date: 04/03/2024  Pt will be independent with initial HEP for strength, gait, balance in order to build upon functional gains made in therapy. Baseline: Goal status: MET  2.  FOG-Q  to be assessed with LTG written. Baseline:  Goal status: MET  3.  TUG/cog TUG to be assessed with LTG written.  Baseline:  Goal status: MET  4.  Pt will be able to demo/verbalize understanding of freezing strategies.  Baseline:  Goal status: INITIAL  5.  Push and release to be assessed with LTG written.  Baseline: assessed on 7/17 Goal status: MET    LONG TERM GOALS: Target date: 05/05/2024 (updated to match cert date)  Pt will be independent with final land and aquatic HEP for strength, gait, balance in order to build upon functional gains made in therapy. Baseline:  Goal status: ON-GOING  2.  Pt will correct forward balance on push and release test in 3 or fewer steps for improved reactive  balance strategies and fall prevention  Baseline: would fall if not caught (7/17)  1-2 steps (8/26) Goal status: MET  3.  Pt will improve TUG to less than or equal to 11 seconds w/LRAD for improved functional mobility and decreased fall risk.  Baseline: 13.53s no AD (7/10)  7.4 seconds (8/26) Goal status: MET  4.  Pt will score </= 12/24 on FOG-Q for improved implementation of freezing strategies and reduced fall risk  Baseline: 14/24  10/24 (8/26) Goal status: MET  5.  Pt will verbalize understanding of local Parkinson's disease resources, including options for continue community fitness.  Baseline: provided handout on 05/01/24 Goal status: MET  6.  Pt will improve gait speed with LRAD to at least 3.3 ft/sec in order to demo improved community mobility.   Baseline: 11.3 seconds with SPC = 2.90 ft/sec  10.5 seconds with no AD = 3.12 ft/sec (8/26) Goal status: NOT MET  ON-GOING LTG FOR RE-CERT:  LONG TERM GOALS: Target Date: 05/31/2024  Pt will be independent with final PD specific land and aquatic HEP for strength, gait, balance in order to build upon functional gains made in therapy. Baseline:  Goal status: MET  ASSESSMENT:  CLINICAL IMPRESSION: Emphasis of skilled PT  session on LTG assessment and DC from PT. Overall, pt is doing exceptionally well, demonstrating ability to ambulate w/full step clearance without use of AD and reporting improved independence w/tasks at home. Pt continues to see his trainer 2x/week so provided pt w/more exercises that his trainer can incorporate into his workouts to work on high amplitude/velocity movement, anterior weight shifting and dynamic balance. Pt verbalized readiness to DC from PT this date and is very appreciative of all the progress he has made. Pt scheduled for 43mo PD screens but was informed to return sooner if mobility needs change.     OBJECTIVE IMPAIRMENTS: Abnormal gait, decreased activity tolerance, decreased balance, decreased coordination, decreased knowledge of condition, decreased knowledge of use of DME, decreased mobility, difficulty walking, decreased strength, decreased safety awareness, impaired flexibility, impaired sensation, impaired UE functional use, and postural dysfunction.   ACTIVITY LIMITATIONS: stairs, transfers, and locomotion level  PARTICIPATION LIMITATIONS: driving, shopping, and community activity  PERSONAL FACTORS: Age, Behavior pattern, Past/current experiences, Time since onset of injury/illness/exacerbation, and 3+ comorbidities:  Vascular Parkinsonism/Idiopathic PD, mild cognitive impairment, mild spinal stenosis and spondylosis, remote right PICA and left PCA infarcts in 1982 as well as left basal ganglia lacunar infarct in 2005 with mild residual right sided weakness  are also affecting patient's functional outcome.   REHAB POTENTIAL: Good  CLINICAL DECISION MAKING: Evolving/moderate complexity  EVALUATION COMPLEXITY: Moderate  PLAN:  PT FREQUENCY: 2x/week  PT DURATION: 8 weeks  PLANNED INTERVENTIONS: 97164- PT Re-evaluation, 97110-Therapeutic exercises, 97530- Therapeutic activity, 97112- Neuromuscular re-education, 97535- Self Care, 02859- Manual therapy, [phone]- Gait  training, Patient/Family education, Balance training, Stair training, Vestibular training, and DME instructions    Marlon BRAVO Jerlisa Diliberto, PT, DPT 05/10/2024, 3:37 PM

## 2024-05-10 NOTE — Patient Instructions (Addendum)
 To DON jacket:   1) Wide stance 2) Hold facing tag out with hands shoulder width apart at sleeves 3) Swing around back big like a cape to RIGHT side 4) Punch RIGHT arm in as you continue to pull behind you with LEFT hand to LEFT 5) Punch LEFT arm in   To DOFF jacket:   1) Wide stance 2) Hold sides of jacket with big hands at belly button level 3 opt1) Throw arms behind you like PWR! Up 3 opt 1) Slide jacket down arms 3 opt2) Turn and twist to RIGHT to take off RIGHT shoulder 3 opt2) Turn and twist to LEFT to take off LEFT shoulder 4) Reach big behind you and grab cuff of sleeve BIG and yank off one hand, then the other

## 2024-05-14 ENCOUNTER — Ambulatory Visit: Admitting: Speech Pathology

## 2024-05-14 ENCOUNTER — Ambulatory Visit: Admitting: Occupational Therapy

## 2024-05-14 ENCOUNTER — Encounter: Payer: Self-pay | Admitting: Speech Pathology

## 2024-05-14 DIAGNOSIS — R278 Other lack of coordination: Secondary | ICD-10-CM

## 2024-05-14 DIAGNOSIS — R2681 Unsteadiness on feet: Secondary | ICD-10-CM | POA: Diagnosis not present

## 2024-05-14 DIAGNOSIS — R29898 Other symptoms and signs involving the musculoskeletal system: Secondary | ICD-10-CM | POA: Diagnosis not present

## 2024-05-14 DIAGNOSIS — R29818 Other symptoms and signs involving the nervous system: Secondary | ICD-10-CM

## 2024-05-14 DIAGNOSIS — R471 Dysarthria and anarthria: Secondary | ICD-10-CM | POA: Diagnosis not present

## 2024-05-14 DIAGNOSIS — M6281 Muscle weakness (generalized): Secondary | ICD-10-CM

## 2024-05-14 NOTE — Patient Instructions (Signed)
  This week - do an Comptroller session with Lucie at least once  If Mrs. Pomplun has a minute to attend ST that would be helpful - if not, she can come the next course of ST  Go online and access the [employer], Mrs. Minder, your son watch the What is Parkinson's? Video  Practice you name, DOB, address, email and cell phone with intent - Being loud and clear and slow like you are giving the information to

## 2024-05-14 NOTE — Therapy (Signed)
 OUTPATIENT OCCUPATIONAL THERAPY PARKINSON'S TREATMENT  Patient Name: Timothy Shields MRN: [id number] DOB:01-16-1946, 79 y.o., male Today's Date: 05/14/2024  PCP: Janey Santos, MD  REFERRING PROVIDER: Morita, Hokuto, MD  END OF SESSION:  OT End of Session - 05/14/24 1323     Visit Number 7    Number of Visits 17   max number of visits possible   Date for OT Re-Evaluation 06/22/24    Authorization Type Medicare    OT Start Time 1320    OT Stop Time 1400    OT Time Calculation (min) 40 min    Activity Tolerance Patient tolerated treatment well    Behavior During Therapy WFL for tasks assessed/performed          Past Medical History:  Diagnosis Date   Cerebrovascular disease    Headache(784.0)    Stroke (HCC)    3   Past Surgical History:  Procedure Laterality Date   HERNIA REPAIR     HERNIA REPAIR     Twice in the 1970's   Patient Active Problem List   Diagnosis Date Noted   Erectile dysfunction 09/13/2013   Other generalized ischemic cerebrovascular disease 07/10/2013   Other and unspecified hyperlipidemia 07/10/2013   ONSET DATE: 03/01/2024 (Date of referral)  REFERRING DIAG: G20.A1 (ICD-10-CM) - Parkinson's disease without dyskinesia, without mention of fluctuations  THERAPY DIAG:  Muscle weakness (generalized)  Other symptoms and signs involving the nervous system  Other symptoms and signs involving the musculoskeletal system  Other lack of coordination  Unsteadiness on feet  Rationale for Evaluation and Treatment: Rehabilitation  SUBJECTIVE:   SUBJECTIVE STATEMENT: He doesn't have as much difficulty now as he did with his jacket.   Pt accompanied by: self  PERTINENT HISTORY: Per Dr. Rosemarie: remote right PICA and left PCA infarcts in 1982 as well as left basal ganglia lacunar infarct in 2005 with mild residual right sided weakness with recent increase complaints of worsening gait difficulties likely multifactorial due to combination of old  strokes, poststroke parkinsonism and mild spinal stenosis and spondylosis. He also has mild cognitive impairment which appears quite stable     Patient has noticed increasing tremors in his left hand as well as some increase hypophonia and drooling.  He does have some difficulty at times getting up from the chair or wearing his clothes.  He was seen at [facility] neurology for consultation and started on Sinemet  25/100 mg 1-1/2 tablets 3 times daily. He continues to ambulate with a wheeled walker and feels his balance is poor. He has had a few falls in fact 9 in the last several months fortunately no major injury.   Had a driving evaluation from [facility] and was told not to drive. Has had 9 falls in the past year. Goes to a Systems analyst 3x a week.   PRECAUTIONS: Fall  WEIGHT BEARING RESTRICTIONS: No  PAIN:  Are you having pain? No  FALLS: Has patient fallen in last 6 months? Yes. Number of falls 9  LIVING ENVIRONMENT: Lives with: lives with their spouse, daughter lives close by and son is moving closer  Lives in: House/apartment - will soon be downsizing to an apartment  Stairs: Yes: External: 4 huge stone steps steps; on right going up Has following equipment at home: Single point cane, Walker - 4 wheeled, and Grab bars  PLOF: Independent with household mobility with device, Independent with community mobility with device, Vocation/Vocational requirements: Currently still working, Economist of a Physiological scientist, and Leisure: Adult nurse  and baseball cards (has been collecting for 56 years); not driving (as of a few weeks ago)   PATIENT GOALS: PD management  OBJECTIVE:  Note: Objective measures were completed at Evaluation unless otherwise noted.  HAND DOMINANCE: Right  ADLs: Overall ADLs: mostly mod I; requires assistance with buttons, especially with sleeve cuffs  IADLs: Handwriting: 90% legible, Severe micrographia, and pt reports he has always written small, but it has gotten  even smaller. It is better if he thinks about what he is going to write and takes his time. There are times that he writes something down and then is unable to determine what he wrote.   MOBILITY STATUS: See PT notes  POSTURE COMMENTS:  rounded shoulders, forward head, and weight shift left, head and trunk tilted to the L   ACTIVITY TOLERANCE: Activity tolerance: good to fair  FUNCTIONAL OUTCOME MEASURES: 05/03/2024:Fastening/unfastening 3 buttons: 61 sec Physical performance test: PPT#2 (simulated eating) 22 sec & PPT#4 (donning/doffing jacket): 21 sec  COORDINATION: 05/03/2024: 9 Hole Peg test: Right: 37 sec; Left: 33 sec Box and Blocks:  Right 47blocks, Left 47blocks  UE ROM:  WFL  MUSCLE TONE: TBD  COGNITION: Overall cognitive status: History of cognitive impairments - at baseline  OBSERVATIONS: resting LUE tremor; impulsive; mild bradykinesia                                                                                                                   TREATMENT:   Asked pt to bring in all HEP's/handouts and 3 ring binder next visit for organization and greater carryover at home. Will take into consideration going to personal trainer 3x/week Pt issued PWR! Sitting (basic 4) and reviewed each x 10-20 reps with cues for large amplitude movements.  Simulated eating task w/ cues to bring food up to mouth vs bringing head down to food. Pt shown alternative way for eating soup by bringing bowl up closer w/ other hand.  Assessed PPT #2 w/ much improvement going from 22 sec down to 13 sec  PATIENT EDUCATION: Education details: see above Person educated: Patient Education method: Explanation, Demonstration, Verbal cues, and Handouts Education comprehension: verbalized understanding, returned demonstration, verbal cues required, and needs further education  HOME EXERCISE PROGRAM: 03/23/2024: handwriting samples 04/19/2024: PWR! Hands 04/26/2024: handwriting changes 05/01/2024:  Coordination HEP 05/03/2024: buttoning 05/10/2024: Jacket handout  GOALS:  SHORT TERM GOALS: Target date: 05/17/2024    Pt will be independent with PD specific HEP.  Baseline: not yet initiated Goal status: IN PROGRESS  2.  Pt will verbalize understanding of adapted strategies to maximize safety and independence with ADLs/IADLs.  Baseline: not yet initiated Goal status: IN PROGRESS  3.  Will set appropriate coordination based goal. Baseline: not yet initiated Goal status: MET  LONG TERM GOALS: Target date: 06/22/24    Pt will verbalize understanding of ways to prevent future PD related complications and PD community resources.  Baseline: not yet initiated Goal status: IN PROGRESS  2.  Pt will write a short paragraph with  no significant decrease in size and maintain 100% legibility.  Baseline: 90% legibility severe micrographia Goal status: IN PROGRESS  3.  Pt will verbalize understanding of ways to keep thinking skills sharp and ways to compensate for STM changes in the future.  Baseline: not yet initiated Goal status: INITIAL  4.  Pt will be able to place at least 5 additional blocks using R hand with completion of Box and Blocks test.  Baseline: 04/19/2024: Right - 42 blocks; Left - 49 blocks 05/03/2024: R - 47 blocks Goal status: MET   5.  Pt will demonstrate improved ease with fastening buttons as evidenced by decreasing 3 button/unbutton time by at least 5 seconds or more as appropriate.  Baseline: 04/19/2024: 42 seconds 05/03/2024: 61 seconds  Goal status: IN PROGRESS  6.  Pt will demonstrate improved fine motor coordination for ADLs as evidenced by decreasing 9 hole peg test score for each hand by at least 5 secs or more as appropriate.  Baseline: 04/19/2024: Right - 33 seconds; Left - 38 seconds 05/03/2024: 9 Hole Peg test: Right: 37 sec; Left: 33 sec Goal status: INITIAL   7.  Pt will demonstrate increased ease with dressing as evidenced by decreasing PPT#4  (don/ doff jacket) to 15 secs or less. Baseline: 21 sec Goal status: INITIAL  8.  Pt will demonstrate improved ease with feeding as evidenced by decreasing PPT#2 by at least 5 seconds.  Baseline: 22 seconds Goal status: MET (05/14/24: 13 sec)   ASSESSMENT:  CLINICAL IMPRESSION: Patient demonstrates improvements in function including dressing and simulated eating tasks. Introduced [employer]! Sitting today w/ good response to cues.   PERFORMANCE DEFICITS: in functional skills including ADLs, IADLs, coordination, ROM, Fine motor control, Gross motor control, mobility, balance, decreased knowledge of precautions, decreased knowledge of use of DME, and UE functional use.   IMPAIRMENTS: are limiting patient from ADLs, IADLs, rest and sleep, leisure, and social participation.   COMORBIDITIES:  may have co-morbidities  that affects occupational performance. Patient will benefit from skilled OT to address above impairments and improve overall function.  REHAB POTENTIAL: Good  PLAN:  OT FREQUENCY: 2x/week  OT DURATION: 8 weeks  PLANNED INTERVENTIONS: 97168 OT Re-evaluation, 97535 self care/ADL training, 02889 therapeutic exercise, 97530 therapeutic activity, 97112 neuromuscular re-education, functional mobility training, coping strategies training, patient/family education, and DME and/or AE instructions  RECOMMENDED OTHER SERVICES: N/A for this visit  CONSULTED AND AGREED WITH PLAN OF CARE: Patient  PLAN FOR NEXT SESSION: encourage pt to speak LOUDLY; pt to bring in 3 ring binder and all ex's next visit to organize and issue PD exercise chart; progress towards goals   Burnard JINNY Roads, OT 05/14/2024, 1:27 PM

## 2024-05-14 NOTE — Therapy (Signed)
 OUTPATIENT SPEECH LANGUAGE PATHOLOGY TREATMENT NOTE   Patient Name: Timothy Shields MRN: [id number] DOB:Oct 31, 1945, 78 y.o., male Today's Date: 05/14/2024  PCP: Janey Santos, MD REFERRING PROVIDER: Morita, Hokuto, MD  END OF SESSION:  End of Session - 05/14/24 1356     Visit Number 3    Number of Visits 25    Date for SLP Re-Evaluation 06/21/24    SLP Start Time 1400    SLP Stop Time  1445    SLP Time Calculation (min) 45 min    Activity Tolerance Patient tolerated treatment well           Past Medical History:  Diagnosis Date   Cerebrovascular disease    Headache(784.0)    Stroke (HCC)    3   Past Surgical History:  Procedure Laterality Date   HERNIA REPAIR     HERNIA REPAIR     Twice in the 1970's   Patient Active Problem List   Diagnosis Date Noted   Erectile dysfunction 09/13/2013   Other generalized ischemic cerebrovascular disease 07/10/2013   Other and unspecified hyperlipidemia 07/10/2013    ONSET DATE: 03/01/2024 (referral) PD sx 2-3 years ago  REFERRING DIAG:  Diagnosis  G20.A1 (ICD-10-CM) - Parkinson's disease without dyskinesia, without mention of fluctuations    THERAPY DIAG:  Dysarthria and anarthria  Rationale for Evaluation and Treatment: Rehabilitation  SUBJECTIVE:   SUBJECTIVE STATEMENT: Some days the loudest I can talk is barely understandable  Pt accompanied by: self  PERTINENT HISTORY: Saw a PD specialist at [facility] and reports 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 Prescribed him carbidopa  levodopa  3 and 1/2 tabs for 3 times a day. Symptoms started 2-3 years ago. Started to get the tremors last year. Reports that after starting Carbidopa  he has been able to turn easier in the bed and has been sleeping easier.  Per Dr. Rosemarie: remote right PICA and left PCA infarcts in 1982 as well as left basal ganglia lacunar infarct in 2005 with mild residual  right sided weakness with recent increase complaints of worsening gait difficulties likely multifactorial due to combination of old strokes, poststroke parkinsonism and mild spinal stenosis and spondylosis. He also has mild cognitive impairment which appears quite stable   PAIN:  Are you having pain? No  FALLS: Has patient fallen in last 6 months?  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   COGNITION: Overall cognitive status: Within functional limits for tasks assessed and h/o MCI per Dr. Rosemarie Areas of impairment: Attention and Memory Comments:   MOTOR SPEECH: Overall motor speech: impaired Level of impairment: Word Respiration: thoracic breathing Phonation: breathy, hoarse, and low vocal intensity Resonance: WFL Articulation: Appears intact Intelligibility: Intelligibility reduced Motor planning: Appears intact Motor speech errors: aware Interfering components: n/a Effective technique: increased vocal intensity  ORAL MOTOR EXAMINATION: Overall status: WFL Comments:    OBJECTIVE VOICE ASSESSMENT: Sustained ah maximum phonation time: 15.92 seconds Sustained ah loudness average: 68 dB Oral reading (passage) loudness average: 67 dB Oral reading loudness range: 65-72 dB Conversational loudness average: 63 dB Conversational loudness range: 60- 65dB  Voice quality: hoarse, breathy, and low vocal intensity Stimulability trials: Given SLP modeling and occasional min cues, loudness average increased to 85dB at loud ahand 78dB at sentence level.  Comments:   Completed audio recording of patients baseline  voice without cueing from SLP: Yes  Pt does not report difficulty with swallowing which does not warrant further evaluation.  PATIENT REPORTED OUTCOME MEASURES (PROM): Communication Effectiveness Survey        How effective is your speech... Pt Rating  Having a conversation with a family member or friends at home 2  Participating in conversation with strangers in a quiet place  4  Conversing with a familiar person over the telephone 4  Conversing with a stranger over the telephone 3  Being part of a conversation in a noisy environment  1  Speaking to a friend when you are emotionally upset or angry 4  Having a conversation while traveling in the car 3  Having a conversation with someone at a distance 1  1= not at all effective 4= very effective                                                                                                                            TREATMENT DATE:   05/14/24: Targeted  improved volume and intelligibility using Speak Out! Lesson 2. Pt required occasional min verbal cues, modeling, for volume and breath support.Pt averages the following volume levels:  Sustained AH: 90 dB  Counting:88dB  Reading (phrases):   Cognitive Exercise: 84dB Required usual min to mod verbal cues, modeling for carryover of intent /volume generating 3 phrase descriptions following structured practice.  Reviewed requirement to watch the What is Parkinson's video, reviewed [employer] (PVP) website to locate the video and home practice sessions - he is to complete at least 1 online home practice session prior to next visit.   05/03/24: Target improving vocal quality and increasing intensity through progressively difficulty speech tasks using Speak Out! program, lesson 1. ST leads pt through exercises providing usual model prior to pt execution. occasional min-A required to achieve target dB this date and avoid volume decay. Averages this date:  Resonant vowels -- 87  dB Sustained ah --  90 dB, holds for average 8 seconds  Counting -- 89 dB Reading phrases --  87 dB Cognitive speech task -- 77 dB.  Conversational sample of approx 5 minutes, pt averages 74 dB with rare min-A. Discussed importance of daily home practice, ideally BID. Pt reports intent to practice using online practice.   03/29/24: Evaluation completed. Introduced Eusevio to [employer] (PVP) website. Demonstrated navigation to What is Parkinson video and on line practice sessions. Initiated training in Speak Out! Lesson 1 - after initial modeling and verbal instructions, Jerry achieved average of 85dB on loud Ah 5/5x, 81dB average on counting and 78dB average on reading 20/20 sentences. He required frequent mod A to carryover speaking with intent in conversation today. Requested his  spouse attend ST sessions. Requested access to [employer] on PVP website   PATIENT EDUCATION: Education details: See Treatment, See Patient Instructions, HEP, compensations for dysarthria Person educated: Patient Education method: Explanation, Demonstration, Verbal cues, and Handouts Education comprehension: returned demonstration, verbal cues required, and needs further education  HOME EXERCISE PROGRAM: SPEAK OUT!   GOALS: Goals reviewed with patient? Yes  SHORT TERM GOALS: Target date: 05/03/24   Pt will complete HEP daily with occasional min A Baseline: Goal status: ONGOING  2.  Pt will average 72dB and clear phonation 18/20 sentences Baseline:  Goal status: ONGOING  3.  Pt will complete 2 online practice sessions Baseline:  Goal status: ONGOING  4.  Pt will access E Library and complete 1 lesson from E Library Baseline:  Goal status: ONGOING  5.  Pt will average 70dB over 8 minute conversation with occasional min A Baseline:  Goal status: ONGOING    LONG TERM GOALS: Target date: 06/21/24  Pt will complete HEP twice daily 5/7 days with rare min A Baseline:  Goal status:  ONGOING  2.  Pt will average 70dB over 15 minute conversation with rare min A Baseline:  Goal status: ONGOING  3.  Pt will verbalize plan to continue Speak Out! Daily upon d/c from ST Baseline:  Goal status: ONGOING  4.  Pt will improve score on PROM Baseline:  Goal status: IONGOING  ASSESSMENT:  CLINICAL IMPRESSION: Patient is a 78 y.o. 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 He denies any s/s of dysphagia. He reports his wife often needs him to repeat himself and tells him he is speaking too low. Erikson is also aware that others have difficulty hearing him and he is having to repeat often.He has difficulty communicating over the phone. Voice is hoarse, intermittently aphonic. I recommend skilled ST to maximize intelligibility for safety, to reduce caregiver burden and improve social participation.  OBJECTIVE IMPAIRMENTS: Objective impairments include attention, memory, and dysarthria. These impairments are limiting patient from effectively communicating at home and in community.Factors affecting potential to achieve goals and functional outcome are medical prognosis.. Patient will benefit from skilled SLP services to address above impairments and improve overall function.  REHAB POTENTIAL: Good  PLAN:  SLP FREQUENCY: 1-2x/week  SLP DURATION: 12 weeks  PLANNED INTERVENTIONS: Aspiration precaution training, Diet toleration management , Language facilitation, Environmental controls, Cueing hierachy, Cognitive reorganization, Internal/external aids, Functional tasks, Multimodal communication approach, SLP instruction and feedback, Compensatory strategies, Patient/family education, [phone] Treatment of speech (30 or 45 min) , and [id number] Treatment of swallowing function    Agape Hardiman, Leita Caldron, CCC-SLP 05/14/2024, 3:42 PM

## 2024-05-14 NOTE — Patient Instructions (Signed)
 SABRA

## 2024-05-17 ENCOUNTER — Encounter: Admitting: Speech Pathology

## 2024-05-17 ENCOUNTER — Ambulatory Visit: Admitting: Occupational Therapy

## 2024-05-17 DIAGNOSIS — R2681 Unsteadiness on feet: Secondary | ICD-10-CM | POA: Diagnosis not present

## 2024-05-17 DIAGNOSIS — R29898 Other symptoms and signs involving the musculoskeletal system: Secondary | ICD-10-CM

## 2024-05-17 DIAGNOSIS — R278 Other lack of coordination: Secondary | ICD-10-CM

## 2024-05-17 DIAGNOSIS — R29818 Other symptoms and signs involving the nervous system: Secondary | ICD-10-CM | POA: Diagnosis not present

## 2024-05-17 DIAGNOSIS — R471 Dysarthria and anarthria: Secondary | ICD-10-CM | POA: Diagnosis not present

## 2024-05-17 DIAGNOSIS — M6281 Muscle weakness (generalized): Secondary | ICD-10-CM | POA: Diagnosis not present

## 2024-05-17 NOTE — Therapy (Addendum)
 OUTPATIENT OCCUPATIONAL THERAPY PARKINSON'S TREATMENT  Patient Name: Timothy Shields MRN: [id number] DOB:12-06-1945, 78 y.o., male Today's Date: 05/17/2024  PCP: Janey Santos, MD  REFERRING PROVIDER: Morita, Hokuto, MD  END OF SESSION:  OT End of Session - 05/17/24 1408     Visit Number 8    Number of Visits 17   max number of visits possible   Date for OT Re-Evaluation 06/22/24    Authorization Type Medicare    OT Start Time 1406    OT Stop Time 1452    OT Time Calculation (min) 46 min    Activity Tolerance Patient tolerated treatment well    Behavior During Therapy WFL for tasks assessed/performed          Past Medical History:  Diagnosis Date   Cerebrovascular disease    Headache(784.0)    Stroke (HCC)    3   Past Surgical History:  Procedure Laterality Date   HERNIA REPAIR     HERNIA REPAIR     Twice in the 1970's   Patient Active Problem List   Diagnosis Date Noted   Erectile dysfunction 09/13/2013   Other generalized ischemic cerebrovascular disease 07/10/2013   Other and unspecified hyperlipidemia 07/10/2013   ONSET DATE: 03/01/2024 (Date of referral)  REFERRING DIAG: G20.A1 (ICD-10-CM) - Parkinson's disease without dyskinesia, without mention of fluctuations  THERAPY DIAG:  Muscle weakness (generalized)  Other symptoms and signs involving the nervous system  Other symptoms and signs involving the musculoskeletal system  Other lack of coordination  Rationale for Evaluation and Treatment: Rehabilitation  SUBJECTIVE:   SUBJECTIVE STATEMENT: Pt reported that he has been doing better with keeping up with his PWR! exercises at home. He is not having any new pain today.   Pt accompanied by: self  PERTINENT HISTORY: Per Dr. Rosemarie: remote right PICA and left PCA infarcts in 1982 as well as left basal ganglia lacunar infarct in 2005 with mild residual right sided weakness with recent increase complaints of worsening gait difficulties likely  multifactorial due to combination of old strokes, poststroke parkinsonism and mild spinal stenosis and spondylosis. He also has mild cognitive impairment which appears quite stable     Patient has noticed increasing tremors in his left hand as well as some increase hypophonia and drooling.  He does have some difficulty at times getting up from the chair or wearing his clothes.  He was seen at [facility] neurolog[facility] for consultation and started on Sinemet  25/100 mg 1-1/2 tablets 3 times daily. He continues to ambulate with a wheeled walker and feels his balance is poor. He has had a few falls in fact 9 in the last several months fortunately no major injury.   Had a driving evaluation from [facility] and was told not to drive. Has had 9 falls in the past year. Goes to a Systems [employer] 3x a week.   PRECAUTIONS: Fall  WEIGHT BEARING RESTRICTIONS: No  PAIN:  Are you having pain? No  FALLS: Has patient fallen in last 6 months? Yes. Number of falls 9  LIVING ENVIRONMENT: Lives with: lives with their spouse, daughter lives close by and son is moving closer  Lives in: House/apartment - will soon be downsizing to an apartment  Stairs: Yes: External: 4 huge stone steps steps; on right going up Has following equipment at home: Single point cane, Walker - 4 wheeled, and Grab bars  PLOF: Independent with household mobility with device, Independent with community mobility with device, Vocation/Vocational requirements: Currently still working, Economist  of a Physiological scientist, and Leisure: collects books and baseball cards (has been collecting for 56 years); not driving (as of a few weeks ago)   PATIENT GOALS: PD management  OBJECTIVE:  Note: Objective measures were completed at Evaluation unless otherwise noted.  HAND DOMINANCE: Right  ADLs: Overall ADLs: mostly mod I; requires assistance with buttons, especially with sleeve cuffs  IADLs: Handwriting: 90% legible, Severe micrographia, and pt reports he has  always written small, but it has gotten even smaller. It is better if he thinks about what he is going to write and takes his time. There are times that he writes something down and then is unable to determine what he wrote.   MOBILITY STATUS: See PT notes  POSTURE COMMENTS:  rounded shoulders, forward head, and weight shift left, head and trunk tilted to the L   ACTIVITY TOLERANCE: Activity tolerance: good to fair  FUNCTIONAL OUTCOME MEASURES: 05/03/2024:Fastening/unfastening 3 buttons: 61 sec Physical performance test: PPT#2 (simulated eating) 22 sec & PPT#4 (donning/doffing jacket): 21 sec  COORDINATION: 05/03/2024: 9 Hole Peg test: Right: 37 sec; Left: 33 sec Box and Blocks:  Right 47blocks, Left 47blocks  UE ROM:  WFL  MUSCLE TONE: TBD  COGNITION: Overall cognitive status: History of cognitive impairments - at baseline  OBSERVATIONS: resting LUE tremor; impulsive; mild bradykinesia                                                                                                                   TREATMENT:  Pt received the following educational / self care handouts: -Keep thinking skills sharp -Ways to prevent future PD related complications -Memory compensatory strategies  Pt was receptive of information and verbalized understanding.   Therapeutic activities completed for the duration of the session: Pt participated in 2 games of Uzzle at level(s) 1 and 2 using RUE and LUE for eye-hand coordination and cognition by matching game pieces to visual patterns. Pt demonstrating ability to solve level(s) 1 and 2.   PATIENT EDUCATION: Education details: see above Person educated: Patient Education method: Explanation, Demonstration, Verbal cues, and Handouts Education comprehension: verbalized understanding, returned demonstration, verbal cues required, and needs further education  HOME EXERCISE PROGRAM: 03/23/2024: handwriting samples 04/19/2024: PWR! Hands 04/26/2024:  handwriting changes 05/01/2024: Coordination HEP 05/03/2024: buttoning 05/10/2024: Jacket handout 05/17/2024: Keeping Thinking Skills [place] handout, Memory strategies, preventing PD complications  GOALS:  SHORT TERM GOALS: Target date: 05/17/2024    Pt will be independent with PD specific HEP.  Baseline: not yet initiated Goal status: IN PROGRESS  2.  Pt will verbalize understanding of adapted strategies to maximize safety and independence with ADLs/IADLs.  Baseline: not yet initiated Goal status: IN PROGRESS  3.  Will set appropriate coordination based goal. Baseline: not yet initiated Goal status: MET  LONG TERM GOALS: Target date: 06/22/24    Pt will verbalize understanding of ways to prevent future PD related complications and PD community resources.  Baseline: not yet initiated Goal status: IN PROGRESS  2.  Pt will write  a short paragraph with no significant decrease in size and maintain 100% legibility.  Baseline: 90% legibility severe micrographia Goal status: IN PROGRESS  3.  Pt will verbalize understanding of ways to keep thinking skills sharp and ways to compensate for STM changes in the future.  Baseline: not yet initiated Goal status: INITIAL  4.  Pt will be able to place at least 5 additional blocks using R hand with completion of Box and Blocks test.  Baseline: 04/19/2024: Right - 42 blocks; Left - 49 blocks 05/03/2024: R - 47 blocks Goal status: MET   5.  Pt will demonstrate improved ease with fastening buttons as evidenced by decreasing 3 button/unbutton time by at least 5 seconds or more as appropriate.  Baseline: 04/19/2024: 42 seconds 05/03/2024: 61 seconds  Goal status: IN PROGRESS  6.  Pt will demonstrate improved fine motor coordination for ADLs as evidenced by decreasing 9 hole peg test score for each hand by at least 5 secs or more as appropriate.  Baseline: 04/19/2024: Right - 33 seconds; Left - 38 seconds 05/03/2024: 9 Hole Peg test: Right: 37  sec; Left: 33 sec Goal status: INITIAL   7.  Pt will demonstrate increased ease with dressing as evidenced by decreasing PPT#4 (don/ doff jacket) to 15 secs or less. Baseline: 21 sec Goal status: INITIAL  8.  Pt will demonstrate improved ease with feeding as evidenced by decreasing PPT#2 by at least 5 seconds.  Baseline: 22 seconds Goal status: MET (05/14/24: 13 sec)   ASSESSMENT:  CLINICAL IMPRESSION: Pt presents with performance deficits as indicated below due to parkinson's disease. He demonstrates good rehab potential as evidence to follow through with HEP and PWR! exercises. He will continue to benefit from skilled outpatient OT to improve management of PD and functional independence for ADLs and IADLs.   PERFORMANCE DEFICITS: in functional skills including ADLs, IADLs, coordination, ROM, Fine motor control, Gross motor control, mobility, balance, decreased knowledge of precautions, decreased knowledge of use of DME, and UE functional use.   IMPAIRMENTS: are limiting patient from ADLs, IADLs, rest and sleep, leisure, and social participation.   COMORBIDITIES:  may have co-morbidities  that affects occupational performance. Patient will benefit from skilled OT to address above impairments and improve overall function.  REHAB POTENTIAL: Good  PLAN:  OT FREQUENCY: 2x/week  OT DURATION: 8 weeks  PLANNED INTERVENTIONS: 97168 OT Re-evaluation, 97535 self care/ADL training, 02889 therapeutic exercise, 97530 therapeutic activity, 97112 neuromuscular re-education, functional mobility training, coping strategies training, patient/family education, and DME and/or AE instructions  RECOMMENDED OTHER SERVICES: N/A for this visit  CONSULTED AND AGREED WITH PLAN OF CARE: Patient  PLAN FOR NEXT SESSION:  Check status of pt's visit as scheduled for next Thursday with Geny* Encourage pt to speak LOUDLY Progress towards goals; pt to bring in 3 ring binder and all ex's next visit to organize  and issue PD exercise chart Community resources information  Sharron Petruska, Student-OT 05/17/2024, 3:02 PM

## 2024-05-17 NOTE — Patient Instructions (Signed)
 Keeping Thinking Skills Sharp:  1. Jigsaw puzzles  2. Card/board games  3. Talking on the phone/going to social events  4. [email or web address]  5. Online games  6. Word searches/crossword puzzles  7.  Logic puzzles  8. Aerobic exercise (stationary bike)  9. Eating balanced diet (fruits & veggies)  10. Drink water  11. Try something new--new recipe, hobby  12. Crafts  13. Do a variety of activities that are challenging  14.  Plan weekly meals and write a grocery list  15. Add cognitive activities to walking/exercising (think of animal/food/city with each letter of the alphabet, counting backwards, thinking of as many vegetables as you can, etc.).--Only do this if safe (no freezing/falls). Ways to prevent future Parkinson's related complications:  1.   Exercise regularly/daily. Challenge yourself SAFELY and try to get different types of exercise including: PWR! Moves, Cardio (cycling), strength and balance training, and stretches (Yoga)   2.   Focus on BIGGER movements during daily activities- really reach overhead, straighten elbows and extend fingers  3.   When dressing (especially jacket/coat) or reaching for your seatbelt make sure to use your body to assist by twisting while you reach and looking at where you are reaching - this can help to minimize stress on the shoulder and reduce the risk of a rotator cuff tear  4.   Swing your arms when you walk (unless using walker)! People with PD are at increased risk for frozen shoulder and swinging your arms can reduce this risk.  5. Drink plenty of water and eat a high fiber diet (fresh fruits and veggies, nuts/seeds, beans, some fish). Avoid canned foods, reduce sugar intake and dairy when possible  6. Do NOT take your Parkinson's medication around meals (take at least 30 min before a meal, or 1 hour after a meal), especially protein as it blocks the absorption of the medicine and will not work effectively  7. Keep your feet apart  when you are standing (wider stance) to allow you to have better balance and to reach further with your arms. Also make sure your feet are apart before standing up.   8. Stay social! - Go out with friends, play card games with others, join a community group or community PD exercise class  9. Get a good night's sleep! Staying active during the day and developing a good bedtime routine can help with this.   10. Communicate with your neurologist any concerns and maintain overall health - keep blood pressure regulated, reduce stress/anxiety, good nutrition/gut health, etc.  Memory Compensation Strategies  Use "WARM" strategy. W= write it down A=  associate it R=  repeat it M=  make a mental picture  You can keep a Memory Notebook. Use a 3-ring notebook with sections for the following:  calendar, important names and phone numbers, medications, doctors' names/phone numbers, "to do list"/reminders, and a section to journal what you did each day  Use a calendar to write appointments down.  Write yourself a schedule for the day.  This can be placed on the calendar or in a separate section of the Memory Notebook.  Keeping a regular schedule can help memory.  Use medication organizer with sections for each day or morning/evening pills  You may need help loading it  Keep a basket, or pegboard by the door.   Place items that you need to take out with you in the basket or on the pegboard.  You may also want to include a message board for  reminders.  Use sticky notes. Place sticky notes with reminders in a place where the task is performed.  For example:  "turn off the stove" placed by the stove, "lock the door" placed on the door at eye level, "take your medications" on the bathroom mirror or by the place where you normally take your medications  Use alarms, timers, and/or a reminder app. Use while cooking to remind yourself to check on food or as a reminder to take your medicine, or as a reminder  to make a call, or as a reminder to perform another task, etc.  Use a voice recorder app or small tape recorder to record important information and notes for yourself. Go back at the end of the day and listen to these.

## 2024-05-21 ENCOUNTER — Ambulatory Visit: Admitting: Speech Pathology

## 2024-05-21 ENCOUNTER — Encounter: Payer: Self-pay | Admitting: Occupational Therapy

## 2024-05-21 ENCOUNTER — Ambulatory Visit: Admitting: Occupational Therapy

## 2024-05-21 DIAGNOSIS — M6281 Muscle weakness (generalized): Secondary | ICD-10-CM

## 2024-05-21 DIAGNOSIS — R29818 Other symptoms and signs involving the nervous system: Secondary | ICD-10-CM

## 2024-05-21 DIAGNOSIS — R2681 Unsteadiness on feet: Secondary | ICD-10-CM | POA: Diagnosis not present

## 2024-05-21 DIAGNOSIS — R278 Other lack of coordination: Secondary | ICD-10-CM | POA: Diagnosis not present

## 2024-05-21 DIAGNOSIS — R471 Dysarthria and anarthria: Secondary | ICD-10-CM | POA: Diagnosis not present

## 2024-05-21 DIAGNOSIS — R29898 Other symptoms and signs involving the musculoskeletal system: Secondary | ICD-10-CM | POA: Diagnosis not present

## 2024-05-21 NOTE — Therapy (Signed)
 OUTPATIENT SPEECH LANGUAGE PATHOLOGY TREATMENT NOTE   Patient Name: Timothy Shields MRN: [id number] DOB:09/06/1946, 78 y.o., male Today's Date: 05/21/2024  PCP: Janey Santos, MD REFERRING PROVIDER: Morita, Hokuto, MD  END OF SESSION:  End of Session - 05/21/24 1408     Visit Number 4    Number of Visits 25    Date for SLP Re-Evaluation 06/21/24    SLP Start Time 1405    SLP Stop Time  1445    SLP Time Calculation (min) 40 min    Activity Tolerance Patient tolerated treatment well           Past Medical History:  Diagnosis Date   Cerebrovascular disease    Headache(784.0)    Stroke (HCC)    3   Past Surgical History:  Procedure Laterality Date   HERNIA REPAIR     HERNIA REPAIR     Twice in the 1970's   Patient Active Problem List   Diagnosis Date Noted   Erectile dysfunction 09/13/2013   Other generalized ischemic cerebrovascular disease 07/10/2013   Other and unspecified hyperlipidemia 07/10/2013    ONSET DATE: 03/01/2024 (referral) PD sx 2-3 years ago  REFERRING DIAG:  Diagnosis  G20.A1 (ICD-10-CM) - Parkinson's disease without dyskinesia, without mention of fluctuations    THERAPY DIAG:  Dysarthria and anarthria  Rationale for Evaluation and Treatment: Rehabilitation  SUBJECTIVE:   SUBJECTIVE STATEMENT: Some days the loudest I can talk is barely understandable  Pt accompanied by: self  PERTINENT HISTORY: Saw a PD specialist at [facility] and reports he has a 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 Prescribed him carbidopa  levodopa  3 and 1/2 tabs for 3 times a day. Symptoms started 2-3 years ago. Started to get the tremors last year. Reports that after starting Carbidopa  he has been able to turn easier in the bed and has been sleeping easier.  Per Dr. Rosemarie: remote right PICA and left PCA infarcts in 1982 as well as left basal ganglia lacunar infarct in 2005 with mild residual  right sided weakness with recent increase complaints of worsening gait difficulties likely multifactorial due to combination of old strokes, poststroke parkinsonism and mild spinal stenosis and spondylosis. He also has mild cognitive impairment which appears quite stable   PAIN:  Are you having pain? No  FALLS: Has patient fallen in last 6 months?  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   COGNITION: Overall cognitive status: Within functional limits for tasks assessed and h/o MCI per Dr. Rosemarie Areas of impairment: Attention and Memory Comments:   MOTOR SPEECH: Overall motor speech: impaired Level of impairment: Word Respiration: thoracic breathing Phonation: breathy, hoarse, and low vocal intensity Resonance: WFL Articulation: Appears intact Intelligibility: Intelligibility reduced Motor planning: Appears intact Motor speech errors: aware Interfering components: n/a Effective technique: increased vocal intensity  ORAL MOTOR EXAMINATION: Overall status: WFL Comments:    OBJECTIVE VOICE ASSESSMENT: Sustained ah maximum phonation time: 15.92 seconds Sustained ah loudness average: 68 dB Oral reading (passage) loudness average: 67 dB Oral reading loudness range: 65-72 dB Conversational loudness average: 63 dB Conversational loudness range: 60- 65dB  Voice quality: hoarse, breathy, and low vocal intensity Stimulability trials: Given SLP modeling and occasional min cues, loudness average increased to 85dB at loud ahand 78dB at sentence level.  Comments:   Completed audio recording of patients baseline  voice without cueing from SLP: Yes  Pt does not report difficulty with swallowing which does not warrant further evaluation.  PATIENT REPORTED OUTCOME MEASURES (PROM): Communication Effectiveness Survey        How effective is your speech... Pt Rating  Having a conversation with a family member or friends at home 2  Participating in conversation with strangers in a quiet place  4  Conversing with a familiar person over the telephone 4  Conversing with a stranger over the telephone 3  Being part of a conversation in a noisy environment  1  Speaking to a friend when you are emotionally upset or angry 4  Having a conversation while traveling in the car 3  Having a conversation with someone at a distance 1  1= not at all effective 4= very effective                                                                                                                            TREATMENT DATE:   05/21/24: [name] has not found his workbook nor accessed the E Library or online home practice session , there fore HEP not completed. Today, we completed the SPEAK OUT exercises - loud Ah averaged 86dB with rare min A, glides averaged   83dB; Counting 84dB with rare min modeling and gesture cues. Reading using E [employer], Breckan used intent to average 84dB with rare min A - in conversation task and unstructured conversation he requires usual min to mod verbal and gesture cues to speak with intent and average 72dB. He has not watched the What is Parkinson's video - I requested spouse attend sessions, he states she is unable to attend due to her schedule. Role play ordering at restaurant his beer and order at [facility] with rare min A to order with intent.   05/14/24: Targeted  improved volume and intelligibility using Speak Out! Lesson 2. Pt required occasional min verbal cues, modeling, for volume and breath support.Pt averages the following volume levels:  Sustained AH: 90  dB  Counting:88dB  Reading (phrases):   Cognitive Exercise: 84dB Required usual min to mod verbal cues, modeling for carryover of intent /volume generating 3 phrase descriptions following structured practice.  Reviewed requirement to watch the What is Parkinson's video, reviewed [facility] [employer] (PVP) website to locate the video and home practice sessions - he is to complete at least 1 online home practice session prior to next visit.   05/03/24: Target improving vocal quality and increasing intensity through progressively difficulty speech tasks using Speak Out! program, lesson 1. ST leads pt through exercises providing usual model prior to pt execution. occasional min-A required to achieve target dB this date and  avoid volume decay. Averages this date:  Resonant vowels -- 87 dB Sustained ah --  90 dB, holds for average 8 seconds  Counting -- 89 dB Reading phrases --  87 dB Cognitive speech task -- 77 dB.  Conversational sample of approx 5 minutes, pt averages 74 dB with rare min-A. Discussed importance of daily home practice, ideally BID. Pt reports intent to practice using online practice.   03/29/24: Evaluation completed. Introduced Nicklas to [employer] (PVP) website. Demonstrated navigation to What is Parkinson video and on line practice sessions. Initiated training in Speak Out! Lesson 1 - after initial modeling and verbal instructions, Sascha achieved average of 85dB on loud Ah 5/5x, 81dB average on counting and 78dB average on reading 20/20 sentences. He required frequent mod A to carryover speaking with intent in conversation today. Requested his spouse attend ST sessions. Requested access to [employer] on PVP website   PATIENT EDUCATION: Education details: See Treatment, See Patient Instructions, HEP, compensations for dysarthria Person educated: Patient Education method: Explanation, Demonstration, Verbal cues, and Handouts Education comprehension: returned  demonstration, verbal cues required, and needs further education  HOME EXERCISE PROGRAM: SPEAK OUT!   GOALS: Goals reviewed with patient? Yes  SHORT TERM GOALS: Target date: 05/03/24   Pt will complete HEP daily with occasional min A Baseline: Goal status: NOT MET  2.  Pt will average 72dB and clear phonation 18/20 sentences Baseline:  Goal status: ONGOING  3.  Pt will complete 2 online practice sessions Baseline:  Goal status: NOT MET  4.  Pt will access E Library and complete 1 lesson from E Library Baseline:  Goal status: NOT MET  5.  Pt will average 70dB over 8 minute conversation with occasional min A Baseline:  Goal status: NOT MET    LONG TERM GOALS: Target date: 06/21/24  Pt will complete HEP twice daily 5/7 days with rare min A Baseline:  Goal status: ONGOING  2.  Pt will average 70dB over 15 minute conversation with rare min A Baseline:  Goal status: ONGOING  3.  Pt will verbalize plan to continue Speak Out! Daily upon d/c from ST Baseline:  Goal status: ONGOING  4.  Pt will improve score on PROM Baseline:  Goal status: IONGOING  ASSESSMENT:  CLINICAL IMPRESSION: Patient is a 78 y.o. 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 He denies any s/s of dysphagia. He reports his wife often needs him to repeat himself and tells him he is speaking too low. Kelvin is also aware that others have difficulty hearing him and he is having to repeat often.He has difficulty communicating over the phone. Voice is hoarse, intermittently aphonic. I recommend skilled ST to maximize intelligibility for safety, to reduce caregiver burden and improve social participation.  OBJECTIVE IMPAIRMENTS: Objective impairments include attention, memory, and dysarthria. These impairments are limiting patient from effectively communicating at home and in community.Factors affecting potential to achieve goals and  functional outcome are medical prognosis.. Patient will benefit from skilled SLP services to address above impairments and improve overall function.  REHAB POTENTIAL: Good  PLAN:  SLP FREQUENCY: 1-2x/week  SLP DURATION: 12 weeks  PLANNED INTERVENTIONS: Aspiration precaution training, Diet toleration management , Language facilitation, Environmental controls, Cueing hierachy, Cognitive reorganization, Internal/external aids, Functional tasks, Multimodal communication approach, SLP instruction and feedback, Compensatory strategies, Patient/family education, [id number] Treatment of speech (30 or 45 min) , and 07473 Treatment of swallowing  function    Josealberto Montalto, Leita Caldron, CCC-SLP 05/21/2024, 3:40 PM

## 2024-05-21 NOTE — Patient Instructions (Addendum)
   [name] Lipps L-E-V-Y  2046/08/11  [address] [place] [address]  State your E mail with intent - then spell your email with intent  State your cell phone with intent  I'll have a [place] amber lager please  I'll have a glass of red wine please  May I have the tuna please  Practice your orders before you go out

## 2024-05-21 NOTE — Patient Instructions (Signed)
(  Exercise) Monday Tuesday Wednesday Thursday Friday Saturday Sunday   PWR! Supine           PWR! Standing           P.T. ex's           PWR! Sitting           PWR! Hands           Coordination           Speech ex's           Personal trainer

## 2024-05-21 NOTE — Therapy (Signed)
 OUTPATIENT OCCUPATIONAL THERAPY PARKINSON'S TREATMENT  Patient Name: Timothy Shields MRN: [id number] DOB:Aug 04, 1946, 78 y.o., male Today's Date: 05/21/2024  PCP: Janey Santos, MD  REFERRING PROVIDER: Morita, Hokuto, MD   END OF SESSION:  OT End of Session - 05/21/24 1323     Visit Number 9    Number of Visits 17   max number of visits possible   Date for OT Re-Evaluation 06/22/24    Authorization Type Medicare    OT Start Time 1320    OT Stop Time 1402    OT Time Calculation (min) 42 min    Activity Tolerance Patient tolerated treatment well    Behavior During Therapy WFL for tasks assessed/performed          Past Medical History:  Diagnosis Date   Cerebrovascular disease    Headache(784.0)    Stroke (HCC)    3   Past Surgical History:  Procedure Laterality Date   HERNIA REPAIR     HERNIA REPAIR     Twice in the 1970's   Patient Active Problem List   Diagnosis Date Noted   Erectile dysfunction 09/13/2013   Other generalized ischemic cerebrovascular disease 07/10/2013   Other and unspecified hyperlipidemia 07/10/2013   ONSET DATE: 03/01/2024 (Date of referral)  REFERRING DIAG: G20.A1 (ICD-10-CM) - Parkinson's disease without dyskinesia, without mention of fluctuations  THERAPY DIAG:  Muscle weakness (generalized)  Other symptoms and signs involving the nervous system  Other symptoms and signs involving the musculoskeletal system  Other lack of coordination  Unsteadiness on feet  Rationale for Evaluation and Treatment: Rehabilitation  SUBJECTIVE:   SUBJECTIVE STATEMENT: Pt has chronic Rt hip and Rt shoulder pain, Lt shoulder pain for a few months  Pt accompanied by: self  PERTINENT HISTORY: Per Dr. Rosemarie: remote right PICA and left PCA infarcts in 1982 as well as left basal ganglia lacunar infarct in 2005 with mild residual right sided weakness with recent increase complaints of worsening gait difficulties likely multifactorial due to  combination of old strokes, poststroke parkinsonism and mild spinal stenosis and spondylosis. He also has mild cognitive impairment which appears quite stable     Patient has noticed increasing tremors in his left hand as well as some increase hypophonia and drooling.  He does have some difficulty at times getting up from the chair or wearing his clothes.  He was seen at [facility] neurology for consultation and started on Sinemet  25/100 mg 1-1/2 tablets 3 times daily. He continues to ambulate with a wheeled walker and feels his balance is poor. He has had a few falls in fact 9 in the last several months fortunately no major injury.   Had a driving evaluation from [facility] and was told not to drive. Has had 9 falls in the past year. Goes to a Systems analyst 3x a week.   PRECAUTIONS: Fall  WEIGHT BEARING RESTRICTIONS: No  PAIN:  Are you having pain? Pt reports chronic Rt shoulder pain but more recent Lt shoulder pain 0/10 at rest, up to 7/10 with certain activities (raising sheets)   FALLS: Has patient fallen in last 6 months? Yes. Number of falls 9  LIVING ENVIRONMENT: Lives with: lives with their spouse, daughter lives close by and son is moving closer  Lives in: House/apartment - will soon be downsizing to an apartment  Stairs: Yes: External: 4 huge stone steps steps; on right going up Has following equipment at home: Single point cane, Walker - 4 wheeled, and Grab bars  PLOF:  Independent with household mobility with device, Independent with community mobility with device, Vocation/Vocational requirements: Currently still working, Theatre manager a Physiological scientist, and Leisure: collects books and baseball cards (has been collecting for 56 years); not driving (as of a few weeks ago)   PATIENT GOALS: PD management  OBJECTIVE:  Note: Objective measures were completed at Evaluation unless otherwise noted.  HAND DOMINANCE: Right  ADLs: Overall ADLs: mostly mod I; requires assistance with  buttons, especially with sleeve cuffs  IADLs: Handwriting: 90% legible, Severe micrographia, and pt reports he has always written small, but it has gotten even smaller. It is better if he thinks about what he is going to write and takes his time. There are times that he writes something down and then is unable to determine what he wrote.   MOBILITY STATUS: See PT notes  POSTURE COMMENTS:  rounded shoulders, forward head, and weight shift left, head and trunk tilted to the L   ACTIVITY TOLERANCE: Activity tolerance: good to fair  FUNCTIONAL OUTCOME MEASURES: 05/03/2024:Fastening/unfastening 3 buttons: 61 sec Physical performance test: PPT#2 (simulated eating) 22 sec & PPT#4 (donning/doffing jacket): 21 sec  COORDINATION: 05/03/2024: 9 Hole Peg test: Right: 37 sec; Left: 33 sec Box and Blocks:  Right 47blocks, Left 47blocks  UE ROM:  WFL  MUSCLE TONE: TBD  COGNITION: Overall cognitive status: History of cognitive impairments - at baseline  OBSERVATIONS: resting LUE tremor; impulsive; mild bradykinesia                                                                                                                   TREATMENT:   Pt brought in 3 ring binder and all previously issued handouts and exercises - therapist helped pt put in correct order for organization purposes and greater carryover of HEP's at home.  Therapist then developed PD exercise chart for pt and instructed which days to perform which ex's  PATIENT EDUCATION: Education details: see above Person educated: Patient Education method: Explanation, Demonstration, Verbal cues, and Handouts Education comprehension: verbalized understanding, returned demonstration, and verbal cues required  HOME EXERCISE PROGRAM: 03/23/2024: handwriting samples 04/19/2024: PWR! Hands 04/26/2024: handwriting changes 05/01/2024: Coordination HEP 05/03/2024: buttoning 05/10/2024: Jacket handout 05/17/2024: Keeping Thinking Skills [place]a  handout, Memory strategies, preventing PD complications 05/21/24: PD ex chart  GOALS:  SHORT TERM GOALS: Target date: 05/17/2024    Pt will be independent with PD specific HEP.  Baseline: not yet initiated Goal status: IN PROGRESS  2.  Pt will verbalize understanding of adapted strategies to maximize safety and independence with ADLs/IADLs.  Baseline: not yet initiated Goal status: IN PROGRESS  3.  Will set appropriate coordination based goal. Baseline: not yet initiated Goal status: MET  LONG TERM GOALS: Target date: 06/22/24    Pt will verbalize understanding of ways to prevent future PD related complications and PD community resources.  Baseline: not yet initiated Goal status: IN PROGRESS  2.  Pt will write a short paragraph with no significant decrease in size and maintain 100% legibility.  Baseline: 90% legibility severe micrographia Goal status: IN PROGRESS  3.  Pt will verbalize understanding of ways to keep thinking skills sharp and ways to compensate for STM changes in the future.  Baseline: not yet initiated Goal status: INITIAL  4.  Pt will be able to place at least 5 additional blocks using R hand with completion of Box and Blocks test.  Baseline: 04/19/2024: Right - 42 blocks; Left - 49 blocks 05/03/2024: R - 47 blocks Goal status: MET   5.  Pt will demonstrate improved ease with fastening buttons as evidenced by decreasing 3 button/unbutton time by at least 5 seconds or more as appropriate.  Baseline: 04/19/2024: 42 seconds 05/03/2024: 61 seconds  Goal status: IN PROGRESS  6.  Pt will demonstrate improved fine motor coordination for ADLs as evidenced by decreasing 9 hole peg test score for each hand by at least 5 secs or more as appropriate.  Baseline: 04/19/2024: Right - 33 seconds; Left - 38 seconds 05/03/2024: 9 Hole Peg test: Right: 37 sec; Left: 33 sec Goal status: INITIAL   7.  Pt will demonstrate increased ease with dressing as evidenced by  decreasing PPT#4 (don/ doff jacket) to 15 secs or less. Baseline: 21 sec Goal status: INITIAL  8.  Pt will demonstrate improved ease with feeding as evidenced by decreasing PPT#2 by at least 5 seconds.  Baseline: 22 seconds Goal status: MET (05/14/24: 13 sec)   ASSESSMENT:  CLINICAL IMPRESSION: Pt presents with performance deficits as indicated below due to parkinson's disease. He demonstrates good rehab potential as evidence to follow through with HEP and PWR! exercises. He will continue to benefit from skilled outpatient OT to improve management of PD and functional independence for ADLs and IADLs.   PERFORMANCE DEFICITS: in functional skills including ADLs, IADLs, coordination, ROM, Fine motor control, Gross motor control, mobility, balance, decreased knowledge of precautions, decreased knowledge of use of DME, and UE functional use.   IMPAIRMENTS: are limiting patient from ADLs, IADLs, rest and sleep, leisure, and social participation.   COMORBIDITIES:  may have co-morbidities  that affects occupational performance. Patient will benefit from skilled OT to address above impairments and improve overall function.  REHAB POTENTIAL: Good  PLAN:  OT FREQUENCY: 2x/week  OT DURATION: 8 weeks  PLANNED INTERVENTIONS: 97168 OT Re-evaluation, 97535 self care/ADL training, 02889 therapeutic exercise, 97530 therapeutic activity, 97112 neuromuscular re-education, functional mobility training, coping strategies training, patient/family education, and DME and/or AE instructions  RECOMMENDED OTHER SERVICES: N/A for this visit  CONSULTED AND AGREED WITH PLAN OF CARE: Patient  PLAN FOR NEXT SESSION:   Encourage pt to speak LOUDLY Progress towards goals; issue PWR! Supine  Burnard JINNY Roads, OT 05/21/2024, 2:42 PM

## 2024-05-24 ENCOUNTER — Ambulatory Visit: Admitting: Occupational Therapy

## 2024-05-24 ENCOUNTER — Encounter: Admitting: Speech Pathology

## 2024-05-28 ENCOUNTER — Ambulatory Visit: Admitting: Speech Pathology

## 2024-05-28 ENCOUNTER — Encounter: Admitting: Occupational Therapy

## 2024-05-28 ENCOUNTER — Encounter: Payer: Self-pay | Admitting: Speech Pathology

## 2024-05-28 DIAGNOSIS — R2681 Unsteadiness on feet: Secondary | ICD-10-CM | POA: Diagnosis not present

## 2024-05-28 DIAGNOSIS — R471 Dysarthria and anarthria: Secondary | ICD-10-CM

## 2024-05-28 DIAGNOSIS — R29898 Other symptoms and signs involving the musculoskeletal system: Secondary | ICD-10-CM | POA: Diagnosis not present

## 2024-05-28 DIAGNOSIS — R278 Other lack of coordination: Secondary | ICD-10-CM | POA: Diagnosis not present

## 2024-05-28 DIAGNOSIS — M6281 Muscle weakness (generalized): Secondary | ICD-10-CM | POA: Diagnosis not present

## 2024-05-28 DIAGNOSIS — R29818 Other symptoms and signs involving the nervous system: Secondary | ICD-10-CM | POA: Diagnosis not present

## 2024-05-28 NOTE — Therapy (Signed)
 OUTPATIENT SPEECH LANGUAGE PATHOLOGY TREATMENT NOTE   Patient Name: Timothy Shields MRN: [id number] DOB:Mar 02, 1946, 78 y.o., male Today's Date: 05/28/2024  PCP: Janey Santos, MD REFERRING PROVIDER: Morita, Hokuto, MD  END OF SESSION:  End of Session - 05/28/24 1421     Visit Number 5    Number of Visits 25    Date for Recertification  06/21/24    SLP Start Time 1415   Lemond did not sign in until 1414   SLP Stop Time  1445    SLP Time Calculation (min) 30 min    Activity Tolerance Patient tolerated treatment well           Past Medical History:  Diagnosis Date   Cerebrovascular disease    Headache(784.0)    Stroke (HCC)    3   Past Surgical History:  Procedure Laterality Date   HERNIA REPAIR     HERNIA REPAIR     Twice in the 1970's   Patient Active Problem List   Diagnosis Date Noted   Erectile dysfunction 09/13/2013   Other generalized ischemic cerebrovascular disease 07/10/2013   Other and unspecified hyperlipidemia 07/10/2013    ONSET DATE: 03/01/2024 (referral) PD sx 2-3 years ago  REFERRING DIAG:  Diagnosis  G20.A1 (ICD-10-CM) - Parkinson's disease without dyskinesia, without mention of fluctuations    THERAPY DIAG:  Dysarthria and anarthria  Rationale for Evaluation and Treatment: Rehabilitation  SUBJECTIVE:   SUBJECTIVE STATEMENT: I went to dinner with friends and one said my voice sounds better   Pt accompanied by: self  PERTINENT HISTORY: Saw a PD specialist at [facility] and reports 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 Prescribed him carbidopa  levodopa  3 and 1/2 tabs for 3 times a day. Symptoms started 2-3 years ago. Started to get the tremors last year. Reports that after starting Carbidopa  he has been able to turn easier in the bed and has been sleeping easier.  Per Dr. Rosemarie: remote right PICA and left PCA infarcts in 1982 as well as left basal  ganglia lacunar infarct in 2005 with mild residual right sided weakness with recent increase complaints of worsening gait difficulties likely multifactorial due to combination of old strokes, poststroke parkinsonism and mild spinal stenosis and spondylosis. He also has mild cognitive impairment which appears quite stable   PAIN:  Are you having pain? No  FALLS: Has patient fallen in last 6 months?  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 Pt Rating  Having a conversation with a family member or friends at home 2  Participating in conversation with strangers in a quiet place  4  Conversing with a familiar person over the telephone 4  Conversing with a stranger over the telephone 3  Being part of a conversation in a noisy environment  1  Speaking to a friend when you are emotionally upset or angry 4  Having a conversation while traveling in the car 3  Having a conversation with someone at a distance 1  1= not at all effective 4= very  effective                                                                                                                            TREATMENT DATE:   05/28/24: Muaz has not found his workbook yet. He practices with reading and SPEAK OUT! Exercises from memory. Today, we complete SPEAK OUT exercises (syllables to counting) with rare min visual cues. Sustained ah averaged 92dB, glide 85dB, counting 84dB. Reading aloud 4 words and explaining which does go and why with occasional min A to use intent to average 77dB. In simple conversation he required occasional  gesture cues and verbal cues to maintain intentional speech and average 74dB   05/21/24: Yacqub has not found his workbook nor accessed the E Library or online home practice session , there fore HEP not completed. Today, we completed the SPEAK OUT exercises - loud Ah averaged 86dB with rare min A, glides averaged   83dB; Counting 84dB with rare min modeling and gesture cues. Reading using E [employer], Daquan used intent to average 84dB with rare min A - in conversation task and unstructured conversation he requires usual min to mod verbal and gesture cues to speak with intent and average 72dB. He has not watched the What is Parkinson's video - I requested spouse attend sessions, he states she is unable to attend due to her schedule. Role play ordering at restaurant his beer and order at [facility] with rare min A to order with intent.   05/14/24: Targeted  improved volume and intelligibility using Speak Out! Lesson 2. Pt required occasional min verbal cues, modeling, for volume and breath support.Pt averages the following volume levels:  Sustained AH: 90 dB  Counting:88dB  Reading (phrases):   Cognitive Exercise: 84dB Required usual min to mod verbal cues, modeling for carryover of intent /volume generating 3 phrase descriptions following structured practice.  Reviewed requirement to watch the What is Parkinson's video, reviewed [employer] (PVP) website to locate the video and home practice sessions - he is to complete at least 1 online home practice session prior to next visit.   05/03/24: Target improving vocal quality and increasing intensity through progressively difficulty speech tasks using Speak Out! program, lesson 1. ST leads pt through exercises providing usual model prior to pt execution. occasional min-A required to achieve target dB this date and avoid volume decay. Averages this date:  Resonant vowels -- 87 dB Sustained ah --  90 dB, holds  for average 8 seconds  Counting -- 89 dB Reading phrases --  87 dB Cognitive speech task -- 77 dB.  Conversational sample of approx 5 minutes, pt averages 74 dB with rare min-A. Discussed importance of daily home practice, ideally BID. Pt reports intent to practice using online practice.   03/29/24: Evaluation completed. Introduced Teddie to [employer] (PVP) website. Demonstrated navigation to What is Parkinson video and on line practice sessions. Initiated training in  Speak Out! Lesson 1 - after initial modeling and verbal instructions, Orazio achieved average of 85dB on loud Ah 5/5x, 81dB average on counting and 78dB average on reading 20/20 sentences. He required frequent mod A to carryover speaking with intent in conversation today. Requested his spouse attend ST sessions. Requested access to [employer] on PVP website   PATIENT EDUCATION: Education details: See Treatment, See Patient Instructions, HEP, compensations for dysarthria Person educated: Patient Education method: Explanation, Demonstration, Verbal cues, and Handouts Education comprehension: returned demonstration, verbal cues required, and needs further education  HOME EXERCISE PROGRAM: SPEAK OUT!   GOALS: Goals reviewed with patient? Yes  SHORT TERM GOALS: Target date: 05/03/24   Pt will complete HEP daily with occasional min A Baseline: Goal status: NOT MET  2.  Pt will average 72dB and clear phonation 18/20 sentences Baseline:  Goal status: ONGOING  3.  Pt will complete 2 online practice sessions Baseline:  Goal status: NOT MET  4.  Pt will access E Library and complete 1 lesson from E Library Baseline:  Goal status: NOT MET  5.  Pt will average 70dB over 8 minute conversation with occasional min A Baseline:  Goal status: NOT MET    LONG TERM GOALS: Target date: 06/21/24  Pt will complete HEP twice daily 5/7 days with rare min A Baseline:  Goal status: ONGOING  2.  Pt will average 70dB  over 15 minute conversation with rare min A Baseline:  Goal status: ONGOING  3.  Pt will verbalize plan to continue Speak Out! Daily upon d/c from ST Baseline:  Goal status: ONGOING  4.  Pt will improve score on PROM Baseline:  Goal status: IONGOING  ASSESSMENT:  CLINICAL IMPRESSION: Patient is a 78 y.o. 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 He denies any s/s of dysphagia. He reports his wife often needs him to repeat himself and tells him he is speaking too low. Bairon is also aware that others have difficulty hearing him and he is having to repeat often.He has difficulty communicating over the phone. Voice is hoarse, intermittently aphonic. I recommend skilled ST to maximize intelligibility for safety, to reduce caregiver burden and improve social participation.  OBJECTIVE IMPAIRMENTS: Objective impairments include attention, memory, and dysarthria. These impairments are limiting patient from effectively communicating at home and in community.Factors affecting potential to achieve goals and functional outcome are medical prognosis.. Patient will benefit from skilled SLP services to address above impairments and improve overall function.  REHAB POTENTIAL: Good  PLAN:  SLP FREQUENCY: 1-2x/week  SLP DURATION: 12 weeks  PLANNED INTERVENTIONS: Aspiration precaution training, Diet toleration management , Language facilitation, Environmental controls, Cueing hierachy, Cognitive reorganization, Internal/external aids, Functional tasks, Multimodal communication approach, SLP instruction and feedback, Compensatory strategies, Patient/family education, [phone] Treatment of speech (30 or 45 min) , and 07473 Treatment of swallowing function    Nanea Jared, Leita Caldron, CCC-SLP 05/28/2024, 3:39 PM

## 2024-05-31 ENCOUNTER — Encounter: Admitting: Speech Pathology

## 2024-05-31 ENCOUNTER — Ambulatory Visit: Admitting: Occupational Therapy

## 2024-05-31 DIAGNOSIS — M6281 Muscle weakness (generalized): Secondary | ICD-10-CM

## 2024-05-31 DIAGNOSIS — R2681 Unsteadiness on feet: Secondary | ICD-10-CM | POA: Diagnosis not present

## 2024-05-31 DIAGNOSIS — R29818 Other symptoms and signs involving the nervous system: Secondary | ICD-10-CM | POA: Diagnosis not present

## 2024-05-31 DIAGNOSIS — R278 Other lack of coordination: Secondary | ICD-10-CM

## 2024-05-31 DIAGNOSIS — R29898 Other symptoms and signs involving the musculoskeletal system: Secondary | ICD-10-CM

## 2024-05-31 DIAGNOSIS — R471 Dysarthria and anarthria: Secondary | ICD-10-CM | POA: Diagnosis not present

## 2024-05-31 NOTE — Therapy (Unsigned)
 OUTPATIENT OCCUPATIONAL THERAPY PARKINSON'S TREATMENT AND PROGRESS NOTE  Patient Name: Timothy Shields MRN: [id number] DOB:07/23/1946, 78 y.o., male Today's Date: 05/31/2024  PCP: Janey Santos, MD  REFERRING PROVIDER: Morita, Hokuto, MD  END OF SESSION:  OT End of Session - 05/31/24 1401     Visit Number 10    Number of Visits 17   max number of visits possible   Date for Recertification  06/22/24    Authorization Type Medicare    OT Start Time 1406    OT Stop Time 1445    OT Time Calculation (min) 39 min    Activity Tolerance Patient tolerated treatment well    Behavior During Therapy WFL for tasks assessed/performed         Past Medical History:  Diagnosis Date   Cerebrovascular disease    Headache(784.0)    Stroke (HCC)    3   Past Surgical History:  Procedure Laterality Date   HERNIA REPAIR     HERNIA REPAIR     Twice in the 1970's   Patient Active Problem List   Diagnosis Date Noted   Erectile dysfunction 09/13/2013   Other generalized ischemic cerebrovascular disease 07/10/2013   Other and unspecified hyperlipidemia 07/10/2013   ONSET DATE: 03/01/2024 (Date of referral)  REFERRING DIAG: G20.A1 (ICD-10-CM) - Parkinson's disease without dyskinesia, without mention of fluctuations  THERAPY DIAG:  Muscle weakness (generalized)  Other symptoms and signs involving the nervous system  Other symptoms and signs involving the musculoskeletal system  Other lack of coordination  Rationale for Evaluation and Treatment: Rehabilitation  SUBJECTIVE:   SUBJECTIVE STATEMENT: Pt has chronic Rt hip and Rt shoulder pain, Lt shoulder pain for a few months  Pt accompanied by: self  PERTINENT HISTORY: Per Dr. Rosemarie: remote right PICA and left PCA infarcts in 1982 as well as left basal ganglia lacunar infarct in 2005 with mild residual right sided weakness with recent increase complaints of worsening gait difficulties likely multifactorial due to combination of old  strokes, poststroke parkinsonism and mild spinal stenosis and spondylosis. He also has mild cognitive impairment which appears quite stable     Patient has noticed increasing tremors in his left hand as well as some increase hypophonia and drooling.  He does have some difficulty at times getting up from the chair or wearing his clothes.  He was seen at [facility] neurology for consultation and started on Sinemet  25/100 mg 1-1/2 tablets 3 times daily. He continues to ambulate with a wheeled walker and feels his balance is poor. He has had a few falls in fact 9 in the last several months fortunately no major injury.   Had a driving evaluation from [facility] and was told not to drive. Has had 9 falls in the past year. Goes to a [employer] 3x a week.   PRECAUTIONS: Fall  WEIGHT BEARING RESTRICTIONS: No  PAIN:  Are you having pain? Pt reports chronic Rt shoulder pain but more recent Lt shoulder pain 0/10 at rest, up to 7/10 with certain activities (raising sheets)   FALLS: Has patient fallen in last 6 months? Yes. Number of falls 9  LIVING ENVIRONMENT: Lives with: lives with their spouse, daughter lives close by and son is moving closer  Lives in: House/apartment - will soon be downsizing to an apartment  Stairs: Yes: External: 4 huge stone steps steps; on right going up Has following equipment at home: Single point cane, Walker - 4 wheeled, and Grab bars  PLOF: Independent with household  mobility with device, Independent with community mobility with device, Vocation/Vocational requirements: Currently still working, Publishing copy, and Leisure: collects books and baseball cards (has been collecting for 56 years); not driving (as of a few weeks ago)   PATIENT GOALS: PD management  OBJECTIVE:  Note: Objective measures were completed at Evaluation unless otherwise noted.  HAND DOMINANCE: Right  ADLs: Overall ADLs: mostly mod I; requires assistance with buttons, especially with  sleeve cuffs  IADLs: Handwriting: 90% legible, Severe micrographia, and pt reports he has always written small, but it has gotten even smaller. It is better if he thinks about what he is going to write and takes his time. There are times that he writes something down and then is unable to determine what he wrote.   MOBILITY STATUS: See PT notes  POSTURE COMMENTS:  rounded shoulders, forward head, and weight shift left, head and trunk tilted to the L   ACTIVITY TOLERANCE: Activity tolerance: good to fair  FUNCTIONAL OUTCOME MEASURES: 05/03/2024:Fastening/unfastening 3 buttons: 61 sec Physical performance test: PPT#2 (simulated eating) 22 sec & PPT#4 (donning/doffing jacket): 21 sec  COORDINATION: 05/03/2024: 9 Hole Peg test: Right: 37 sec; Left: 33 sec Box and Blocks:  Right 47blocks, Left 47blocks  UE ROM:  WFL  MUSCLE TONE: TBD  COGNITION: Overall cognitive status: History of cognitive impairments - at baseline  OBSERVATIONS: resting LUE tremor; impulsive; mild bradykinesia                                                                                                                   TREATMENT:   ***  PATIENT EDUCATION: Education details: see above Person educated: Patient Education method: Explanation, Demonstration, Verbal cues, and Handouts Education comprehension: verbalized understanding, returned demonstration, and verbal cues required  HOME EXERCISE PROGRAM: 03/23/2024: handwriting samples 04/19/2024: PWR! Hands 04/26/2024: handwriting changes 05/01/2024: Coordination HEP 05/03/2024: buttoning 05/10/2024: Jacket handout 05/17/2024: Keeping Thinking Skills [place] handout, Memory strategies, preventing PD complications 05/21/24: PD ex chart  GOALS:  SHORT TERM GOALS: Target date: 05/17/2024    Pt will be independent with PD specific HEP.  Baseline: not yet initiated Goal status: IN PROGRESS  2.  Pt will verbalize understanding of adapted strategies to maximize  safety and independence with ADLs/IADLs.  Baseline: not yet initiated Goal status: IN PROGRESS  3.  Will set appropriate coordination based goal. Baseline: not yet initiated Goal status: MET  LONG TERM GOALS: Target date: 06/22/24    Pt will verbalize understanding of ways to prevent future PD related complications and PD community resources.  Baseline: not yet initiated Goal status: MET  2.  Pt will write a short paragraph with no significant decrease in size and maintain 100% legibility.  Baseline: 90% legibility severe micrographia Goal status: IN PROGRESS  3.  Pt will verbalize understanding of ways to keep thinking skills sharp and ways to compensate for STM changes in the future.  Baseline: not yet initiated Goal status: MET  4.  Pt will be able to place  at least 5 additional blocks using R hand with completion of Box and Blocks test.  Baseline: 04/19/2024: Right - 42 blocks; Left - 49 blocks 05/03/2024: R - 47 blocks Goal status: MET   5.  Pt will demonstrate improved ease with fastening buttons as evidenced by decreasing 3 button/unbutton time by at least 5 seconds or more as appropriate.  Baseline: 04/19/2024: 42 seconds 05/03/2024: 61 seconds  05/31/2024: 32 seconds Goal status: MET  6.  Pt will demonstrate improved fine motor coordination for ADLs as evidenced by decreasing 9 hole peg test score for each hand by at least 5 secs or more as appropriate.  Baseline: 04/19/2024: Right - 33 seconds; Left - 38 seconds 05/03/2024: 9 Hole Peg test: Right: 37 sec; Left: 33 sec 05/31/2024: Right: 25 sec; Left: 29 sec Goal status: MET   7.  Pt will demonstrate increased ease with dressing as evidenced by decreasing PPT#4 (don/ doff jacket) to 15 secs or less. Baseline: 21 sec 05/31/2024: Goal status: INITIAL  8.  Pt will demonstrate improved ease with feeding as evidenced by decreasing PPT#2 by at least 5 seconds.  Baseline: 22 seconds Goal status: MET (05/14/24: 13 sec)    ASSESSMENT:  CLINICAL IMPRESSION:  This ***th progress note is for dates: *** to 05/31/2024. Pt has met *** STGs and *** LTGs. Pt making progress towards goals as expected and continues to benefit from skilled OT services in the outpatient setting to work towards remaining goals or until max rehab potential is met.   Pt presents with performance deficits as indicated below due to parkinson's disease. He demonstrates good rehab potential as evidence to follow through with HEP and PWR! exercises. He will continue to benefit from skilled outpatient OT to improve management of PD and functional independence for ADLs and IADLs.   PERFORMANCE DEFICITS: in functional skills including ADLs, IADLs, coordination, ROM, Fine motor control, Gross motor control, mobility, balance, decreased knowledge of precautions, decreased knowledge of use of DME, and UE functional use.   IMPAIRMENTS: are limiting patient from ADLs, IADLs, rest and sleep, leisure, and social participation.   COMORBIDITIES:  may have co-morbidities  that affects occupational performance. Patient will benefit from skilled OT to address above impairments and improve overall function.  REHAB POTENTIAL: Good  PLAN:  OT FREQUENCY: 2x/week  OT DURATION: 8 weeks  PLANNED INTERVENTIONS: 97168 OT Re-evaluation, 97535 self care/ADL training, 02889 therapeutic exercise, 97530 therapeutic activity, 97112 neuromuscular re-education, functional mobility training, coping strategies training, patient/family education, and DME and/or AE instructions  RECOMMENDED OTHER SERVICES: N/A for this visit  CONSULTED AND AGREED WITH PLAN OF CARE: Patient  PLAN FOR NEXT SESSION: golf balls  Encourage pt to speak LOUDLY Progress towards goals; issue PWR! Supine  Jocelyn CHRISTELLA Bottom, OT 05/31/2024, 4:57 PM

## 2024-06-04 ENCOUNTER — Encounter: Payer: Self-pay | Admitting: Occupational Therapy

## 2024-06-04 ENCOUNTER — Ambulatory Visit: Admitting: Occupational Therapy

## 2024-06-04 ENCOUNTER — Ambulatory Visit: Admitting: Speech Pathology

## 2024-06-04 DIAGNOSIS — M6281 Muscle weakness (generalized): Secondary | ICD-10-CM | POA: Diagnosis not present

## 2024-06-04 DIAGNOSIS — R2681 Unsteadiness on feet: Secondary | ICD-10-CM | POA: Diagnosis not present

## 2024-06-04 DIAGNOSIS — R29818 Other symptoms and signs involving the nervous system: Secondary | ICD-10-CM | POA: Diagnosis not present

## 2024-06-04 DIAGNOSIS — R29898 Other symptoms and signs involving the musculoskeletal system: Secondary | ICD-10-CM

## 2024-06-04 DIAGNOSIS — R471 Dysarthria and anarthria: Secondary | ICD-10-CM

## 2024-06-04 DIAGNOSIS — R278 Other lack of coordination: Secondary | ICD-10-CM | POA: Diagnosis not present

## 2024-06-04 NOTE — Patient Instructions (Signed)
 Timothy Shields

## 2024-06-04 NOTE — Patient Instructions (Addendum)
   I'll have the sliced rare tuna as  my main course please  Slater will  have the almond crusted trout please  I like the yellow chicken curry at [place]  If you have [place] on draft I'll have that. If not I'll do a glass of wine  What kind of Rose do you have?

## 2024-06-04 NOTE — Therapy (Signed)
 OUTPATIENT OCCUPATIONAL THERAPY PARKINSON'S TREATMENT AND PROGRESS NOTE  Patient Name: Timothy Shields MRN: [id number] DOB:Nov 25, 1945, 78 y.o., male Today's Date: 06/04/2024  PCP: Janey Santos, MD  REFERRING PROVIDER: Morita, Hokuto, MD  END OF SESSION:  OT End of Session - 06/04/24 1323     Visit Number 11    Number of Visits 17   max number of visits possible   Date for Recertification  06/22/24    Authorization Type Medicare    Progress Note Due on Visit 10    OT Start Time 1320    OT Stop Time 1400    OT Time Calculation (min) 40 min    Activity Tolerance Patient tolerated treatment well    Behavior During Therapy WFL for tasks assessed/performed         Past Medical History:  Diagnosis Date   Cerebrovascular disease    Headache(784.0)    Stroke (HCC)    3   Past Surgical History:  Procedure Laterality Date   HERNIA REPAIR     HERNIA REPAIR     Twice in the 1970's   Patient Active Problem List   Diagnosis Date Noted   Erectile dysfunction 09/13/2013   Other generalized ischemic cerebrovascular disease 07/10/2013   Other and unspecified hyperlipidemia 07/10/2013   ONSET DATE: 03/01/2024 (Date of referral)  REFERRING DIAG: G20.A1 (ICD-10-CM) - Parkinson's disease without dyskinesia, without mention of fluctuations  THERAPY DIAG:  Other symptoms and signs involving the nervous system  Other lack of coordination  Unsteadiness on feet  Other symptoms and signs involving the musculoskeletal system  Rationale for Evaluation and Treatment: Rehabilitation  SUBJECTIVE:   SUBJECTIVE STATEMENT: Raising sheets to get out of bed bothers Lt shoulder. Pt denies falls  Pt accompanied by: self  PERTINENT HISTORY: Per Dr. Rosemarie: remote right PICA and left PCA infarcts in 1982 as well as left basal ganglia lacunar infarct in 2005 with mild residual right sided weakness with recent increase complaints of worsening gait difficulties likely multifactorial due to  combination of old strokes, poststroke parkinsonism and mild spinal stenosis and spondylosis. He also has mild cognitive impairment which appears quite stable     Patient has noticed increasing tremors in his left hand as well as some increase hypophonia and drooling.  He does have some difficulty at times getting up from the chair or wearing his clothes.  He was seen at [facility] neurology for consultation and started on Sinemet  25/100 mg 1-1/2 tablets 3 times daily. He continues to ambulate with a wheeled walker and feels his balance is poor. He has had a few falls in fact 9 in the last several months fortunately no major injury.   Had a driving evaluation from [facility] and was told not to drive. Has had 9 falls in the past year. Goes to a [employer] 3x a week.   PRECAUTIONS: Fall  WEIGHT BEARING RESTRICTIONS: No  PAIN:  Are you having pain? Pt reports chronic Rt shoulder pain but more recent Lt shoulder pain 0/10 at rest, up to 7/10 with certain activities (raising sheets)   FALLS: Has patient fallen in last 6 months? Yes. Number of falls 9  LIVING ENVIRONMENT: Lives with: lives with their spouse, daughter lives close by and son is moving closer  Lives in: House/apartment - will soon be downsizing to an apartment  Stairs: Yes: External: 4 huge stone steps steps; on right going up Has following equipment at home: Single point cane, Walker - 4 wheeled, and [employer]  bars  PLOF: Independent with household mobility with device, Independent with community mobility with device, Vocation/Vocational requirements: Currently still working, Theatre manager a Physiological scientist, and Leisure: collects books and baseball cards (has been collecting for 56 years); not driving (as of a few weeks ago)   PATIENT GOALS: PD management  OBJECTIVE:  Note: Objective measures were completed at Evaluation unless otherwise noted.  HAND DOMINANCE: Right  ADLs: Overall ADLs: mostly mod I; requires assistance with  buttons, especially with sleeve cuffs  IADLs: Handwriting: 90% legible, Severe micrographia, and pt reports he has always written small, but it has gotten even smaller. It is better if he thinks about what he is going to write and takes his time. There are times that he writes something down and then is unable to determine what he wrote.   MOBILITY STATUS: See PT notes  POSTURE COMMENTS:  rounded shoulders, forward head, and weight shift left, head and trunk tilted to the L   ACTIVITY TOLERANCE: Activity tolerance: good to fair  FUNCTIONAL OUTCOME MEASURES: 05/03/2024:Fastening/unfastening 3 buttons: 61 sec Physical performance test: PPT#2 (simulated eating) 22 sec & PPT#4 (donning/doffing jacket): 21 sec  COORDINATION: 05/03/2024: 9 Hole Peg test: Right: 37 sec; Left: 33 sec Box and Blocks:  Right 47blocks, Left 47blocks  UE ROM:  WFL  MUSCLE TONE: TBD  COGNITION: Overall cognitive status: History of cognitive impairments - at baseline  OBSERVATIONS: resting LUE tremor; impulsive; mild bradykinesia                                                                                                                  TREATMENT:   Pt issued PWR! Supine (basic 4) and performed each x 20 reps for bed mobility, stretching, and flexibility and to hopefully decreased shoulder pain. Pt cued for large amplitude movements and for correct movement patterns UBE x 5 mintues, level 1 for UE strength/endurance maintaining RPM > 40 to increase HR and functional movement   PATIENT EDUCATION: Education details: see above Person educated: Patient Education method: Explanation, Demonstration, and Verbal cues Education comprehension: verbalized understanding, returned demonstration, and verbal cues required  HOME EXERCISE PROGRAM: 03/23/2024: handwriting samples 04/19/2024: PWR! Hands 04/26/2024: handwriting changes 05/01/2024: Coordination HEP 05/03/2024: buttoning 05/10/2024: Jacket handout 05/17/2024:  Keeping Thinking Skills [place] handout, Memory strategies, preventing PD complications 05/21/24: PD ex chart 06/04/24: ALYSSA! supine  GOALS:  SHORT TERM GOALS: Target date: 05/17/2024    Pt will be independent with PD specific HEP.  Baseline: not yet initiated Goal status: IN PROGRESS  2.  Pt will verbalize understanding of adapted strategies to maximize safety and independence with ADLs/IADLs.  Baseline: not yet initiated Goal status: IN PROGRESS  3.  Will set appropriate coordination based goal. Baseline: not yet initiated Goal status: MET  LONG TERM GOALS: Target date: 06/22/24    Pt will verbalize understanding of ways to prevent future PD related complications and PD community resources.  Baseline: not yet initiated Goal status: MET  2.  Pt will write a short paragraph  with no significant decrease in size and maintain 100% legibility.  Baseline: 90% legibility severe micrographia 05/31/2024: 95% legibility with moderate micrographia Goal status: IN PROGRESS  3.  Pt will verbalize understanding of ways to keep thinking skills sharp and ways to compensate for STM changes in the future.  Baseline: not yet initiated Goal status: MET  4.  Pt will be able to place at least 5 additional blocks using R hand with completion of Box and Blocks test.  Baseline: 04/19/2024: Right - 42 blocks; Left - 49 blocks 05/03/2024: R - 47 blocks Goal status: MET   5.  Pt will demonstrate improved ease with fastening buttons as evidenced by decreasing 3 button/unbutton time by at least 5 seconds or more as appropriate.  Baseline: 04/19/2024: 42 seconds 05/03/2024: 61 seconds  05/31/2024: 32 seconds Goal status: MET  6.  Pt will demonstrate improved fine motor coordination for ADLs as evidenced by decreasing 9 hole peg test score for each hand by at least 5 secs or more as appropriate.  Baseline: 04/19/2024: Right - 33 seconds; Left - 38 seconds 05/03/2024: 9 Hole Peg test: Right: 37 sec; Left:  33 sec 05/31/2024: Right: 25 sec; Left: 29 sec Goal status: MET   7.  Pt will demonstrate increased ease with dressing as evidenced by decreasing PPT#4 (don/ doff jacket) to 15 secs or less. Baseline: 21 sec 05/31/2024: Goal status: INITIAL  8.  Pt will demonstrate improved ease with feeding as evidenced by decreasing PPT#2 by at least 5 seconds.  Baseline: 22 seconds Goal status: MET (05/14/24: 13 sec)   ASSESSMENT:  CLINICAL IMPRESSION:  Pt has met 1/3 STGs and 6/8 LTGs. Pt making progress towards goals as expected and continues to benefit from skilled OT services in the outpatient setting to work towards remaining goals or until max rehab potential is met.   Pt presents with performance deficits as indicated below due to parkinson's disease. He demonstrates good rehab potential as evidence to follow through with HEP and PWR! exercises. He will continue to benefit from skilled outpatient OT to improve management of PD and functional independence for ADLs and IADLs.   PERFORMANCE DEFICITS: in functional skills including ADLs, IADLs, coordination, ROM, Fine motor control, Gross motor control, mobility, balance, decreased knowledge of precautions, decreased knowledge of use of DME, and UE functional use.   IMPAIRMENTS: are limiting patient from ADLs, IADLs, rest and sleep, leisure, and social participation.   COMORBIDITIES:  may have co-morbidities  that affects occupational performance. Patient will benefit from skilled OT to address above impairments and improve overall function.  REHAB POTENTIAL: Good  PLAN:  OT FREQUENCY: 2x/week  OT DURATION: 8 weeks  PLANNED INTERVENTIONS: 97168 OT Re-evaluation, 97535 self care/ADL training, 02889 therapeutic exercise, 97530 therapeutic activity, 97112 neuromuscular re-education, functional mobility training, coping strategies training, patient/family education, and DME and/or AE instructions  RECOMMENDED OTHER SERVICES: N/A for this  visit  CONSULTED AND AGREED WITH PLAN OF CARE: Patient  PLAN FOR NEXT SESSION: review golf ball exercise, progress towards remaining goals, review PWR! Supine prn   Burnard JINNY Roads, OT 06/04/2024, 1:24 PM

## 2024-06-04 NOTE — Therapy (Signed)
 OUTPATIENT SPEECH LANGUAGE PATHOLOGY TREATMENT NOTE   Patient Name: Timothy Shields MRN: [id number] DOB:03/22/1946, 78 y.o., male Today's Date: 06/04/2024  PCP: Janey Santos, MD REFERRING PROVIDER: Morita, Hokuto, MD  END OF SESSION:  End of Session - 06/04/24 1405     Visit Number 6    Number of Visits 25    Date for Recertification  06/21/24    SLP Start Time 1400    SLP Stop Time  1445    SLP Time Calculation (min) 45 min    Activity Tolerance Patient tolerated treatment well           Past Medical History:  Diagnosis Date   Cerebrovascular disease    Headache(784.0)    Stroke (HCC)    3   Past Surgical History:  Procedure Laterality Date   HERNIA REPAIR     HERNIA REPAIR     Twice in the 1970's   Patient Active Problem List   Diagnosis Date Noted   Erectile dysfunction 09/13/2013   Other generalized ischemic cerebrovascular disease 07/10/2013   Other and unspecified hyperlipidemia 07/10/2013    ONSET DATE: 03/01/2024 (referral) PD sx 2-3 years ago  REFERRING DIAG:  Diagnosis  G20.A1 (ICD-10-CM) - Parkinson's disease without dyskinesia, without mention of fluctuations    THERAPY DIAG:  No diagnosis found.  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 Prescribed him carbidopa  levodopa  3 and 1/2 tabs for 3 times a day. Symptoms started 2-3 years ago. Started to get the tremors last year. Reports that after starting Carbidopa  he has been able to turn easier in the bed and has been sleeping easier.  Per Dr. Rosemarie: remote right PICA and left PCA infarcts in 1982 as well as left basal ganglia lacunar infarct in 2005 with mild residual right sided weakness with recent increase  complaints of worsening gait difficulties likely multifactorial due to combination of old strokes, poststroke parkinsonism and mild spinal stenosis and spondylosis. He also has mild cognitive impairment which appears quite stable   PAIN:  Are you having pain? No  FALLS: Has patient fallen in last 6 months?  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 Pt Rating  Having a conversation with a family member or friends at home 2  Participating in conversation with strangers in a quiet place  4  Conversing with a familiar person over the telephone 4  Conversing with a stranger over the telephone 3  Being part of a conversation in a noisy environment  1  Speaking to a friend when you are emotionally upset or angry 4  Having a conversation while traveling in the car 3  Having a conversation with someone at a distance 1  1= not at all effective 4= very effective  TREATMENT DATE:   06/04/24: Timothy Shields continues to report inconsistent practicing. I provided [employer] phone number for him to call to see if he can get a new booklet sent as he is unable to access online resources. SPEAK OUT! Restaurant lesson completed from E [employer] - Jahvier hit all dB targets with occasional min A - he maintained 75dB 20/20 sentences with rare min A. Continue to generate personally relevant sentences and orders to practice along with SPEAK OUT! Exercises - today we generated 5. In conversation,  Timothy Shields requires usual mod verbal and gesture cues to maintain 70dB over 5 minutes.   05/28/24: Timothy Shields has not found his workbook yet. He practices with reading and SPEAK OUT! Exercises from memory. Today, we complete SPEAK OUT exercises (syllables to counting) with rare min visual cues. Sustained ah averaged 92dB, glide 85dB, counting 84dB. Reading aloud 4 words and explaining which does go and why with occasional min A to use intent to average 77dB. In simple conversation he required occasional gesture cues and verbal cues to maintain intentional speech and average 74dB   05/21/24: Timothy Shields has not found his workbook nor accessed the E Library or online home practice session , there fore HEP not completed. Today, we completed the SPEAK OUT exercises - loud Ah averaged 86dB with rare min A, glides averaged   83dB; Counting 84dB with rare min modeling and gesture cues. Reading using E [employer], Timothy Shields used intent to average 84dB with rare min A - in conversation task and unstructured conversation he requires usual min to mod verbal and gesture cues to speak with intent and average 72dB. He has not watched the What is Parkinson's video - I requested spouse attend sessions, he states she is unable to attend due to her schedule. Role play ordering at restaurant his beer and order at [facility] with rare min A to order with intent.   05/14/24: Targeted  improved volume and intelligibility using Speak Out! Lesson 2. Pt required occasional min verbal cues, modeling, for volume and breath support.Pt averages the following volume levels:  Sustained AH: 90 dB  Counting:88dB  Reading (phrases):   Cognitive Exercise: 84dB Required usual min to mod verbal cues, modeling for carryover of intent /volume generating 3 phrase descriptions following structured practice.  Reviewed requirement to watch the What is Parkinson's video, reviewed [employer] ([employer]) website to locate  the video and home practice sessions - he is to complete at least 1 online home practice session prior to next visit.   05/03/24: Target improving vocal quality and increasing intensity through progressively difficulty speech tasks using Speak Out! program, lesson 1. ST leads pt through exercises providing usual model prior to pt execution. occasional min-A required to achieve target dB this date and avoid volume decay. Averages this date:  Resonant vowels -- 87 dB Sustained ah --  90 dB, holds for average 8 seconds  Counting -- 89 dB Reading phrases --  87 dB Cognitive speech task -- 77 dB.  Conversational sample of approx 5 minutes, pt averages 74 dB with rare min-A. Discussed importance of daily home practice, ideally BID. Pt reports intent to practice using online practice.   03/29/24: Evaluation completed. Introduced Hao to [employer] ([employer]) website. Demonstrated navigation to What is Parkinson video and on line practice sessions. Initiated training in Speak Out! Lesson 1 - after initial modeling and verbal instructions, Deo achieved average of 85dB on loud Ah 5/5x, 81dB average  on counting and 78dB average on reading 20/20 sentences. He required frequent mod A to carryover speaking with intent in conversation today. Requested his spouse attend ST sessions. Requested access to [employer] on [employer] website   PATIENT EDUCATION: Education details: See Treatment, See Patient Instructions, HEP, compensations for dysarthria Person educated: Patient Education method: Explanation, Demonstration, Verbal cues, and Handouts Education comprehension: returned demonstration, verbal cues required, and needs further education  HOME EXERCISE PROGRAM: SPEAK OUT!   GOALS: Goals reviewed with patient? Yes  SHORT TERM GOALS: Target date: 05/03/24   Pt will complete HEP daily with occasional min A Baseline: Goal status: NOT MET  2.  Pt will average 72dB and clear phonation 18/20  sentences Baseline:  Goal status: MET  3.  Pt will complete 2 online practice sessions Baseline:  Goal status: NOT MET  4.  Pt will access E Library and complete 1 lesson from E Library Baseline:  Goal status: NOT MET  5.  Pt will average 70dB over 8 minute conversation with occasional min A Baseline:  Goal status: NOT MET    LONG TERM GOALS: Target date: 06/21/24  Pt will complete HEP twice daily 5/7 days with rare min A Baseline:  Goal status: ONGOING  2.  Pt will average 70dB over 15 minute conversation with rare min A Baseline:  Goal status: ONGOING  3.  Pt will verbalize plan to continue Speak Out! Daily upon d/c from ST Baseline:  Goal status: ONGOING  4.  Pt will improve score on PROM Baseline:  Goal status: ONGOING  ASSESSMENT:  CLINICAL IMPRESSION: Patient is a 78 y.o. 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 He denies any s/s of dysphagia. He reports his wife often needs him to repeat himself and tells him he is speaking too low. [place] is also aware that others have difficulty hearing him and he is having to repeat often.He has difficulty communicating over the phone. Voice is hoarse, intermittently aphonic. I recommend skilled ST to maximize intelligibility for safety, to reduce caregiver burden and improve social participation.  OBJECTIVE IMPAIRMENTS: Objective impairments include attention, memory, and dysarthria. These impairments are limiting patient from effectively communicating at home and in community.Factors affecting potential to achieve goals and functional outcome are medical prognosis.. Patient will benefit from skilled SLP services to address above impairments and improve overall function.  REHAB POTENTIAL: Good  PLAN:  SLP FREQUENCY: 1-2x/week  SLP DURATION: 12 weeks  PLANNED INTERVENTIONS: Aspiration precaution training, Diet toleration management , Language  facilitation, Environmental controls, Cueing hierachy, Cognitive reorganization, Internal/external aids, Functional tasks, Multimodal communication approach, SLP instruction and feedback, Compensatory strategies, Patient/family education, [phone] Treatment of speech (30 or 45 min) , and 07473 Treatment of swallowing function    Louie Flenner, Leita Caldron, CCC-SLP 06/04/2024, 2:47 PM

## 2024-06-07 ENCOUNTER — Ambulatory Visit: Attending: Neurology | Admitting: Occupational Therapy

## 2024-06-07 ENCOUNTER — Encounter: Admitting: Speech Pathology

## 2024-06-07 DIAGNOSIS — M6281 Muscle weakness (generalized): Secondary | ICD-10-CM | POA: Insufficient documentation

## 2024-06-07 DIAGNOSIS — R29898 Other symptoms and signs involving the musculoskeletal system: Secondary | ICD-10-CM | POA: Insufficient documentation

## 2024-06-07 DIAGNOSIS — R41840 Attention and concentration deficit: Secondary | ICD-10-CM | POA: Insufficient documentation

## 2024-06-07 DIAGNOSIS — R41844 Frontal lobe and executive function deficit: Secondary | ICD-10-CM | POA: Insufficient documentation

## 2024-06-07 DIAGNOSIS — R29818 Other symptoms and signs involving the nervous system: Secondary | ICD-10-CM | POA: Insufficient documentation

## 2024-06-07 DIAGNOSIS — R278 Other lack of coordination: Secondary | ICD-10-CM | POA: Insufficient documentation

## 2024-06-07 DIAGNOSIS — R471 Dysarthria and anarthria: Secondary | ICD-10-CM | POA: Insufficient documentation

## 2024-06-11 ENCOUNTER — Encounter: Payer: Self-pay | Admitting: Speech Pathology

## 2024-06-11 ENCOUNTER — Ambulatory Visit: Admitting: Speech Pathology

## 2024-06-11 DIAGNOSIS — R41840 Attention and concentration deficit: Secondary | ICD-10-CM | POA: Diagnosis not present

## 2024-06-11 DIAGNOSIS — R471 Dysarthria and anarthria: Secondary | ICD-10-CM | POA: Diagnosis not present

## 2024-06-11 DIAGNOSIS — R29898 Other symptoms and signs involving the musculoskeletal system: Secondary | ICD-10-CM | POA: Diagnosis not present

## 2024-06-11 DIAGNOSIS — R278 Other lack of coordination: Secondary | ICD-10-CM | POA: Diagnosis not present

## 2024-06-11 DIAGNOSIS — R41844 Frontal lobe and executive function deficit: Secondary | ICD-10-CM | POA: Diagnosis not present

## 2024-06-11 DIAGNOSIS — R29818 Other symptoms and signs involving the nervous system: Secondary | ICD-10-CM | POA: Diagnosis not present

## 2024-06-11 DIAGNOSIS — M6281 Muscle weakness (generalized): Secondary | ICD-10-CM | POA: Diagnosis not present

## 2024-06-11 NOTE — Therapy (Signed)
 OUTPATIENT SPEECH LANGUAGE PATHOLOGY TREATMENT NOTE   Patient Name: Timothy Shields MRN: [id number] DOB:10-02-1945, 78 y.o., male Today's Date: 06/11/2024  PCP: Janey Santos, MD REFERRING PROVIDER: Morita, Hokuto, MD  END OF SESSION:  End of Session - 06/11/24 1341     Visit Number 8    Number of Visits 25    Date for Recertification  06/21/24    SLP Start Time 1315    SLP Stop Time  1400    SLP Time Calculation (min) 45 min    Activity Tolerance Patient tolerated treatment well           Past Medical History:  Diagnosis Date   Cerebrovascular disease    Headache(784.0)    Stroke (HCC)    3   Past Surgical History:  Procedure Laterality Date   HERNIA REPAIR     HERNIA REPAIR     Twice in the 1970's   Patient Active Problem List   Diagnosis Date Noted   Erectile dysfunction 09/13/2013   Other generalized ischemic cerebrovascular disease 07/10/2013   Other and unspecified hyperlipidemia 07/10/2013    ONSET DATE: 03/01/2024 (referral) PD sx 2-3 years ago  REFERRING DIAG:  Diagnosis  G20.A1 (ICD-10-CM) - Parkinson's disease without dyskinesia, without mention of fluctuations    THERAPY DIAG:  Dysarthria and anarthria  Rationale for Evaluation and Treatment: Rehabilitation  SUBJECTIVE:   SUBJECTIVE STATEMENT: I still can't find the book    Pt accompanied by: self  PERTINENT HISTORY: Saw a PD specialist at [facility] and reports 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 Prescribed him carbidopa  levodopa  3 and 1/2 tabs for 3 times a day. Symptoms started 2-3 years ago. Started to get the tremors last year. Reports that after starting Carbidopa  he has been able to turn easier in the bed and has been sleeping easier.  Per Dr. Rosemarie: remote right PICA and left PCA infarcts in 1982 as well as left basal ganglia lacunar infarct in 2005 with mild residual right sided weakness with  recent increase complaints of worsening gait difficulties likely multifactorial due to combination of old strokes, poststroke parkinsonism and mild spinal stenosis and spondylosis. He also has mild cognitive impairment which appears quite stable   PAIN:  Are you having pain? No  FALLS: Has patient fallen in last 6 months?  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 Pt Rating  Having a conversation with a family member or friends at home 2  Participating in conversation with strangers in a quiet place  4  Conversing with a familiar person over the telephone 4  Conversing with a stranger over the telephone 3  Being part of a conversation in a noisy environment  1  Speaking to a friend when you are emotionally upset or angry 4  Having a conversation while traveling in the car 3  Having a conversation with someone at a distance 1  1= not at all effective 4= very effective  TREATMENT DATE:   06/11/24: [name] reports inconsistent practicing - 1x a week Targeted volume and intelligibility using Speak Out! Lesson E Library 2nd week in October in A Year of Intent. Attempted Pt required rare min verbal cues, modeling, for volume and breath support.   Pt averages the following volume levels:  Sustained AH: 88dB  Counting:85dB  Reading (phrases): 83dB  Cognitive Exercise: 68dB Required frequent mod verbal cues, modeling for carryover of intent in simple conversation. Conversation  averaged 68dB. Completing mildly complex analogies with average of 80dB  06/04/24: Filimon continues to report inconsistent practicing. I provided [employer] phone number for him to call to see if he can get a new booklet sent as he is unable to access online resources. SPEAK OUT! Restaurant lesson completed from E Library - Humberto hit all dB targets with occasional min A - he maintained 75dB 20/20 sentences with rare min A. Continue to generate personally relevant sentences and orders to practice along with SPEAK OUT! Exercises - today we generated 5. In conversation, Bond requires usual mod verbal and gesture cues to maintain 70dB over 5 minutes.   05/28/24: Temitayo has not found his workbook yet. He practices with reading and SPEAK OUT! Exercises from memory. Today, we complete SPEAK OUT exercises (syllables to counting) with rare min visual cues. Sustained ah averaged 92dB, glide 85dB, counting 84dB. Reading aloud 4 words and explaining which does go and why with occasional min A to use intent to average 77dB. In simple conversation he required occasional gesture cues and verbal cues to maintain intentional speech and average 74dB   05/21/24: Sayf has not found his workbook nor accessed the E Library or online home practice session , there fore HEP not completed. Today, we completed the SPEAK OUT exercises - loud Ah averaged 86dB with rare min A, glides averaged   83dB; Counting 84dB with rare min modeling and gesture cues. Reading using E [employer], Uriyah used intent to average 84dB with rare min A - in conversation task and unstructured conversation he requires usual min to mod verbal and gesture cues to speak with intent and average 72dB. He has not watched the What is Parkinson's video - I requested spouse attend sessions, he states she is unable to attend due to her schedule. Role play ordering at restaurant his beer and order at [facility] with rare  min A to order with intent.   05/14/24: Targeted  improved volume and intelligibility using Speak Out! Lesson 2. Pt required occasional min verbal cues, modeling, for volume and breath support.Pt averages the following volume levels:  Sustained AH: 90 dB  Counting:88dB  Reading (phrases):   Cognitive Exercise: 84dB Required usual min to mod verbal cues, modeling for carryover of intent /volume generating 3 phrase descriptions following structured practice.  Reviewed requirement to watch the What is Parkinson's video, reviewed [employer] ([employer]) website to locate the video and home practice sessions - he is to complete at least 1 online home practice session prior to next visit.   05/03/24: Target improving vocal quality and increasing intensity through progressively difficulty speech tasks using Speak Out! program, lesson 1. ST leads pt through exercises providing usual model prior to pt execution. occasional min-A required to achieve target dB this date and avoid volume decay. Averages this date:  Resonant vowels -- 87 dB Sustained ah --  90 dB, holds for average 8 seconds  Counting -- 89 dB Reading phrases --  87 dB Cognitive speech task -- 77 dB.  Conversational sample of approx 5 minutes, pt averages 74 dB with rare min-A. Discussed importance of daily home practice, ideally BID. Pt reports intent to practice using online practice.   03/29/24: Evaluation completed. Introduced Aslan to [employer] ([employer]P) website. Demonstrated navigation to What is Parkinson video and on line practice sessions. Initiated training in Speak Out! Lesson 1 - after initial modeling and verbal instructions, Bronislaus achieved average of 85dB on loud Ah 5/5x, 81dB average on counting and 78dB average on reading 20/20 sentences. He required frequent mod A to carryover speaking with intent in conversation today. Requested his spouse attend ST sessions. Requested access to [employer] on [employer]  website   PATIENT EDUCATION: Education details: See Treatment, See Patient Instructions, HEP, compensations for dysarthria Person educated: Patient Education method: Explanation, Demonstration, Verbal cues, and Handouts Education comprehension: returned demonstration, verbal cues required, and needs further education  HOME EXERCISE PROGRAM: SPEAK OUT!   GOALS: Goals reviewed with patient? Yes  SHORT TERM GOALS: Target date: 05/03/24   Pt will complete HEP daily with occasional min A Baseline: Goal status: NOT MET  2.  Pt will average 72dB and clear phonation 18/20 sentences Baseline:  Goal status: MET  3.  Pt will complete 2 online practice sessions Baseline:  Goal status: NOT MET  4.  Pt will access E Library and complete 1 lesson from E Library Baseline:  Goal status: NOT MET  5.  Pt will average 70dB over 8 minute conversation with occasional min A Baseline:  Goal status: NOT MET    LONG TERM GOALS: Target date: 06/21/24  Pt will complete HEP twice daily 5/7 days with rare min A Baseline:  Goal status: ONGOING  2.  Pt will average 70dB over 15 minute conversation with rare min A Baseline:  Goal status: ONGOING  3.  Pt will verbalize plan to continue Speak Out! Daily upon d/c from ST Baseline:  Goal status: ONGOING  4.  Pt will improve score on PROM Baseline:  Goal status: ONGOING  ASSESSMENT:  CLINICAL IMPRESSION: Patient is a 78 y.o. 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 He denies any s/s of dysphagia. He reports his wife often needs him to repeat himself and tells him he is speaking too low. Kyley is also aware that others have difficulty hearing him and he is having to repeat often.He has difficulty communicating over the phone. Voice is hoarse, intermittently aphonic. I recommend skilled ST to maximize intelligibility for safety, to reduce caregiver burden and improve  social participation.  OBJECTIVE IMPAIRMENTS: Objective impairments include attention, memory, and dysarthria. These impairments are limiting patient from effectively communicating at home and in community.Factors affecting potential to achieve goals and functional outcome are medical prognosis.. Patient will benefit from skilled SLP services to address above impairments and improve overall function.  REHAB POTENTIAL: Good  PLAN:  SLP FREQUENCY: 1-2x/week  SLP DURATION: 12 weeks  PLANNED INTERVENTIONS: Aspiration precaution training, Diet toleration management , Language facilitation, Environmental controls, Cueing hierachy, Cognitive reorganization, Internal/external aids, Functional tasks, Multimodal communication approach, SLP instruction and feedback, Compensatory strategies, Patient/family education, [phone] Treatment of speech (30 or 45 min) , and [id number] Treatment of swallowing function    Nikiya Starn, Leita Caldron, CCC-SLP 06/11/2024, 2:01 PM

## 2024-06-13 ENCOUNTER — Ambulatory Visit: Admitting: Speech Pathology

## 2024-06-13 ENCOUNTER — Encounter: Payer: Self-pay | Admitting: Speech Pathology

## 2024-06-13 DIAGNOSIS — M6281 Muscle weakness (generalized): Secondary | ICD-10-CM | POA: Diagnosis not present

## 2024-06-13 DIAGNOSIS — R41844 Frontal lobe and executive function deficit: Secondary | ICD-10-CM | POA: Diagnosis not present

## 2024-06-13 DIAGNOSIS — R471 Dysarthria and anarthria: Secondary | ICD-10-CM

## 2024-06-13 DIAGNOSIS — R29818 Other symptoms and signs involving the nervous system: Secondary | ICD-10-CM | POA: Diagnosis not present

## 2024-06-13 DIAGNOSIS — R41840 Attention and concentration deficit: Secondary | ICD-10-CM | POA: Diagnosis not present

## 2024-06-13 DIAGNOSIS — R278 Other lack of coordination: Secondary | ICD-10-CM | POA: Diagnosis not present

## 2024-06-13 NOTE — Therapy (Signed)
 OUTPATIENT SPEECH LANGUAGE PATHOLOGY TREATMENT NOTE   Patient Name: Timothy Shields MRN: [id number] DOB:1946-07-11, 78 y.o., male Today's Date: 06/13/2024  PCP: Janey Santos, MD REFERRING PROVIDER: Morita, Hokuto, MD  END OF SESSION:  End of Session - 06/13/24 1438     Visit Number 9    Number of Visits 25    Date for Recertification  06/21/24    SLP Start Time 1445    SLP Stop Time  1515    SLP Time Calculation (min) 30 min    Activity Tolerance Patient tolerated treatment well           Past Medical History:  Diagnosis Date   Cerebrovascular disease    Headache(784.0)    Stroke (HCC)    3   Past Surgical History:  Procedure Laterality Date   HERNIA REPAIR     HERNIA REPAIR     Twice in the 1970's   Patient Active Problem List   Diagnosis Date Noted   Erectile dysfunction 09/13/2013   Other generalized ischemic cerebrovascular disease 07/10/2013   Other and unspecified hyperlipidemia 07/10/2013    ONSET DATE: 03/01/2024 (referral) PD sx 2-3 years ago  REFERRING DIAG:  Diagnosis  G20.A1 (ICD-10-CM) - Parkinson's disease without dyskinesia, without mention of fluctuations    THERAPY DIAG:  Dysarthria and anarthria  Rationale for Evaluation and Treatment: Rehabilitation  SUBJECTIVE:   SUBJECTIVE STATEMENT: I need to leave early today   Pt accompanied by: self  PERTINENT HISTORY: Saw a PD specialist at [facility] and reports 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 Prescribed him carbidopa  levodopa  3 and 1/2 tabs for 3 times a day. Symptoms started 2-3 years ago. Started to get the tremors last year. Reports that after starting Carbidopa  he has been able to turn easier in the bed and has been sleeping easier.  Per Dr. Rosemarie: remote right PICA and left PCA infarcts in 1982 as well as left basal ganglia lacunar infarct in 2005 with mild residual right sided weakness with  recent increase complaints of worsening gait difficulties likely multifactorial due to combination of old strokes, poststroke parkinsonism and mild spinal stenosis and spondylosis. He also has mild cognitive impairment which appears quite stable   PAIN:  Are you having pain? No  FALLS: Has patient fallen in last 6 months?  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 Pt Rating  Having a conversation with a family member or friends at home 2  Participating in conversation with strangers in a quiet place  4  Conversing with a familiar person over the telephone 4  Conversing with a stranger over the telephone 3  Being part of a conversation in a noisy environment  1  Speaking to a friend when you are emotionally upset or angry 4  Having a conversation while traveling in the car 3  Having a conversation with someone at a distance 1  1= not at all effective 4= very effective  TREATMENT DATE:   06/13/24: [name] reports practicing twice since last session, HEP. SPEAK OUT! Lesson 16 completed today to target intelligibility. Rare min verbal cues and modeling to complete exercises accurately with intent to hit dB targets for all exercises. Reading aloud at paragraph level averaged 75dB with occasional min A. Conversation exercises averaged 74dB. In simple conversation he averaged 70dB with usual verbal and gesture cues to speak with intent  06/11/24: [name] reports inconsistent  practicing - 1x a week Targeted volume and intelligibility using Speak Out! Lesson E Library 2nd week in October in A Year of Intent. Attempted Pt required rare min verbal cues, modeling, for volume and breath support. Pt averages the following volume levels:  Sustained AH: 88dB  Counting:85dB  Reading (phrases): 83dB  Cognitive Exercise: 68dB Required frequent mod verbal cues, modeling for carryover of intent in simple conversation. Conversation averaged 68dB. Completing mildly complex analogies with average of 80dB  06/04/24: Bradely continues to report inconsistent practicing. I provided [employer] phone number for him to call to see if he can get a new booklet sent as he is unable to access online resources. SPEAK OUT! Restaurant lesson completed from E Library - Zamier hit all dB targets with occasional min A - he maintained 75dB 20/20 sentences with rare min A. Continue to generate personally relevant sentences and orders to practice along with SPEAK OUT! Exercises - today we generated 5. In conversation, Vivian requires usual mod verbal and gesture cues to maintain 70dB over 5 minutes.   05/28/24: Isreal has not found his workbook yet. He practices with reading and SPEAK OUT! Exercises from memory. Today, we complete SPEAK OUT exercises (syllables to counting) with rare min visual cues. Sustained ah averaged 92dB, glide 85dB, counting 84dB. Reading aloud 4 words and explaining which does go and why with occasional min A to use intent to average 77dB. In simple conversation he required occasional gesture cues and verbal cues to maintain intentional speech and average 74dB   05/21/24: Daichi has not found his workbook nor accessed the E Library or online home practice session , there fore HEP not completed. Today, we completed the SPEAK OUT exercises - loud Ah averaged 86dB with rare min A, glides averaged   83dB; Counting 84dB with rare min modeling and gesture cues. Reading using E  [employer], Cordie used intent to average 84dB with rare min A - in conversation task and unstructured conversation he requires usual min to mod verbal and gesture cues to speak with intent and average 72dB. He has not watched the What is Parkinson's video - I requested spouse attend sessions, he states she is unable to attend due to her schedule. Role play ordering at restaurant his beer and order at [facility] with rare min A to order with intent.   05/14/24: Targeted  improved volume and intelligibility using Speak Out! Lesson 2. Pt required occasional min verbal cues, modeling, for volume and breath support.Pt averages the following volume levels:  Sustained AH: 90 dB  Counting:88dB  Reading (phrases):   Cognitive Exercise: 84dB Required usual min to mod verbal cues, modeling for carryover of intent /volume generating 3 phrase descriptions following structured practice.  Reviewed requirement to watch the What is Parkinson's video, reviewed [employer] ([employer]) website to locate the video and home practice sessions - he is to complete at least 1 online home practice session prior to next visit.   05/03/24: Target improving vocal quality and  increasing intensity through progressively difficulty speech tasks using Speak Out! program, lesson 1. ST leads pt through exercises providing usual model prior to pt execution. occasional min-A required to achieve target dB this date and avoid volume decay. Averages this date:  Resonant vowels -- 87 dB Sustained ah --  90 dB, holds for average 8 seconds  Counting -- 89 dB Reading phrases --  87 dB Cognitive speech task -- 77 dB.  Conversational sample of approx 5 minutes, pt averages 74 dB with rare min-A. Discussed importance of daily home practice, ideally BID. Pt reports intent to practice using online practice.   03/29/24: Evaluation completed. Introduced Reginal to [employer] (PVP) website.  Demonstrated navigation to What is Parkinson video and on line practice sessions. Initiated training in Speak Out! Lesson 1 - after initial modeling and verbal instructions, Wash achieved average of 85dB on loud Ah 5/5x, 81dB average on counting and 78dB average on reading 20/20 sentences. He required frequent mod A to carryover speaking with intent in conversation today. Requested his spouse attend ST sessions. Requested access to [employer] on [employer] website   PATIENT EDUCATION: Education details: See Treatment, See Patient Instructions, HEP, compensations for dysarthria Person educated: Patient Education method: Explanation, Demonstration, Verbal cues, and Handouts Education comprehension: returned demonstration, verbal cues required, and needs further education  HOME EXERCISE PROGRAM: SPEAK OUT!   GOALS: Goals reviewed with patient? Yes  SHORT TERM GOALS: Target date: 05/03/24   Pt will complete HEP daily with occasional min A Baseline: Goal status: NOT MET  2.  Pt will average 72dB and clear phonation 18/20 sentences Baseline:  Goal status: MET  3.  Pt will complete 2 online practice sessions Baseline:  Goal status: NOT MET  4.  Pt will access E Library and complete 1 lesson from E Library Baseline:  Goal status: NOT MET  5.  Pt will average 70dB over 8 minute conversation with occasional min A Baseline:  Goal status: NOT MET    LONG TERM GOALS: Target date: 06/21/24  Pt will complete HEP twice daily 5/7 days with rare min A Baseline:  Goal status: ONGOING  2.  Pt will average 70dB over 15 minute conversation with rare min A Baseline:  Goal status: ONGOING  3.  Pt will verbalize plan to continue Speak Out! Daily upon d/c from ST Baseline:  Goal status: ONGOING  4.  Pt will improve score on PROM Baseline:  Goal status: ONGOING  ASSESSMENT:  CLINICAL IMPRESSION: Patient is a 78 y.o. 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 He denies any s/s of dysphagia. He reports his wife often needs him to repeat himself and tells him he is speaking too low. Elim is also aware that others have difficulty hearing him and he is having to repeat often.He has difficulty communicating over the phone. Voice is hoarse, intermittently aphonic. I recommend skilled ST to maximize intelligibility for safety, to reduce caregiver burden and improve social participation.  OBJECTIVE IMPAIRMENTS: Objective impairments include attention, memory, and dysarthria. These impairments are limiting patient from effectively communicating at home and in community.Factors affecting potential to achieve goals and functional outcome are medical prognosis.. Patient will benefit from skilled SLP services to address above impairments and improve overall function.  REHAB POTENTIAL: Good  PLAN:  SLP FREQUENCY: 1-2x/week  SLP DURATION: 12 weeks  PLANNED INTERVENTIONS: Aspiration precaution training, Diet toleration management , Language facilitation,  Environmental controls, Economist, Cognitive reorganization, Internal/external aids, Functional tasks, Multimodal communication approach, SLP instruction and feedback, Compensatory strategies, Patient/family education, [phone] Treatment of speech (30 or 45 min) , and 07473 Treatment of swallowing function    Natalyn Szymanowski, Leita Caldron, CCC-SLP 06/13/2024, 3:21 PM

## 2024-06-18 ENCOUNTER — Ambulatory Visit

## 2024-06-18 ENCOUNTER — Encounter: Admitting: Speech Pathology

## 2024-06-18 DIAGNOSIS — M6281 Muscle weakness (generalized): Secondary | ICD-10-CM | POA: Diagnosis not present

## 2024-06-18 DIAGNOSIS — R41844 Frontal lobe and executive function deficit: Secondary | ICD-10-CM

## 2024-06-18 DIAGNOSIS — R278 Other lack of coordination: Secondary | ICD-10-CM | POA: Diagnosis not present

## 2024-06-18 DIAGNOSIS — R41840 Attention and concentration deficit: Secondary | ICD-10-CM | POA: Diagnosis not present

## 2024-06-18 DIAGNOSIS — R471 Dysarthria and anarthria: Secondary | ICD-10-CM | POA: Diagnosis not present

## 2024-06-18 DIAGNOSIS — R29818 Other symptoms and signs involving the nervous system: Secondary | ICD-10-CM | POA: Diagnosis not present

## 2024-06-18 NOTE — Therapy (Signed)
 OUTPATIENT OCCUPATIONAL THERAPY [facility] TREATMENT AND PROGRESS NOTE  Patient Name: Timothy Shields MRN: [id number] DOB:April 28, 1946, 78 y.o., male Today's Date: 06/18/2024  PCP: Janey Santos, MD  REFERRING PROVIDER: Morita, Hokuto, MD  END OF SESSION:   Past Medical History:  Diagnosis Date   Cerebrovascular disease    Headache(784.0)    Stroke (HCC)    3   Past Surgical History:  Procedure Laterality Date   HERNIA REPAIR     HERNIA REPAIR     Twice in the 1970's   Patient Active Problem List   Diagnosis Date Noted   Erectile dysfunction 09/13/2013   Other generalized ischemic cerebrovascular disease 07/10/2013   Other and unspecified hyperlipidemia 07/10/2013   ONSET DATE: 03/01/2024 (Date of referral)  REFERRING DIAG: G20.A1 (ICD-10-CM) - Parkinson's disease without dyskinesia, without mention of fluctuations  THERAPY DIAG:  Muscle weakness (generalized)  Frontal lobe and executive function deficit  Attention and concentration deficit  Other lack of coordination  Rationale for Evaluation and Treatment: Rehabilitation  SUBJECTIVE:   SUBJECTIVE STATEMENT: Pt reports no concerns or falls this date, reports that L shoulder only hurts when raising sheets at this time.  Pt accompanied by: self  PERTINENT HISTORY: Per Dr. Rosemarie: remote right PICA and left PCA infarcts in 1982 as well as left basal ganglia lacunar infarct in 2005 with mild residual right sided weakness with recent increase complaints of worsening gait difficulties likely multifactorial due to combination of old strokes, poststroke parkinsonism and mild spinal stenosis and spondylosis. He also has mild cognitive impairment which appears quite stable     Patient has noticed increasing tremors in his left hand as well as some increase hypophonia and drooling.  He does have some difficulty at times getting up from the chair or wearing his clothes.  He was seen at [facility] neurology for consultation and  started on Sinemet  25/100 mg 1-1/2 tablets 3 times daily. He continues to ambulate with a wheeled walker and feels his balance is poor. He has had a few falls in fact 9 in the last several months fortunately no major injury.   Had a driving evaluation from [facility] and was told not to drive. Has had 9 falls in the past year. Goes to a [employer] 3x a week.   PRECAUTIONS: Fall  WEIGHT BEARING RESTRICTIONS: No  PAIN:  Are you having pain? Pt reports chronic Rt shoulder pain but more recent Lt shoulder pain 0/10 at rest, up to 7/10 with certain activities (raising sheets)   FALLS: Has patient fallen in last 6 months? Yes. Number of falls 9  LIVING ENVIRONMENT: Lives with: lives with their spouse, daughter lives close by and son is moving closer  Lives in: House/apartment - will soon be downsizing to an apartment  Stairs: Yes: External: 4 huge stone steps steps; on right going up Has following equipment at home: Single point cane, Walker - 4 wheeled, and Grab bars  PLOF: Independent with household mobility with device, Independent with community mobility with device, Vocation/Vocational requirements: Currently still working, Theatre manager a Physiological scientist, and Leisure: collects books and baseball cards (has been collecting for 56 years); not driving (as of a few weeks ago)   PATIENT GOALS: PD management  OBJECTIVE:  Note: Objective measures were completed at Evaluation unless otherwise noted.  HAND DOMINANCE: Right  ADLs: Overall ADLs: mostly mod I; requires assistance with buttons, especially with sleeve cuffs  IADLs: Handwriting: 90% legible, Severe micrographia, and pt reports he has always written  small, but it has gotten even smaller. It is better if he thinks about what he is going to write and takes his time. There are times that he writes something down and then is unable to determine what he wrote.   MOBILITY STATUS: See PT notes  POSTURE COMMENTS:  rounded shoulders,  forward head, and weight shift left, head and trunk tilted to the L   ACTIVITY TOLERANCE: Activity tolerance: good to fair  FUNCTIONAL OUTCOME MEASURES: 05/03/2024:Fastening/unfastening 3 buttons: 61 sec Physical performance test: PPT#2 (simulated eating) 22 sec & PPT#4 (donning/doffing jacket): 21 sec  COORDINATION: 05/03/2024: 9 Hole Peg test: Right: 37 sec; Left: 33 sec Box and Blocks:  Right 47blocks, Left 47blocks  UE ROM:  WFL  MUSCLE TONE: TBD  COGNITION: Overall cognitive status: History of cognitive impairments - at baseline  OBSERVATIONS: resting LUE tremor; impulsive; mild bradykinesia                                                                                                                  TREATMENT:   Reviewed PWR!Supine (through verbal report). Pt reports no concerns, just states that sometimes has some difficulty with PWR!Twist movement (pt reports I can do it, it's just takes some time. Removed unneeded papers from pt's notebook to increase neatness and ease with finding appts (removed September calendar, kept October calendar for appts).  Educated pt in AE for doing/undoing buttons and zippers but urged to continue with completing these tasks as independently as possible, that button hook is only for as needed, educated in where to obtain this AE. Pt practiced doing and undoing 3 buttons with button hook with good understanding. Also educated pt in use of zipper hook attachment for zipping and unzipping jackets.  Reviewed coordination HEP pt verbalized this is better than I was able to do before in reference to golf ball activity. Min cues for proper form of card activities and spinning ball activity.    PATIENT EDUCATION: Education details: see above Person educated: Patient Education method: Explanation, Demonstration, and Verbal cues Education comprehension: verbalized understanding, returned demonstration, and verbal cues required  HOME EXERCISE  PROGRAM: 03/23/2024: handwriting samples 04/19/2024: PWR! Hands 04/26/2024: handwriting changes 05/01/2024: Coordination HEP 05/03/2024: buttoning 05/10/2024: Jacket handout 05/17/2024: Keeping Thinking Skills Fredericka handout, Memory strategies, preventing PD complications 05/21/24: PD ex chart 06/04/24: ALYSSA! supine  GOALS:  SHORT TERM GOALS: Target date: 05/17/2024    Pt will be independent with PD specific HEP.  Baseline: not yet initiated Goal status: IN PROGRESS  2.  Pt will verbalize understanding of adapted strategies to maximize safety and independence with ADLs/IADLs.  Baseline: not yet initiated Goal status: IN PROGRESS  3.  Will set appropriate coordination based goal. Baseline: not yet initiated Goal status: MET  LONG TERM GOALS: Target date: 06/22/24    Pt will verbalize understanding of ways to prevent future PD related complications and PD community resources.  Baseline: not yet initiated Goal status: MET  2.  Pt will write a short paragraph with  no significant decrease in size and maintain 100% legibility.  Baseline: 90% legibility severe micrographia 05/31/2024: 95% legibility with moderate micrographia Goal status: IN PROGRESS  3.  Pt will verbalize understanding of ways to keep thinking skills sharp and ways to compensate for STM changes in the future.  Baseline: not yet initiated Goal status: MET  4.  Pt will be able to place at least 5 additional blocks using R hand with completion of Box and Blocks test.  Baseline: 04/19/2024: Right - 42 blocks; Left - 49 blocks 05/03/2024: R - 47 blocks Goal status: MET   5.  Pt will demonstrate improved ease with fastening buttons as evidenced by decreasing 3 button/unbutton time by at least 5 seconds or more as appropriate.  Baseline: 04/19/2024: 42 seconds 05/03/2024: 61 seconds  05/31/2024: 32 seconds Goal status: MET  6.  Pt will demonstrate improved fine motor coordination for ADLs as evidenced by decreasing 9 hole  peg test score for each hand by at least 5 secs or more as appropriate.  Baseline: 04/19/2024: Right - 33 seconds; Left - 38 seconds 05/03/2024: 9 Hole Peg test: Right: 37 sec; Left: 33 sec 05/31/2024: Right: 25 sec; Left: 29 sec Goal status: MET   7.  Pt will demonstrate increased ease with dressing as evidenced by decreasing PPT#4 (don/ doff jacket) to 15 secs or less. Baseline: 21 sec 05/31/2024: Goal status: INITIAL  8.  Pt will demonstrate improved ease with feeding as evidenced by decreasing PPT#2 by at least 5 seconds.  Baseline: 22 seconds Goal status: MET (05/14/24: 13 sec)   ASSESSMENT:  CLINICAL IMPRESSION:  Pt participating well with coordination activities, needed minimal cueing for proper form. Pt expressed interest in obtaining button hook but does agree to attempt buttoning/unbuttoning shirts as independently as possible and that this AE is only for as needed, should he be interested in obtaining one. Pt expresses only some difficulty with PWR! Twist exercise, and would benefit from continued OT services to improve ease if possible and continue to assess and address handwriting and promote max LOI possible at this time.   PERFORMANCE DEFICITS: in functional skills including ADLs, IADLs, coordination, ROM, Fine motor control, Gross motor control, mobility, balance, decreased knowledge of precautions, decreased knowledge of use of DME, and UE functional use.   IMPAIRMENTS: are limiting patient from ADLs, IADLs, rest and sleep, leisure, and social participation.   COMORBIDITIES:  may have co-morbidities  that affects occupational performance. Patient will benefit from skilled OT to address above impairments and improve overall function.  REHAB POTENTIAL: Good  PLAN:  OT FREQUENCY: 2x/week  OT DURATION: 8 weeks  PLANNED INTERVENTIONS: 97168 OT Re-evaluation, 97535 self care/ADL training, 02889 therapeutic exercise, 97530 therapeutic activity, 97112 neuromuscular  re-education, functional mobility training, coping strategies training, patient/family education, and DME and/or AE instructions  RECOMMENDED OTHER SERVICES: N/A for this visit  CONSULTED AND AGREED WITH PLAN OF CARE: Patient  PLAN FOR NEXT SESSION:  Review supine PWR! Prn Continue to address handwriting   Rocky Dutch, OT 06/18/2024, 2:11 PM

## 2024-06-20 ENCOUNTER — Encounter: Admitting: Speech Pathology

## 2024-06-21 ENCOUNTER — Ambulatory Visit: Admitting: Occupational Therapy

## 2024-06-21 DIAGNOSIS — R278 Other lack of coordination: Secondary | ICD-10-CM

## 2024-06-21 DIAGNOSIS — R41840 Attention and concentration deficit: Secondary | ICD-10-CM | POA: Diagnosis not present

## 2024-06-21 DIAGNOSIS — R29818 Other symptoms and signs involving the nervous system: Secondary | ICD-10-CM | POA: Diagnosis not present

## 2024-06-21 DIAGNOSIS — R41844 Frontal lobe and executive function deficit: Secondary | ICD-10-CM

## 2024-06-21 DIAGNOSIS — R471 Dysarthria and anarthria: Secondary | ICD-10-CM | POA: Diagnosis not present

## 2024-06-21 DIAGNOSIS — R29898 Other symptoms and signs involving the musculoskeletal system: Secondary | ICD-10-CM

## 2024-06-21 DIAGNOSIS — M6281 Muscle weakness (generalized): Secondary | ICD-10-CM

## 2024-06-21 NOTE — Patient Instructions (Signed)
 Performing Daily Activities with Big Movements  Pick at least 2 activities a day and perform with BIG, DELIBERATE movements/effort.  Try different activities each day. This can make the activity easier and turn daily activities into exercise to prevent problems in the future!  If you are standing during the activity, make sure to keep feet apart and stand with good/big/posture.  Examples: Dressing  Pull-over shirt:  good posture, bring shirt to head (don't bring head down), deliberately push arms into sleeves Jacket:  stand with feet apart, deliberately push arms into sleeves Underwear/Pants/Shoes/Socks:  Sit, lean forward, and push each foot in deliberately 1 at a time Open hands to pull down shirt/put on socks/pull up pants--get more material in your hand  Buttoning - Open hands big before fastening each button, deliberate movement (angry buttons--push button through hole), unfasten by using pull-push method   Bathing - Wash/dry with long strokes  Brushing your teeth - Big, slow movements  Cutting food - Long deliberate cuts, put tip of knife down in front of food  Eating - Hold utensil in the middle (not the end), hold fork straight up and down to stab food  Picking up a cup/bottle - Open hand up big and get object all the way in palm  Opening jar/bottle - Move as much as you can with each turn, twist wrist  Putting on seatbelt - Feet apart, twist when reaching across body, look at where you are reaching  Hanging up clothes/getting clothes down from closet - Reach with big effort, open hand, straighten elbow  Putting away groceries/dishes - Reach with big effort  Wiping counter/table - Move in big, long strokes, open hand  Stirring while cooking - Exaggerate movement  Cleaning windows - Feet apart, move in big, long strokes  Sweeping/Raking- Feet apart and one foot in front of the other, move arms in big, long strokes  Vacuuming - Feet apart and one in front of the other,  push with big movement  Folding clothes - Exaggerate arm movements  Washing car - Move in big, long strokes, feet apart  Changing light bulb or Using a screwdriver - Move as much as you can with each turn, twist wrist  Walking into a store/restaurant - Walk with big steps, good posture, swing arms if able  Standing up from a chair/recliner/sofa - Scoot forward, lean forward, and stand with big effort  Picking up something from floor/reaching in low cabinet - Get close to object, position feet apart and one in front of the other

## 2024-06-21 NOTE — Therapy (Signed)
 OUTPATIENT OCCUPATIONAL THERAPY P[facility]'S TREATMENT AND DISCHARGE  Patient Name: Timothy Shields MRN: [id number] DOB:[birth year], 78 y.o., male Today's Date: 06/21/2024  OCCUPATIONAL THERAPY DISCHARGE SUMMARY  Visits from Start of Care: 13  Current functional level related to goals / functional outcomes: Patient has met all short-term goals and 7/8 long-term goals to date.   Remaining deficits: Pt remains limited by Parkinson's, which affects his motor movements putting him at increased need for assistance and falls; however, he has resources and HEP as needed to maintain his current level of function as much as possible.     Education / Equipment: Continue with HEP and strategies following OT d/c to maximize function and maximize safety and overall level of independence.    Patient agrees to discharge. Patient goals were met. Patient is being discharged due to being pleased with the current functional level.SABRA  PCP: Janey Santos, MD  REFERRING PROVIDER: Morita, Hokuto, MD  END OF SESSION:  OT End of Session - 06/21/24 1407     Visit Number 13    Number of Visits 17    Date for Recertification  06/22/24    Authorization Type Medicare    Progress Note Due on Visit 20    OT Start Time 1408    OT Stop Time 1447    OT Time Calculation (min) 39 min    Equipment Utilized During Treatment --    Activity Tolerance Patient tolerated treatment well    Behavior During Therapy WFL for tasks assessed/performed         Past Medical History:  Diagnosis Date   Cerebrovascular disease    Headache(784.0)    Stroke (HCC)    3   Past Surgical History:  Procedure Laterality Date   HERNIA REPAIR     HERNIA REPAIR     Twice in the 1970's   Patient Active Problem List   Diagnosis Date Noted   Erectile dysfunction 09/13/2013   Other generalized ischemic cerebrovascular disease 07/10/2013   Other and unspecified hyperlipidemia 07/10/2013   ONSET DATE: 03/01/2024 (Date of  referral)  REFERRING DIAG: G20.A1 (ICD-10-CM) - Parkinson's disease without dyskinesia, without mention of fluctuations  THERAPY DIAG:  Muscle weakness (generalized)  Attention and concentration deficit  Frontal lobe and executive function deficit  Other lack of coordination  Other symptoms and signs involving the nervous system  Other symptoms and signs involving the musculoskeletal system  Rationale for Evaluation and Treatment: Rehabilitation  SUBJECTIVE:   SUBJECTIVE STATEMENT: Pt reports he really finds benefit from [facility].   Pt accompanied by: self  PERTINENT HISTORY: Per Dr. Rosemarie: remote right PICA and left PCA infarcts in 1982 as well as left basal ganglia lacunar infarct in 2005 with mild residual right sided weakness with recent increase complaints of worsening gait difficulties likely multifactorial due to combination of old strokes, poststroke parkinsonism and mild spinal stenosis and spondylosis. He also has mild cognitive impairment which appears quite stable     Patient has noticed increasing tremors in his left hand as well as some increase hypophonia and drooling.  He does have some difficulty at times getting up from the chair or wearing his clothes.  He was seen at [facility] neurology for consultation and started on Sinemet  25/100 mg 1-1/2 tablets 3 times daily. He continues to ambulate with a wheeled walker and feels his balance is poor. He has had a few falls in fact 9 in the last several months fortunately no major injury.   Had a driving  evaluation from [facility] and was told not to drive. Has had 9 falls in the past year. Goes to a Systems analyst 3x a week.   PRECAUTIONS: Fall  WEIGHT BEARING RESTRICTIONS: No  PAIN:  Are you having pain? NO  FALLS: Has patient fallen in last 6 months? Yes. Number of falls 9  LIVING ENVIRONMENT: Lives with: lives with their spouse, daughter lives close by and son is moving closer  Lives in: House/apartment - will soon be  downsizing to an apartment  Stairs: Yes: External: 4 huge stone steps steps; on right going up Has following equipment at home: Single point cane, Walker - 4 wheeled, and Grab bars  PLOF: Independent with household mobility with device, Independent with community mobility with device, Vocation/Vocational requirements: Currently still working, Theatre manager a Physiological scientist, and Leisure: collects books and baseball cards (has been collecting for 56 years); not driving (as of a few weeks ago)   PATIENT GOALS: PD management  OBJECTIVE:  Note: Objective measures were completed at Evaluation unless otherwise noted.  HAND DOMINANCE: Right  ADLs: Overall ADLs: mostly mod I; requires assistance with buttons, especially with sleeve cuffs  IADLs: Handwriting: 90% legible, Severe micrographia, and pt reports he has always written small, but it has gotten even smaller. It is better if he thinks about what he is going to write and takes his time. There are times that he writes something down and then is unable to determine what he wrote.   MOBILITY STATUS: See PT notes  POSTURE COMMENTS:  rounded shoulders, forward head, and weight shift left, head and trunk tilted to the L   ACTIVITY TOLERANCE: Activity tolerance: good to fair  FUNCTIONAL OUTCOME MEASURES: 05/03/2024:Fastening/unfastening 3 buttons: 61 sec Physical performance test: PPT#2 (simulated eating) 22 sec & PPT#4 (donning/doffing jacket): 21 sec  COORDINATION: 05/03/2024: 9 Hole Peg test: Right: 37 sec; Left: 33 sec Box and Blocks:  Right 47blocks, Left 47blocks  UE ROM:  WFL  MUSCLE TONE: TBD  COGNITION: Overall cognitive status: History of cognitive impairments - at baseline  OBSERVATIONS: resting LUE tremor; impulsive; mild bradykinesia                                                                                                                  TREATMENT:   - Self-care/home management completed for duration as  noted below including: Objective measures assessed as noted in Goals section to determine progression towards goals. Therapist reviewed goals with patient and updated patient progression.  No additional functional limitations identified. OT educated pt on BIG ADL strategies as noted in pt instructions.   PATIENT EDUCATION: Education details: see above Person educated: Patient Education method: Explanation, Demonstration, Verbal cues, and Handouts Education comprehension: verbalized understanding, returned demonstration, and verbal cues required  HOME EXERCISE PROGRAM: 03/23/2024: handwriting samples 04/19/2024: PWR! Hands 04/26/2024: handwriting changes 05/01/2024: Coordination HEP 05/03/2024: buttoning 05/10/2024: Jacket handout 05/17/2024: Keeping Thinking Skills Fredericka handout, Memory strategies, preventing PD complications 05/21/24: PD ex chart 06/04/24: ALYSSA! Supine 06/21/2024: BIG ADLs  GOALS:  SHORT TERM GOALS: Target date: 05/17/2024    Pt will be independent with PD specific HEP.  Baseline: not yet initiated Goal status: MET  2.  Pt will verbalize understanding of adapted strategies to maximize safety and independence with ADLs/IADLs.  Baseline: not yet initiated Goal status: MET  3.  Will set appropriate coordination based goal. Baseline: not yet initiated Goal status: MET  LONG TERM GOALS: Target date: 06/22/24    Pt will verbalize understanding of ways to prevent future PD related complications and PD community resources.  Baseline: not yet initiated Goal status: MET  2.  Pt will write a short paragraph with no significant decrease in size and maintain 100% legibility.  Baseline: 90% legibility severe micrographia 05/31/2024: 95% legibility with moderate micrographia Goal status:MET  3.  Pt will verbalize understanding of ways to keep thinking skills sharp and ways to compensate for STM changes in the future.  Baseline: not yet initiated Goal status: MET  4.   Pt will be able to place at least 5 additional blocks using R hand with completion of Box and Blocks test.  Baseline: 04/19/2024: Right - 42 blocks; Left - 49 blocks 05/03/2024: R - 47 blocks Goal status: MET   5.  Pt will demonstrate improved ease with fastening buttons as evidenced by decreasing 3 button/unbutton time by at least 5 seconds or more as appropriate.  Baseline: 04/19/2024: 42 seconds 05/03/2024: 61 seconds  05/31/2024: 32 seconds Goal status: MET  6.  Pt will demonstrate improved fine motor coordination for ADLs as evidenced by decreasing 9 hole peg test score for each hand by at least 5 secs or more as appropriate.  Baseline: 04/19/2024: Right - 33 seconds; Left - 38 seconds 05/03/2024: 9 Hole Peg test: Right: 37 sec; Left: 33 sec 05/31/2024: Right: 25 sec; Left: 29 sec Goal status: MET   7.  Pt will demonstrate increased ease with dressing as evidenced by decreasing PPT#4 (don/ doff jacket) to 15 secs or less. Baseline: 21 sec 05/31/2024: Goal status: INITIAL  8.  Pt will demonstrate improved ease with feeding as evidenced by decreasing PPT#2 by at least 5 seconds.  Baseline: 22 seconds Goal status: MET (05/14/24: 13 sec)   ASSESSMENT:  CLINICAL IMPRESSION:  Pt demonstrates understanding of HEP and PD specific strategies as needed to improve independence and safety with ADL and IADL completion. Recommend he continue with these following OT d/c and return for PD screen in 6 months. Patient is appropriate for discharge and no longer demonstrates medical necessity for continued skilled occupational therapy services.  PLAN:  OT D/C Completed; return in 6 months for PD screen  CONSULTED AND AGREED WITH PLAN OF CARE: Patient  Jocelyn CHRISTELLA Bottom, OT 06/21/2024, 2:55 PM

## 2024-06-25 ENCOUNTER — Encounter: Payer: Self-pay | Admitting: Speech Pathology

## 2024-06-25 ENCOUNTER — Ambulatory Visit: Admitting: Speech Pathology

## 2024-06-25 DIAGNOSIS — R41840 Attention and concentration deficit: Secondary | ICD-10-CM | POA: Diagnosis not present

## 2024-06-25 DIAGNOSIS — M6281 Muscle weakness (generalized): Secondary | ICD-10-CM | POA: Diagnosis not present

## 2024-06-25 DIAGNOSIS — R41844 Frontal lobe and executive function deficit: Secondary | ICD-10-CM | POA: Diagnosis not present

## 2024-06-25 DIAGNOSIS — R471 Dysarthria and anarthria: Secondary | ICD-10-CM | POA: Diagnosis not present

## 2024-06-25 DIAGNOSIS — R29818 Other symptoms and signs involving the nervous system: Secondary | ICD-10-CM | POA: Diagnosis not present

## 2024-06-25 DIAGNOSIS — R278 Other lack of coordination: Secondary | ICD-10-CM | POA: Diagnosis not present

## 2024-06-25 NOTE — Addendum Note (Signed)
 Addended by: Denece Shearer A on: 06/25/2024 02:18 PM   Modules accepted: Orders

## 2024-06-25 NOTE — Therapy (Signed)
 OUTPATIENT SPEECH LANGUAGE PATHOLOGY TREATMENT NOTE & RECERTIFICATION   Patient Name: Timothy Shields MRN: [id number] DOB:09/19/1945, 78 y.o., male Today's Date: 06/25/2024  PCP: Janey Santos, MD REFERRING PROVIDER: Morita, Hokuto, MD  END OF SESSION:  End of Session - 06/25/24 1312     Visit Number 10    Number of Visits 25    Date for Recertification  07/23/24   recert   SLP Start Time 1315    SLP Stop Time  1400    SLP Time Calculation (min) 45 min    Activity Tolerance Patient tolerated treatment well           Past Medical History:  Diagnosis Date   Cerebrovascular disease    Headache(784.0)    Stroke (HCC)    3   Past Surgical History:  Procedure Laterality Date   HERNIA REPAIR     HERNIA REPAIR     Twice in the 1970's   Patient Active Problem List   Diagnosis Date Noted   Erectile dysfunction 09/13/2013   Other generalized ischemic cerebrovascular disease 07/10/2013   Other and unspecified hyperlipidemia 07/10/2013    ONSET DATE: 03/01/2024 (referral) PD sx 2-3 years ago  REFERRING DIAG:  Diagnosis  G20.A1 (ICD-10-CM) - Parkinson's disease without dyskinesia, without mention of fluctuations    THERAPY DIAG:  Dysarthria and anarthria  Rationale for Evaluation and Treatment: Rehabilitation  SUBJECTIVE:   SUBJECTIVE STATEMENT: I need to leave early today   Pt accompanied by: self  PERTINENT HISTORY: Saw a PD specialist at [facility] and reports 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 Prescribed him carbidopa  levodopa  3 and 1/2 tabs for 3 times a day. Symptoms started 2-3 years ago. Started to get the tremors last year. Reports that after starting Carbidopa  he has been able to turn easier in the bed and has been sleeping easier.  Per Dr. Rosemarie: remote right PICA and left PCA infarcts in 1982 as well as left basal ganglia lacunar infarct in 2005 with mild residual  right sided weakness with recent increase complaints of worsening gait difficulties likely multifactorial due to combination of old strokes, poststroke parkinsonism and mild spinal stenosis and spondylosis. He also has mild cognitive impairment which appears quite stable   PAIN:  Are you having pain? No  FALLS: Has patient fallen in last 6 months?  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 Pt Rating  Having a conversation with a family member or friends at home 2  Participating in conversation with strangers in a quiet place  4  Conversing with a familiar person over the telephone 4  Conversing with a stranger over the telephone 3  Being part of a conversation in a noisy environment  1  Speaking to a friend when you are emotionally upset or angry 4  Having a conversation while traveling in the car 3  Having a conversation with someone at a distance 1  1= not at all effective 4= very effective  TREATMENT DATE:   06/25/24: [name] enters with low hoarse voice stating Some days are just like this, I try to get loud and I just can't Completed SPEAK OUT! Warm ups in loud, clear voice with rare min A - Loud Ah and glides completed 10x each with average of 90dB and supervision. Counting averaged 80dB with supervision cues,with clear phonation. Paragraph reading averaged 76dB with rare min A. In Lesson 17, he completed conversation exercises describing classic movies to average  73dB with occasional min verbal cues. In unstructured conversation Chimaobi required usual mod A to speak with intent and correct hoarse low voice.   06/13/24: Ervie reports practicing twice since last session, HEP. SPEAK OUT! Lesson 16 completed today to target intelligibility. Rare min verbal cues and modeling to complete exercises accurately with intent to hit dB targets for all exercises. Reading aloud at paragraph level averaged 75dB with occasional min A. Conversation exercises averaged 74dB. In simple conversation he averaged 70dB with usual verbal and gesture cues to speak with intent  06/11/24: [name] reports inconsistent practicing - 1x a week Targeted volume and intelligibility using Speak Out! Lesson E Library 2nd week in October in A Year of Intent. Attempted Pt required rare min verbal cues, modeling, for volume and breath support. Pt averages the following volume levels:  Sustained AH: 88dB  Counting:85dB  Reading (phrases): 83dB  Cognitive Exercise: 68dB Required frequent mod verbal cues, modeling for carryover of intent in simple conversation. Conversation averaged 68dB. Completing mildly complex analogies with average of 80dB  06/04/24: Kenric continues to report inconsistent practicing. I provided [employer] phone number for him to call to see if he can get a new booklet sent as he is unable to access online resources. SPEAK OUT! Restaurant lesson completed from E Library - Estefano hit all dB targets with occasional min A - he maintained 75dB 20/20 sentences with rare min A. Continue to generate personally relevant sentences and orders to practice along with SPEAK OUT! Exercises - today we generated 5. In conversation, Helios requires usual mod verbal and gesture cues to maintain 70dB over 5 minutes.   05/28/24: Kohei has not found his workbook yet. He practices with reading and SPEAK OUT! Exercises from memory. Today, we complete SPEAK OUT exercises (syllables to  counting) with rare min visual cues. Sustained ah averaged 92dB, glide 85dB, counting 84dB. Reading aloud 4 words and explaining which does go and why with occasional min A to use intent to average 77dB. In simple conversation he required occasional gesture cues and verbal cues to maintain intentional speech and average 74dB   05/21/24: Terrez has not found his workbook nor accessed the E Library or online home practice session , there fore HEP not completed. Today, we completed the SPEAK OUT exercises - loud Ah averaged 86dB with rare min A, glides averaged   83dB; Counting 84dB with rare min modeling and gesture cues. Reading using E [employer], Kaito used intent to average 84dB with rare min A - in conversation task and unstructured conversation he requires usual min to mod verbal and gesture cues to speak with intent and average 72dB. He has not watched the What is Parkinson's video - I requested spouse attend sessions, he states she is unable to attend due to her schedule. Role play ordering at restaurant his beer and order at [facility] with rare min A to order with intent.   05/14/24: Targeted  improved volume and intelligibility using Speak  Out! Lesson 2. Pt required occasional min verbal cues, modeling, for volume and breath support.Pt averages the following volume levels:  Sustained AH: 90 dB  Counting:88dB  Reading (phrases):   Cognitive Exercise: 84dB Required usual min to mod verbal cues, modeling for carryover of intent /volume generating 3 phrase descriptions following structured practice.  Reviewed requirement to watch the What is Parkinson's video, reviewed [employer] ([employer]) website to locate the video and home practice sessions - he is to complete at least 1 online home practice session prior to next visit.   05/03/24: Target improving vocal quality and increasing intensity through progressively difficulty speech tasks using Speak Out!  program, lesson 1. ST leads pt through exercises providing usual model prior to pt execution. occasional min-A required to achieve target dB this date and avoid volume decay. Averages this date:  Resonant vowels -- 87 dB Sustained ah --  90 dB, holds for average 8 seconds  Counting -- 89 dB Reading phrases --  87 dB Cognitive speech task -- 77 dB.  Conversational sample of approx 5 minutes, pt averages 74 dB with rare min-A. Discussed importance of daily home practice, ideally BID. Pt reports intent to practice using online practice.   03/29/24: Evaluation completed. Introduced Jorma to [employer] (PVP) website. Demonstrated navigation to What is Parkinson video and on line practice sessions. Initiated training in Speak Out! Lesson 1 - after initial modeling and verbal instructions, Maximilien achieved average of 85dB on loud Ah 5/5x, 81dB average on counting and 78dB average on reading 20/20 sentences. He required frequent mod A to carryover speaking with intent in conversation today. Requested his spouse attend ST sessions. Requested access to [employer] on [employer] website   PATIENT EDUCATION: Education details: See Treatment, See Patient Instructions, HEP, compensations for dysarthria Person educated: Patient Education method: Explanation, Demonstration, Verbal cues, and Handouts Education comprehension: returned demonstration, verbal cues required, and needs further education  HOME EXERCISE PROGRAM: SPEAK OUT!   GOALS: Goals reviewed with patient? Yes  SHORT TERM GOALS: Target date: 05/03/24   Pt will complete HEP daily with occasional min A Baseline: Goal status: NOT MET  2.  Pt will average 72dB and clear phonation 18/20 sentences Baseline:  Goal status: MET  3.  Pt will complete 2 online practice sessions Baseline:  Goal status: NOT MET  4.  Pt will access E Library and complete 1 lesson from E Library Baseline:  Goal status: NOT MET  5.  Pt will average 70dB  over 8 minute conversation with occasional min A Baseline:  Goal status: NOT MET    LONG TERM GOALS: Target date: 07/23/24 (recert)  Pt will complete HEP twice daily 5/7 days with rare min A Baseline:  Goal status: ONGOING  2.  Pt will average 70dB over 15 minute conversation with rare min A Baseline:  Goal status: ONGOING  3.  Pt will verbalize plan to continue Speak Out! Daily upon d/c from ST Baseline:  Goal status: ONGOING  4.  Pt will improve score on PROM Baseline:  Goal status: ONGOING  ASSESSMENT:  CLINICAL IMPRESSION: Patient is a 78 y.o. 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 He denies any s/s of dysphagia. He reports his wife often needs him to repeat himself and tells him he is speaking too low. Mccauley is also aware that others have difficulty hearing him and he is having to repeat often.He  has difficulty communicating over the phone. Voice is hoarse, intermittently aphonic. I recommend skilled ST to maximize intelligibility for safety, to reduce caregiver burden and improve social participation.  OBJECTIVE IMPAIRMENTS: Objective impairments include attention, memory, and dysarthria. These impairments are limiting patient from effectively communicating at home and in community.Factors affecting potential to achieve goals and functional outcome are medical prognosis.. Patient will benefit from skilled SLP services to address above impairments and improve overall function.  REHAB POTENTIAL: Good  PLAN:  SLP FREQUENCY: 1-2x/week  SLP DURATION: 12 weeks  PLANNED INTERVENTIONS: Aspiration precaution training, Diet toleration management , Language facilitation, Environmental controls, Cueing hierachy, Cognitive reorganization, Internal/external aids, Functional tasks, Multimodal communication approach, SLP instruction and feedback, Compensatory strategies, Patient/family education, [phone]  Treatment of speech (30 or 45 min) , and 07473 Treatment of swallowing function  Speech Therapy Progress Note  Dates of Reporting Period: 03/29/24 to 06/25/24  Objective Reports of Subjective Statement: Keilan   Objective Measurements: See treatment above for dB measures and A required  Goal Update: Continue Goals  Plan: Continue POC  Reason Skilled Services are Required: Ketih achieves and maintains clear strong voice in therapy, however he has not achieved carryover of WNL volume and clear voice.    Dysen Edmondson, Leita Caldron, CCC-SLP 06/25/2024, 2:00 PM

## 2024-06-27 ENCOUNTER — Encounter: Payer: Self-pay | Admitting: Speech Pathology

## 2024-06-27 ENCOUNTER — Ambulatory Visit: Admitting: Speech Pathology

## 2024-06-27 DIAGNOSIS — R41844 Frontal lobe and executive function deficit: Secondary | ICD-10-CM | POA: Diagnosis not present

## 2024-06-27 DIAGNOSIS — R29818 Other symptoms and signs involving the nervous system: Secondary | ICD-10-CM | POA: Diagnosis not present

## 2024-06-27 DIAGNOSIS — R471 Dysarthria and anarthria: Secondary | ICD-10-CM | POA: Diagnosis not present

## 2024-06-27 DIAGNOSIS — R41840 Attention and concentration deficit: Secondary | ICD-10-CM | POA: Diagnosis not present

## 2024-06-27 DIAGNOSIS — M6281 Muscle weakness (generalized): Secondary | ICD-10-CM | POA: Diagnosis not present

## 2024-06-27 DIAGNOSIS — R278 Other lack of coordination: Secondary | ICD-10-CM | POA: Diagnosis not present

## 2024-06-27 NOTE — Therapy (Signed)
 OUTPATIENT SPEECH LANGUAGE PATHOLOGY TREATMENT NOTE & RECERTIFICATION   Patient Name: Timothy Shields MRN: [id number] DOB:1946/03/26, 78 y.o., male Today's Date: 06/27/2024  PCP: Timothy Santos, MD REFERRING PROVIDER: Morita, Hokuto, MD  END OF SESSION:  End of Session - 06/27/24 1322     Visit Number 11    Number of Visits 25    Date for Recertification  07/23/24    SLP Start Time 1320    SLP Stop Time  1400    SLP Time Calculation (min) 40 min    Activity Tolerance Patient tolerated treatment well           Past Medical History:  Diagnosis Date   Cerebrovascular disease    Headache(784.0)    Stroke (HCC)    3   Past Surgical History:  Procedure Laterality Date   HERNIA REPAIR     HERNIA REPAIR     Twice in the 1970's   Patient Active Problem List   Diagnosis Date Noted   Erectile dysfunction 09/13/2013   Other generalized ischemic cerebrovascular disease 07/10/2013   Other and unspecified hyperlipidemia 07/10/2013    ONSET DATE: 03/01/2024 (referral) PD sx 2-3 years ago  REFERRING DIAG:  Diagnosis  G20.A1 (ICD-10-CM) - Parkinson's disease without dyskinesia, without mention of fluctuations    THERAPY DIAG:  Dysarthria and anarthria  Rationale for Evaluation and Treatment: Rehabilitation  SUBJECTIVE:   SUBJECTIVE STATEMENT: They tell me I was always soft spoken  Pt accompanied by: self  PERTINENT HISTORY: Saw a PD specialist at [facility] and reports 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 Prescribed him carbidopa  levodopa  3 and 1/2 tabs for 3 times a day. Symptoms started 2-3 years ago. Started to get the tremors last year. Reports that after starting Carbidopa  he has been able to turn easier in the bed and has been sleeping easier.  Per Timothy Shields: remote right PICA and left PCA infarcts in 1982 as well as left basal ganglia lacunar infarct in 2005 with mild residual  right sided weakness with recent increase complaints of worsening gait difficulties likely multifactorial due to combination of old strokes, poststroke parkinsonism and mild spinal stenosis and spondylosis. He also has mild cognitive impairment which appears quite stable   PAIN:  Are you having pain? No  FALLS: Has patient fallen in last 6 months?  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 Pt Rating  Having a conversation with a family member or friends at home 2  Participating in conversation with strangers in a quiet place  4  Conversing with a familiar person over the telephone 4  Conversing with a stranger over the telephone 3  Being part of a conversation in a noisy environment  1  Speaking to a friend when you are emotionally upset or angry 4  Having a conversation while traveling in the car 3  Having a conversation with someone at a distance 1  1= not at all effective 4= very effective  TREATMENT DATE:   06/27/24: Pt enters with WNL voice - Completed SPEAK OUT! October week 3 in e Library - [name] completed each exercise accurately hitting dB targets with mod I. In conversation, he required usual mod verbal and gesture cues to carry over speaking with intent in conversation - he averaged 70dB with volume decay.   06/25/24: [name] enters with low hoarse voice stating Some days are just like this, I try to get loud and I just can't Completed SPEAK OUT! Warm ups in  loud, clear voice with rare min A - Loud Ah and glides completed 10x each with average of 90dB and supervision. Counting averaged 80dB with supervision cues,with clear phonation. Paragraph reading averaged 76dB with rare min A. In Lesson 17, he completed conversation exercises describing classic movies to average 73dB with occasional min verbal cues. In unstructured conversation [name] required usual mod A to speak with intent and correct hoarse low voice.   06/13/24: [name] reports practicing twice since last session, HEP. SPEAK OUT! Lesson 16 completed today to target intelligibility. Rare min verbal cues and modeling to complete exercises accurately with intent to hit dB targets for all exercises. Reading aloud at paragraph level averaged 75dB with occasional min A. Conversation exercises averaged 74dB. In simple conversation he averaged 70dB with usual verbal and gesture cues to speak with intent  06/11/24: [name] reports inconsistent practicing - 1x a week Targeted volume and intelligibility using Speak Out! Lesson E Library 2nd week in October in A Year of Intent. Attempted Pt required rare min verbal cues, modeling, for volume and breath support. Pt averages the following volume levels:  Sustained AH: 88dB  Counting:85dB  Reading (phrases): 83dB  Cognitive Exercise: 68dB Required frequent mod verbal cues, modeling for carryover of intent in simple conversation. Conversation averaged 68dB. Completing mildly complex analogies with average of 80dB  06/04/24: [name] continues to report inconsistent practicing. I provided [employer] phone number for him to call to see if he can get a new booklet sent as he is unable to access online resources. SPEAK OUT! Restaurant lesson completed from E Library - [name] hit all dB targets with occasional min A - he maintained 75dB 20/20 sentences with rare min A. Continue to generate personally relevant sentences and orders to practice along with SPEAK  OUT! Exercises - today we generated 5. In conversation, [name] requires usual mod verbal and gesture cues to maintain 70dB over 5 minutes.   05/28/24: Worth has not found his workbook yet. He practices with reading and SPEAK OUT! Exercises from memory. Today, we complete SPEAK OUT exercises (syllables to counting) with rare min visual cues. Sustained ah averaged 92dB, glide 85dB, counting 84dB. Reading aloud 4 words and explaining which does go and why with occasional min A to use intent to average 77dB. In simple conversation he required occasional gesture cues and verbal cues to maintain intentional speech and average 74dB   05/21/24: Kai has not found his workbook nor accessed the E Library or online home practice session , there fore HEP not completed. Today, we completed the SPEAK OUT exercises - loud Ah averaged 86dB with rare min A, glides averaged   83dB; Counting 84dB with rare min modeling and gesture cues. Reading using E [employer], Zayquan used intent to average 84dB with rare min A - in conversation task and unstructured conversation he requires usual min to mod verbal and gesture cues to speak with intent and average 72dB. He has not  watched the What is Parkinson's video - I requested spouse attend sessions, he states she is unable to attend due to her schedule. Role play ordering at restaurant his beer and order at [facility] with rare min A to order with intent.   05/14/24: Targeted  improved volume and intelligibility using Speak Out! Lesson 2. Pt required occasional min verbal cues, modeling, for volume and breath support.Pt averages the following volume levels:  Sustained AH: 90 dB  Counting:88dB  Reading (phrases):   Cognitive Exercise: 84dB Required usual min to mod verbal cues, modeling for carryover of intent /volume generating 3 phrase descriptions following structured practice.  Reviewed requirement to watch the What is Parkinson's video,  reviewed [employer] ([employer]) website to locate the video and home practice sessions - he is to complete at least 1 online home practice session prior to next visit.   05/03/24: Target improving vocal quality and increasing intensity through progressively difficulty speech tasks using Speak Out! program, lesson 1. ST leads pt through exercises providing usual model prior to pt execution. occasional min-A required to achieve target dB this date and avoid volume decay. Averages this date:  Resonant vowels -- 87 dB Sustained ah --  90 dB, holds for average 8 seconds  Counting -- 89 dB Reading phrases --  87 dB Cognitive speech task -- 77 dB.  Conversational sample of approx 5 minutes, pt averages 74 dB with rare min-A. Discussed importance of daily home practice, ideally BID. Pt reports intent to practice using online practice.   03/29/24: Evaluation completed. Introduced Emerick to [employer] (PVP) website. Demonstrated navigation to What is Parkinson video and on line practice sessions. Initiated training in Speak Out! Lesson 1 - after initial modeling and verbal instructions, Cali achieved average of 85dB on loud Ah 5/5x, 81dB average on counting and 78dB average on reading 20/20 sentences. He required frequent mod A to carryover speaking with intent in conversation today. Requested his spouse attend ST sessions. Requested access to [employer] on [employer] website   PATIENT EDUCATION: Education details: See Treatment, See Patient Instructions, HEP, compensations for dysarthria Person educated: Patient Education method: Explanation, Demonstration, Verbal cues, and Handouts Education comprehension: returned demonstration, verbal cues required, and needs further education  HOME EXERCISE PROGRAM: SPEAK OUT!   GOALS: Goals reviewed with patient? Yes  SHORT TERM GOALS: Target date: 05/03/24   Pt will complete HEP daily with occasional min A Baseline: Goal status: NOT  MET  2.  Pt will average 72dB and clear phonation 18/20 sentences Baseline:  Goal status: MET  3.  Pt will complete 2 online practice sessions Baseline:  Goal status: NOT MET  4.  Pt will access E Library and complete 1 lesson from E Library Baseline:  Goal status: NOT MET  5.  Pt will average 70dB over 8 minute conversation with occasional min A Baseline:  Goal status: NOT MET    LONG TERM GOALS: Target date: 07/23/24 (recert)  Pt will complete HEP twice daily 5/7 days with rare min A Baseline:  Goal status: ONGOING  2.  Pt will average 70dB over 15 minute conversation with rare min A Baseline:  Goal status: ONGOING  3.  Pt will verbalize plan to continue Speak Out! Daily upon d/c from ST Baseline:  Goal status: ONGOING  4.  Pt will improve score on PROM Baseline:  Goal status: ONGOING  ASSESSMENT:  CLINICAL IMPRESSION: Patient is a 78 y.o. male who was seen today for moderate  hypokinetic dysarthria due to PD as well as prior basal ganglia stroke in 2005. He denies any s/s of dysphagia. He reports his wife often needs him to repeat himself and tells him he is speaking too low. Caydin is also aware that others have difficulty hearing him and he is having to repeat often.He has difficulty communicating over the phone. Voice is hoarse, intermittently aphonic. I recommend skilled ST to maximize intelligibility for safety, to reduce caregiver burden and improve social participation.  OBJECTIVE IMPAIRMENTS: Objective impairments include attention, memory, and dysarthria. These impairments are limiting patient from effectively communicating at home and in community.Factors affecting potential to achieve goals and functional outcome are medical prognosis.. Patient will benefit from skilled SLP services to address above impairments and improve overall function.  REHAB POTENTIAL: Good  PLAN:  SLP FREQUENCY: 1-2x/week  SLP DURATION: 12 weeks  PLANNED INTERVENTIONS:  Aspiration precaution training, Diet toleration management , Language facilitation, Environmental controls, Cueing hierachy, Cognitive reorganization, Internal/external aids, Functional tasks, Multimodal communication approach, SLP instruction and feedback, Compensatory strategies, Patient/family education, [phone] Treatment of speech (30 or 45 min) , and 07473 Treatment of swallowing function  .    Raechell Singleton, Leita Caldron, CCC-SLP 06/27/2024, 2:04 PM

## 2024-07-02 ENCOUNTER — Encounter: Payer: Self-pay | Admitting: Speech Pathology

## 2024-07-02 ENCOUNTER — Ambulatory Visit: Admitting: Speech Pathology

## 2024-07-02 DIAGNOSIS — R41844 Frontal lobe and executive function deficit: Secondary | ICD-10-CM | POA: Diagnosis not present

## 2024-07-02 DIAGNOSIS — R41840 Attention and concentration deficit: Secondary | ICD-10-CM | POA: Diagnosis not present

## 2024-07-02 DIAGNOSIS — M6281 Muscle weakness (generalized): Secondary | ICD-10-CM | POA: Diagnosis not present

## 2024-07-02 DIAGNOSIS — R471 Dysarthria and anarthria: Secondary | ICD-10-CM | POA: Diagnosis not present

## 2024-07-02 DIAGNOSIS — R278 Other lack of coordination: Secondary | ICD-10-CM | POA: Diagnosis not present

## 2024-07-02 DIAGNOSIS — R29818 Other symptoms and signs involving the nervous system: Secondary | ICD-10-CM | POA: Diagnosis not present

## 2024-07-02 NOTE — Therapy (Signed)
 OUTPATIENT SPEECH LANGUAGE PATHOLOGY TREATMENT NOTE & RECERTIFICATION   Patient Name: Timothy Shields MRN: [id number] DOB:[birth year], 78 y.o., male Today's Date: 07/02/2024  PCP: Janey Santos, MD REFERRING PROVIDER: Morita, Hokuto, MD  END OF SESSION:  End of Session - 07/02/24 1317     Visit Number 12    Number of Visits 25    Date for Recertification  07/23/24    SLP Start Time 1317    SLP Stop Time  1400    SLP Time Calculation (min) 43 min    Activity Tolerance Patient tolerated treatment well           Past Medical History:  Diagnosis Date   Cerebrovascular disease    Headache(784.0)    Stroke (HCC)    3   Past Surgical History:  Procedure Laterality Date   HERNIA REPAIR     HERNIA REPAIR     Twice in the 1970's   Patient Active Problem List   Diagnosis Date Noted   Erectile dysfunction 09/13/2013   Other generalized ischemic cerebrovascular disease 07/10/2013   Other and unspecified hyperlipidemia 07/10/2013    ONSET DATE: 03/01/2024 (referral) PD sx 2-3 years ago  REFERRING DIAG:  Diagnosis  G20.A1 (ICD-10-CM) - Parkinson's disease without dyskinesia, without mention of fluctuations    THERAPY DIAG:  Dysarthria and anarthria  Rationale for Evaluation and Treatment: Rehabilitation  SUBJECTIVE:   SUBJECTIVE STATEMENT: They tell me I was always soft spoken  Pt accompanied by: self  PERTINENT HISTORY: Saw a PD specialist at [facility] and reports 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 Prescribed him carbidopa  levodopa  3 and 1/2 tabs for 3 times a day. Symptoms started 2-3 years ago. Started to get the tremors last year. Reports that after starting Carbidopa  he has been able to turn easier in the bed and has been sleeping easier.  Per Dr. Rosemarie: remote right PICA and left PCA infarcts in 1982 as well as left basal ganglia lacunar infarct in 2005 with mild residual  right sided weakness with recent increase complaints of worsening gait difficulties likely multifactorial due to combination of old strokes, poststroke parkinsonism and mild spinal stenosis and spondylosis. He also has mild cognitive impairment which appears quite stable   PAIN:  Are you having pain? No  FALLS: Has patient fallen in last 6 months?  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 Pt Rating  Having a conversation with a family member or friends at home 2  Participating in conversation with strangers in a quiet place  4  Conversing with a familiar person over the telephone 4  Conversing with a stranger over the telephone 3  Being part of a conversation in a noisy environment  1  Speaking to a friend when you are emotionally upset or angry 4  Having a conversation while traveling in the car 3  Having a conversation with someone at a distance 1  1= not at all effective 4= very effective  TREATMENT DATE:   07/02/24: Timothy Shields enters with WNL - Targeted recall of SPEAK OUT! Exercises for practice from memory - Timothy Shields required verbal cues to recall each exercise - he has not accessed comptroller or [employer]. Timothy Shields maintains target dB levels with mod I for all exercises. In conversation Timothy Shields carried over volume and intent to average 72dB over 15 minute conversation. He required mod verbal cues to verbalize need to continue HEP upon d/c from ST.   06/27/24: Pt  enters with WNL voice - Completed SPEAK OUT! October week 3 in e Library - Yancarlos completed each exercise accurately hitting dB targets with mod I. In conversation, he required usual mod verbal and gesture cues to carry over speaking with intent in conversation - he averaged 70dB with volume decay.   06/25/24: Timothy Shields enters with low hoarse voice stating Some days are just like this, I try to get loud and I just can't Completed SPEAK OUT! Warm ups in loud, clear voice with rare min A - Loud Ah and glides completed 10x each with average of 90dB and supervision. Counting averaged 80dB with supervision cues,with clear phonation. Paragraph reading averaged 76dB with rare min A. In Lesson 17, he completed conversation exercises describing classic movies to average 73dB with occasional min verbal cues. In unstructured conversation Timothy Shields required usual mod A to speak with intent and correct hoarse low voice.   06/13/24: Timothy Shields reports practicing twice since last session, HEP. SPEAK OUT! Lesson 16 completed today to target intelligibility. Rare min verbal cues and modeling to complete exercises accurately with intent to hit dB targets for all exercises. Reading aloud at paragraph level averaged 75dB with occasional min A. Conversation exercises averaged 74dB. In simple conversation he averaged 70dB with usual verbal and gesture cues to speak with intent  06/11/24: Timothy Shields reports inconsistent practicing - 1x a week Targeted volume and intelligibility using Speak Out! Lesson E Library 2nd week in October in A [employer] Intent. Attempted Pt required rare min verbal cues, modeling, for volume and breath support. Pt averages the following volume levels:  Sustained AH: 88dB  Counting:85dB  Reading (phrases): 83dB  Cognitive Exercise: 68dB Required frequent mod verbal cues, modeling for carryover of intent in simple conversation. Conversation averaged 68dB. Completing mildly complex analogies with average of  80dB  06/04/24: Timothy Shields continues to report inconsistent practicing. I provided [facility] phone number for him to call to see if he can get a new booklet sent as he is unable to access online resources. SPEAK OUT! Restaurant lesson completed from E Library - Timothy Shields hit all dB targets with occasional min A - he maintained 75dB 20/20 sentences with rare min A. Continue to generate personally relevant sentences and orders to practice along with SPEAK OUT! Exercises - today we generated 5. In conversation, Timothy Shields requires usual mod verbal and gesture cues to maintain 70dB over 5 minutes.   05/28/24: Timothy Shields has not found his workbook yet. He practices with reading and SPEAK OUT! Exercises from memory. Today, we complete SPEAK OUT exercises (syllables to counting) with rare min visual cues. Sustained ah averaged 92dB, glide 85dB, counting 84dB. Reading aloud 4 words and explaining which does go and why with occasional min A to use intent to average 77dB. In simple conversation he required occasional gesture cues and verbal cues to maintain intentional speech and average 74dB   05/21/24: Timothy Shields has not found his workbook nor accessed the E Library or online home practice session , there fore  HEP not completed. Today, we completed the SPEAK OUT exercises - loud Ah averaged 86dB with rare min A, glides averaged   83dB; Counting 84dB with rare min modeling and gesture cues. Reading using E [employer], Timothy Shields used intent to average 84dB with rare min A - in conversation task and unstructured conversation he requires usual min to mod verbal and gesture cues to speak with intent and average 72dB. He has not watched the What is Parkinson's video - I requested spouse attend sessions, he states she is unable to attend due to her schedule. Role play ordering at restaurant his beer and order at [facility] with rare min A to order with intent.   05/14/24: Targeted  improved volume  and intelligibility using Speak Out! Lesson 2. Pt required occasional min verbal cues, modeling, for volume and breath support.Pt averages the following volume levels:  Sustained AH: 90 dB  Counting:88dB  Reading (phrases):   Cognitive Exercise: 84dB Required usual min to mod verbal cues, modeling for carryover of intent /volume generating 3 phrase descriptions following structured practice.  Reviewed requirement to watch the What is Parkinson's video, reviewed [employer] ([employer]) website to locate the video and home practice sessions - he is to complete at least 1 online home practice session prior to next visit.   05/03/24: Target improving vocal quality and increasing intensity through progressively difficulty speech tasks using Speak Out! program, lesson 1. ST leads pt through exercises providing usual model prior to pt execution. occasional min-A required to achieve target dB this date and avoid volume decay. Averages this date:  Resonant vowels -- 87 dB Sustained ah --  90 dB, holds for average 8 seconds  Counting -- 89 dB Reading phrases --  87 dB Cognitive speech task -- 77 dB.  Conversational sample of approx 5 minutes, pt averages 74 dB with rare min-A. Discussed importance of daily home practice, ideally BID. Pt reports intent to practice using online practice.   03/29/24: Evaluation completed. Introduced Timothy Shields to [employer] ([employer]) website. Demonstrated navigation to What is Parkinson video and on line practice sessions. Initiated training in Speak Out! Lesson 1 - after initial modeling and verbal instructions, Timothy Shields achieved average of 85dB on loud Ah 5/5x, 81dB average on counting and 78dB average on reading 20/20 sentences. He required frequent mod A to carryover speaking with intent in conversation today. Requested his spouse attend ST sessions. Requested access to [employer] on [employer] website   PATIENT EDUCATION: Education details: See Treatment, See  Patient Instructions, HEP, compensations for dysarthria Person educated: Patient Education method: Explanation, Demonstration, Verbal cues, and Handouts Education comprehension: returned demonstration, verbal cues required, and needs further education  HOME EXERCISE PROGRAM: SPEAK OUT!   GOALS: Goals reviewed with patient? Yes  SHORT TERM GOALS: Target date: 05/03/24   Pt will complete HEP daily with occasional min A Baseline: Goal status: NOT MET  2.  Pt will average 72dB and clear phonation 18/20 sentences Baseline:  Goal status: MET  3.  Pt will complete 2 online practice sessions Baseline:  Goal status: NOT MET  4.  Pt will access E Library and complete 1 lesson from E Library Baseline:  Goal status: NOT MET  5.  Pt will average 70dB over 8 minute conversation with occasional min A Baseline:  Goal status: NOT MET    LONG TERM GOALS: Target date: 07/23/24 (recert)  Pt will complete HEP twice daily 5/7 days with rare min  A Baseline:  Goal status: ONGOING  2.  Pt will average 70dB over 15 minute conversation with rare min A Baseline:  Goal status: ONGOING  3.  Pt will verbalize plan to continue Speak Out! Daily upon d/c from ST Baseline:  Goal status: ONGOING  4.  Pt will improve score on PROM Baseline:  Goal status: ONGOING  ASSESSMENT:  CLINICAL IMPRESSION: Patient is a 78 y.o. 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 He denies any s/s of dysphagia. He reports his wife often needs him to repeat himself and tells him he is speaking too low. Kaisyn is also aware that others have difficulty hearing him and he is having to repeat often.He has difficulty communicating over the phone. Voice is hoarse, intermittently aphonic. I recommend skilled ST to maximize intelligibility for safety, to reduce caregiver burden and improve social participation.  OBJECTIVE IMPAIRMENTS: Objective  impairments include attention, memory, and dysarthria. These impairments are limiting patient from effectively communicating at home and in community.Factors affecting potential to achieve goals and functional outcome are medical prognosis.. Patient will benefit from skilled SLP services to address above impairments and improve overall function.  REHAB POTENTIAL: Good  PLAN:  SLP FREQUENCY: 1-2x/week  SLP DURATION: 12 weeks  PLANNED INTERVENTIONS: Aspiration precaution training, Diet toleration management , Language facilitation, Environmental controls, Cueing hierachy, Cognitive reorganization, Internal/external aids, Functional tasks, Multimodal communication approach, SLP instruction and feedback, Compensatory strategies, Patient/family education, [phone] Treatment of speech (30 or 45 min) , and [id number] Treatment of swallowing function  .    Courtlynn Holloman, Leita Caldron, CCC-SLP 07/02/2024, 1:17 PM

## 2024-07-03 DIAGNOSIS — G20A1 Parkinson's disease without dyskinesia, without mention of fluctuations: Secondary | ICD-10-CM | POA: Diagnosis not present

## 2024-07-04 ENCOUNTER — Encounter: Payer: Self-pay | Admitting: Speech Pathology

## 2024-07-04 ENCOUNTER — Ambulatory Visit: Admitting: Speech Pathology

## 2024-07-04 DIAGNOSIS — R278 Other lack of coordination: Secondary | ICD-10-CM | POA: Diagnosis not present

## 2024-07-04 DIAGNOSIS — M6281 Muscle weakness (generalized): Secondary | ICD-10-CM | POA: Diagnosis not present

## 2024-07-04 DIAGNOSIS — R471 Dysarthria and anarthria: Secondary | ICD-10-CM | POA: Diagnosis not present

## 2024-07-04 DIAGNOSIS — R41844 Frontal lobe and executive function deficit: Secondary | ICD-10-CM | POA: Diagnosis not present

## 2024-07-04 DIAGNOSIS — R29818 Other symptoms and signs involving the nervous system: Secondary | ICD-10-CM | POA: Diagnosis not present

## 2024-07-04 DIAGNOSIS — R41840 Attention and concentration deficit: Secondary | ICD-10-CM | POA: Diagnosis not present

## 2024-07-04 NOTE — Therapy (Signed)
 OUTPATIENT SPEECH LANGUAGE PATHOLOGY TREATMENT NOTE & DISCHARGE SUMMARY   Patient Name: Timothy Shields MRN: [id number] DOB:1946/08/03, 78 y.o., male Today's Date: 07/04/2024  PCP: Janey Santos, MD REFERRING PROVIDER: Morita, Hokuto, MD  END OF SESSION:  End of Session - 07/04/24 1320     Visit Number 13    Number of Visits 25    Date for Recertification  07/23/24    SLP Start Time 1320    SLP Stop Time  1400    SLP Time Calculation (min) 40 min    Activity Tolerance Patient tolerated treatment well           Past Medical History:  Diagnosis Date   Cerebrovascular disease    Headache(784.0)    Stroke (HCC)    3   Past Surgical History:  Procedure Laterality Date   HERNIA REPAIR     HERNIA REPAIR     Twice in the 1970's   Patient Active Problem List   Diagnosis Date Noted   Erectile dysfunction 09/13/2013   Other generalized ischemic cerebrovascular disease 07/10/2013   Other and unspecified hyperlipidemia 07/10/2013    ONSET DATE: 03/01/2024 (referral) PD sx 2-3 years ago  REFERRING DIAG:  Diagnosis  G20.A1 (ICD-10-CM) - Parkinson's disease without dyskinesia, without mention of fluctuations    THERAPY DIAG:  Dysarthria and anarthria  Rationale for Evaluation and Treatment: Rehabilitation  SUBJECTIVE:   SUBJECTIVE STATEMENT: They tell me I was always soft spoken  Pt accompanied by: self  PERTINENT HISTORY: Saw a PD specialist at [facility] and reports 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 Prescribed him carbidopa  levodopa  3 and 1/2 tabs for 3 times a day. Symptoms started 2-3 years ago. Started to get the tremors last year. Reports that after starting Carbidopa  he has been able to turn easier in the bed and has been sleeping easier.  Per Dr. Rosemarie: remote right PICA and left PCA infarcts in 1982 as well as left basal ganglia lacunar infarct in 2005 with mild residual  right sided weakness with recent increase complaints of worsening gait difficulties likely multifactorial due to combination of old strokes, poststroke parkinsonism and mild spinal stenosis and spondylosis. He also has mild cognitive impairment which appears quite stable   PAIN:  Are you having pain? No  FALLS: Has patient fallen in last 6 months?  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 Pt Rating  Having a conversation with a family member or friends at home 2  Participating in conversation with strangers in a quiet place  4  Conversing with a familiar person over the telephone 4  Conversing with a stranger over the telephone 3  Being part of a conversation in a noisy environment  1  Speaking to a friend when you are emotionally upset or angry 4  Having a conversation while traveling in the car 3  Having a conversation with someone at a distance 1  1= not at all effective 4= very effective  TREATMENT DATE:   07/04/24: [name] enters with clear phonation and WNL volume. Targeted recall of SPEAK OUT! First 5 exercises with rare min A - he hits dB targets for all exercises with supervision cues - counting 83dB; reading averaged 76dB. Conversation re: his first job and favorite charity he averaged 70dB with occasional min gesture cues. Xabi verbalized my recommendation to practice SPEAK OUT! 5/7 days. PROM re-administered - Per's score improved from 22 to  24  07/02/24: Riely enters with WNL - Targeted recall of SPEAK OUT! Exercises for practice from memory - Linnie required verbal cues to recall each exercise - he has not accessed comptroller or [employer]. Arby maintains target dB levels with mod I for all exercises. In conversation Knox carried over volume and intent to average 72dB over 15 minute conversation. He required mod verbal cues to verbalize need to continue HEP upon d/c from ST.   06/27/24: Pt enters with WNL voice - Completed SPEAK OUT! October week 3 in e Library - Jaymason completed each exercise accurately hitting dB targets with mod I. In conversation, he required usual mod verbal and gesture cues to carry over speaking with intent in conversation - he averaged 70dB with volume decay.   06/25/24: Anais enters with low hoarse voice stating Some days are just like this, I try to get loud and I just can't Completed SPEAK OUT! Warm ups in loud, clear voice with rare min A - Loud Ah and glides completed 10x each with average of 90dB and supervision. Counting averaged 80dB with supervision cues,with clear phonation. Paragraph reading averaged 76dB with rare min A. In Lesson 17, he completed conversation exercises describing classic movies to average 73dB with occasional min verbal cues. In unstructured conversation Gerhart required usual mod A to speak with intent and correct hoarse low voice.   06/13/24: Nkosi reports practicing twice since last session, HEP. SPEAK OUT! Lesson 16 completed today to target intelligibility. Rare min verbal cues and modeling to complete exercises accurately with intent to hit dB targets for all exercises. Reading aloud at paragraph level averaged 75dB with occasional min A. Conversation exercises averaged 74dB. In simple conversation he averaged 70dB with usual verbal and gesture cues to speak with intent  06/11/24: [name] reports inconsistent practicing - 1x a week Targeted volume and  intelligibility using Speak Out! Lesson E Library 2nd week in October in A Year [employer] Intent. Attempted Pt required rare min verbal cues, modeling, for volume and breath support. Pt averages the following volume levels:  Sustained AH: 88dB  Counting:85dB  Reading (phrases): 83dB  Cognitive Exercise: 68dB Required frequent mod verbal cues, modeling for carryover of intent in simple conversation. Conversation averaged 68dB. Completing mildly complex analogies with average of 80dB  06/04/24: Undra continues to report inconsistent practicing. I provided [facility] [employer] phone number for him to call to see if he can get a new booklet sent as he is unable to access online resources. SPEAK OUT! Restaurant lesson completed from E Library - Wynton hit all dB targets with occasional min A - he maintained 75dB 20/20 sentences with rare min A. Continue to generate personally relevant sentences and orders to practice along with SPEAK OUT! Exercises - today we generated 5. In conversation, Ranulfo requires usual mod verbal and gesture cues to maintain 70dB over 5 minutes.   05/28/24: Tylee has not found his workbook yet. He practices with reading and SPEAK OUT! Exercises from memory. Today, we complete SPEAK OUT exercises (syllables to  counting) with rare min visual cues. Sustained ah averaged 92dB, glide 85dB, counting 84dB. Reading aloud 4 words and explaining which does go and why with occasional min A to use intent to average 77dB. In simple conversation he required occasional gesture cues and verbal cues to maintain intentional speech and average 74dB   05/21/24: Lizzie has not found his workbook nor accessed the E Library or online home practice session , there fore HEP not completed. Today, we completed the SPEAK OUT exercises - loud Ah averaged 86dB with rare min A, glides averaged   83dB; Counting 84dB with rare min modeling and gesture cues. Reading using E [employer], [place]  used intent to average 84dB with rare min A - in conversation task and unstructured conversation he requires usual min to mod verbal and gesture cues to speak with intent and average 72dB. He has not watched the What is Parkinson's video - I requested spouse attend sessions, he states she is unable to attend due to her schedule. Role play ordering at restaurant his beer and order at [facility] with rare min A to order with intent.   05/14/24: Targeted  improved volume and intelligibility using Speak Out! Lesson 2. Pt required occasional min verbal cues, modeling, for volume and breath support.Pt averages the following volume levels:  Sustained AH: 90 dB  Counting:88dB  Reading (phrases):   Cognitive Exercise: 84dB Required usual min to mod verbal cues, modeling for carryover of intent /volume generating 3 phrase descriptions following structured practice.  Reviewed requirement to watch the What is Parkinson's video, reviewed [employer] ([facility]) website to locate the video and home practice sessions - he is to complete at least 1 online home practice session prior to next visit.   05/03/24: Target improving vocal quality and increasing intensity through progressively difficulty speech tasks using Speak Out! program, lesson 1. ST leads pt through exercises providing usual model prior to pt execution. occasional min-A required to achieve target dB this date and avoid volume decay. Averages this date:  Resonant vowels -- 87 dB Sustained ah --  90 dB, holds for average 8 seconds  Counting -- 89 dB Reading phrases --  87 dB Cognitive speech task -- 77 dB.  Conversational sample of approx 5 minutes, pt averages 74 dB with rare min-A. Discussed importance of daily home practice, ideally BID. Pt reports intent to practice using online practice.   03/29/24: Evaluation completed. Introduced Urho to [employer] ([employer]P) website. Demonstrated navigation to What is Parkinson  video and on line practice sessions. Initiated training in Speak Out! Lesson 1 - after initial modeling and verbal instructions, Hari achieved average of 85dB on loud Ah 5/5x, 81dB average on counting and 78dB average on reading 20/20 sentences. He required frequent mod A to carryover speaking with intent in conversation today. Requested his spouse attend ST sessions. Requested access to [employer] on [employer] website   PATIENT EDUCATION: Education details: See Treatment, See Patient Instructions, HEP, compensations for dysarthria Person educated: Patient Education method: Explanation, Demonstration, Verbal cues, and Handouts Education comprehension: returned demonstration, verbal cues required, and needs further education  HOME EXERCISE PROGRAM: SPEAK OUT!   GOALS: Goals reviewed with patient? Yes  SHORT TERM GOALS: Target date: 05/03/24   Pt will complete HEP daily with occasional min A Baseline: Goal status: NOT MET  2.  Pt will average 72dB and clear phonation 18/20 sentences Baseline:  Goal status: MET  3.  Pt will  complete 2 online practice sessions Baseline:  Goal status: NOT MET  4.  Pt will access E Library and complete 1 lesson from E Library Baseline:  Goal status: NOT MET  5.  Pt will average 70dB over 8 minute conversation with occasional min A Baseline:  Goal status: NOT MET    LONG TERM GOALS: Target date: 07/23/24 (recert)  Pt will complete HEP twice daily 5/7 days with rare min A Baseline:  Goal status: NOT MET  2.  Pt will average 70dB over 15 minute conversation with rare min A Baseline:  Goal status: MET  3.  Pt will verbalize plan to continue Speak Out! Daily upon d/c from ST Baseline:  Goal status: MET  4.  Pt will improve score on PROM Baseline:  Goal status: MET  ASSESSMENT:  CLINICAL IMPRESSION: Patient is a 78 y.o. 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 He  denies any s/s of dysphagia. Capers has made excellent progress this course of ST. He has improved to WNL volume in speech therapy and maintains clear phonation. Carryover of HEP has been inconsistent. Requests for spouse to attend ST did not carry over. He did not access [employer] on line sessions or E Library. Gregrey verbalizes need for daily practice upon d/c. Goals met, d/c ST. He is in agreement  OBJECTIVE IMPAIRMENTS: Objective impairments include attention, memory, and dysarthria. These impairments are limiting patient from effectively communicating at home and in community.Factors affecting potential to achieve goals and functional outcome are medical prognosis.. Patient will benefit from skilled SLP services to address above impairments and improve overall function.  REHAB POTENTIAL: Good  PLAN:  SLP FREQUENCY: 1-2x/week  SLP DURATION: 12 weeks  PLANNED INTERVENTIONS: Aspiration precaution training, Diet toleration management , Language facilitation, Environmental controls, Cueing hierachy, Cognitive reorganization, Internal/external aids, Functional tasks, Multimodal communication approach, SLP instruction and feedback, Compensatory strategies, Patient/family education, [phone] Treatment of speech (30 or 45 min) , and 07473 Treatment of swallowing function  SPEECH THERAPY DISCHARGE SUMMARY  Visits from Start of Care: 13  Current functional level related to goals / functional outcomes: See goals above   Remaining deficits: Mild hypokinetic dysarthria, MCI   Education / Equipment: HEP for dysarthria; compensations for dysarthria   Patient agrees to discharge. Patient goals were met. Patient is being discharged due to meeting the stated rehab goals..   .    Katharine Rochefort, Leita Caldron, CCC-SLP 07/04/2024, 2:03 PM

## 2024-07-18 DIAGNOSIS — R1312 Dysphagia, oropharyngeal phase: Secondary | ICD-10-CM | POA: Diagnosis not present

## 2024-07-18 DIAGNOSIS — G20A1 Parkinson's disease without dyskinesia, without mention of fluctuations: Secondary | ICD-10-CM | POA: Diagnosis not present

## 2024-09-27 ENCOUNTER — Telehealth: Payer: Self-pay | Admitting: Neurology

## 2024-09-27 ENCOUNTER — Other Ambulatory Visit: Payer: Self-pay

## 2024-09-27 MED ORDER — CARBIDOPA-LEVODOPA ER 50-200 MG PO TBCR: 1.0000 | EXTENDED_RELEASE_TABLET | Freq: Two times a day (BID) | ORAL | 1 refills | Status: AC

## 2024-09-27 NOTE — Telephone Encounter (Signed)
 Called pt back and let him know being that our office didn't prescribe 3 times a day for medication and  Genetta Potters did that he will need to ask them to send the increased script to his pharmacy. Told pt that I can send in what we gave him in the mean time.   Sent script to pharmacy.

## 2024-09-27 NOTE — Telephone Encounter (Signed)
 Patient said [facility] physician changed to 3 tablets a day for carbidopa -levodopa  (SINEMET  CR) 50-200 MG tablet. Asking Dr. Rosemarie to allow the pharmacy to fill the prescription. Would like a call back.

## 2024-10-10 ENCOUNTER — Other Ambulatory Visit: Payer: Self-pay

## 2024-10-10 ENCOUNTER — Encounter: Payer: Self-pay | Admitting: Occupational Therapy

## 2024-10-10 ENCOUNTER — Ambulatory Visit: Attending: Neurology | Admitting: Occupational Therapy

## 2024-10-10 DIAGNOSIS — M6281 Muscle weakness (generalized): Secondary | ICD-10-CM

## 2024-10-10 DIAGNOSIS — R29898 Other symptoms and signs involving the musculoskeletal system: Secondary | ICD-10-CM

## 2024-10-10 DIAGNOSIS — R29818 Other symptoms and signs involving the nervous system: Secondary | ICD-10-CM

## 2024-10-10 DIAGNOSIS — R278 Other lack of coordination: Secondary | ICD-10-CM

## 2024-10-10 NOTE — Therapy (Unsigned)
 " OUTPATIENT OCCUPATIONAL THERAPY PARKINSON'S EVALUATION  Patient Name: Timothy Shields MRN: [id number] DOB:August 02, 1946, 79 y.o., male Today's Date: 10/10/2024  PCP: Janey Santos, MD  REFERRING PROVIDER: Marci Cambric, MD   END OF SESSION:  OT End of Session - 10/10/24 1318     Visit Number 1    Number of Visits 7    Date for Recertification  11/30/24    Authorization Type Medicare    OT Start Time 1319    OT Stop Time 1401    OT Time Calculation (min) 42 min    Activity Tolerance Patient tolerated treatment well    Behavior During Therapy WFL for tasks assessed/performed         Past Medical History:  Diagnosis Date   Cerebrovascular disease    Headache(784.0)    Stroke (HCC)    3   Past Surgical History:  Procedure Laterality Date   HERNIA REPAIR     HERNIA REPAIR     Twice in the 1970's   Patient Active Problem List   Diagnosis Date Noted   Erectile dysfunction 09/13/2013   Other generalized ischemic cerebrovascular disease 07/10/2013   Other and unspecified hyperlipidemia 07/10/2013   ONSET DATE: 03/01/2024 (Date of referral)  REFERRING DIAG: G20.A1 (ICD-10-CM) - Parkinson's disease without dyskinesia, without mention of fluctuations  THERAPY DIAG:  Muscle weakness (generalized)  Other lack of coordination  Other symptoms and signs involving the nervous system  Other symptoms and signs involving the musculoskeletal system  Rationale for Evaluation and Treatment: Rehabilitation  SUBJECTIVE:   SUBJECTIVE STATEMENT: Pt has been taking PD medications with significant functional improvements. He has been rowing and has personal training 3xweek.   Pt accompanied by: self  PERTINENT HISTORY: CVD, CVA, HLD, PD, and history of falls  PRECAUTIONS: Fall  WEIGHT BEARING RESTRICTIONS: No  PAIN:  Are you having pain? Yes - baseline 2-3/10 Right hip pain - reports this pain loosens up throughout the day and has pain first thing in the morning and when he  goes to bed  FALLS: Has patient fallen in last 6 months? No  LIVING ENVIRONMENT: Lives with: lives with their spouse, daughter lives close by and son is living in pt's prior house  Lives in: apartment -has downsized to 800 square feet Stairs: Has following equipment at home: Single point cane, Environmental Consultant - 4 wheeled, and Grab bars; trekking poles for long distances/uneven surfaces; walk-in shower with a built-in seat  PLOF: Independent with household mobility with device, Independent with community mobility with device, Vocation/Vocational requirements: Currently still working, publishing copy, and Leisure: collects books and baseball cards (has been collecting for 56 years); not driving (as of a few weeks ago)   PATIENT GOALS: PD management  OBJECTIVE:  Note: Objective measures were completed at Evaluation unless otherwise noted.  HAND DOMINANCE: Right  ADLs: Overall ADLs: mostly mod I  IADLs: Handwriting: 100% legible, Increased time, Mild micrographia, and Uses foam tubing on writing utensil; uses PWR! UP to reset  MOBILITY STATUS: mod I  ACTIVITY TOLERANCE: Activity tolerance: good to fair  FUNCTIONAL OUTCOME MEASURES: 05/03/2024:Fastening/unfastening 3 buttons: 61 sec Physical performance test: PPT#2 (simulated eating) 22 sec & PPT#4 (donning/doffing jacket): 21 sec  10/10/2024:Fastening/unfastening 3 buttons: 41 sec Physical performance test: PPT#2 (simulated eating) 14 sec & PPT#4 (donning/doffing jacket): 16 sec  COORDINATION: 05/03/2024: 9 Hole Peg test: Right: 37 sec; Left: 33 sec Box and Blocks:  Right 47blocks, Left 47blocks  10/10/2024: 9 Hole Peg test: Right:  36 sec; Left: 33 sec Box and Blocks:  Right 41 blocks, Left 45 blocks  UE ROM:  WFL  MUSCLE TONE: WFL  COGNITION: Overall cognitive status: History of cognitive impairments - at baseline  OBSERVATIONS: resting LUE tremor; impulsive; mild bradykinesia                                                                                                                   TREATMENT:   - Self-care/home management completed for duration as noted below including: OT educated pt on rehabilitation process and results of objective measures in relation to pt specific goals.   PATIENT EDUCATION: Education details: see above Person educated: Patient Education method: Explanation, Demonstration, and Verbal cues Education comprehension: verbalized understanding  HOME EXERCISE PROGRAM: N/A for this visit  GOALS:  SHORT TERM GOALS: Target date: 11/07/2024      Pt will be independent with PD specific HEP.  Baseline: not yet reviewed Goal status: INITIAL  2.  Pt will verbalize understanding of adapted strategies to maximize safety and independence with ADLs/IADLs.  Baseline: not yet initiated Goal status: INITIAL  LONG TERM GOALS: Target date: 11/30/2024     Patient will demonstrate understanding and effective use of their PD Binder (including HEP instructions, checklists, and symptom management handouts/strategies) to support consistency with their home exercise program and maintain their current level of independence with ADLs and IADLs, requiring no more than minimal cues during therapy sessions.   Goal status: INITIAL  2.  Pt will be able to place at least 3 additional blocks using each hand with completion of Box and Blocks test.  Baseline: Right 41 blocks, Left 45 blocks Goal status: INITIAL   3.  Patient will demo improved FM coordination as evidenced by completing nine-hole peg with use of each hand in 30 seconds or less.  Baseline: Right: 36 sec; Left: 33 sec Goal status: INITIAL   ASSESSMENT:  CLINICAL IMPRESSION:  Patient is a 79 y.o. male who was seen today for occupational therapy evaluation for PD management. Hx includes CVD, CVA, HLD, PD, and history of falls. Patient presents to clinic seeking skilled therapy services to better manage occupational barriers  secondary to Parkinson's and improve independence and safety with ADLs and IADLs.     PLAN:  OT FREQUENCY: 1x/week  OT DURATION: 6 weeks  PLANNED INTERVENTIONS: 97168 OT Re-evaluation, 97535 self care/ADL training, 02889 therapeutic exercise, 97530 therapeutic activity, 97112 neuromuscular re-education, 97140 manual therapy, 97750 Physical Performance Testing, functional mobility training, coping strategies training, patient/family education, and DME and/or AE instructions  RECOMMENDED OTHER SERVICES: N/A for this visit  CONSULTED AND AGREED WITH PLAN OF CARE: Patient  PLAN FOR NEXT SESSION: review PD binder and fine tune, coordination, jacket donning/doffing  Jocelyn CHRISTELLA Bottom, OT 10/10/2024, 3:58 PM   "

## 2024-10-22 ENCOUNTER — Ambulatory Visit: Admitting: Physical Therapy

## 2024-11-07 ENCOUNTER — Ambulatory Visit: Admitting: Physical Therapy

## 2024-11-07 ENCOUNTER — Ambulatory Visit: Admitting: Occupational Therapy

## 2024-11-13 ENCOUNTER — Ambulatory Visit: Admitting: Occupational Therapy

## 2024-11-13 ENCOUNTER — Ambulatory Visit: Admitting: Physical Therapy

## 2024-11-20 ENCOUNTER — Ambulatory Visit: Admitting: Physical Therapy

## 2024-11-20 ENCOUNTER — Ambulatory Visit: Admitting: Occupational Therapy

## 2024-11-27 ENCOUNTER — Ambulatory Visit: Admitting: Physical Therapy

## 2024-11-27 ENCOUNTER — Ambulatory Visit: Admitting: Occupational Therapy

## 2025-02-14 ENCOUNTER — Ambulatory Visit: Admitting: Neurology
